# Patient Record
Sex: Female | Born: 1955 | Race: White
Health system: Northeastern US, Academic
[De-identification: ages and names within clinical notes are randomized; demographics above are authoritative.]

## PROBLEM LIST (undated history)

## (undated) DIAGNOSIS — M549 Dorsalgia, unspecified: Secondary | ICD-10-CM

## (undated) DIAGNOSIS — M48061 Spinal stenosis, lumbar region without neurogenic claudication: Secondary | ICD-10-CM

## (undated) DIAGNOSIS — G629 Polyneuropathy, unspecified: Secondary | ICD-10-CM

## (undated) DIAGNOSIS — M109 Gout, unspecified: Secondary | ICD-10-CM

## (undated) DIAGNOSIS — L570 Actinic keratosis: Secondary | ICD-10-CM

## (undated) DIAGNOSIS — K219 Gastro-esophageal reflux disease without esophagitis: Secondary | ICD-10-CM

## (undated) DIAGNOSIS — K5790 Diverticulosis of intestine, part unspecified, without perforation or abscess without bleeding: Secondary | ICD-10-CM

## (undated) DIAGNOSIS — B019 Varicella without complication: Secondary | ICD-10-CM

## (undated) DIAGNOSIS — B009 Herpesviral infection, unspecified: Secondary | ICD-10-CM

## (undated) DIAGNOSIS — G43909 Migraine, unspecified, not intractable, without status migrainosus: Secondary | ICD-10-CM

## (undated) DIAGNOSIS — J8410 Pulmonary fibrosis, unspecified: Secondary | ICD-10-CM

## (undated) DIAGNOSIS — T8859XA Other complications of anesthesia, initial encounter: Secondary | ICD-10-CM

## (undated) DIAGNOSIS — D649 Anemia, unspecified: Secondary | ICD-10-CM

## (undated) DIAGNOSIS — J329 Chronic sinusitis, unspecified: Secondary | ICD-10-CM

## (undated) DIAGNOSIS — L709 Acne, unspecified: Secondary | ICD-10-CM

## (undated) DIAGNOSIS — A64 Unspecified sexually transmitted disease: Secondary | ICD-10-CM

## (undated) DIAGNOSIS — K589 Irritable bowel syndrome without diarrhea: Secondary | ICD-10-CM

## (undated) DIAGNOSIS — L509 Urticaria, unspecified: Secondary | ICD-10-CM

## (undated) DIAGNOSIS — Z789 Other specified health status: Secondary | ICD-10-CM

## (undated) DIAGNOSIS — F32A Depression, unspecified: Secondary | ICD-10-CM

## (undated) HISTORY — DX: Irritable bowel syndrome, unspecified: K58.9

## (undated) HISTORY — DX: Migraine, unspecified, not intractable, without status migrainosus: G43.909

## (undated) HISTORY — DX: Varicella without complication: B01.9

## (undated) HISTORY — DX: Other complications of anesthesia, initial encounter: T88.59XA

## (undated) HISTORY — DX: Diverticulosis of intestine, part unspecified, without perforation or abscess without bleeding: K57.90

## (undated) HISTORY — DX: Depression, unspecified: F32.A

## (undated) HISTORY — DX: Urticaria, unspecified: L50.9

## (undated) HISTORY — DX: Polyneuropathy, unspecified: G62.9

## (undated) HISTORY — DX: Herpesviral infection, unspecified: B00.9

## (undated) HISTORY — DX: Chronic sinusitis, unspecified: J32.9

## (undated) HISTORY — DX: Acne, unspecified: L70.9

## (undated) HISTORY — DX: Pulmonary fibrosis, unspecified: J84.10

## (undated) HISTORY — DX: Dorsalgia, unspecified: M54.9

## (undated) HISTORY — DX: Unspecified sexually transmitted disease: A64

## (undated) HISTORY — DX: Anemia, unspecified: D64.9

## (undated) HISTORY — DX: Gout, unspecified: M10.9

## (undated) HISTORY — DX: Spinal stenosis, lumbar region without neurogenic claudication: M48.061

## (undated) HISTORY — DX: Gastro-esophageal reflux disease without esophagitis: K21.9

## (undated) HISTORY — DX: Actinic keratosis: L57.0

## (undated) HISTORY — DX: Other specified health status: Z78.9

## (undated) NOTE — Progress Notes (Signed)
Formatting of this note is different from the original.  Images from the original note were not included.      PREOPERATIVE H&P    NAME: [name] MRN: [id number]    DOB: 17-May-1956 AGE: 72 y.o. SEX: female   Date of Surgery:  03/11/2021  Facility:   [facility]  Procedure:   Left partial mastectomy, sentinel lymph node biopsy  Diagnosis:   Left breast invasive ductal carcinoma  Staging:   cT1 Nx M0 G1, ER positive, PR positive, HER2 equivocal  ______________________________________________________________________    Patient comes in for scheduled follow-up visit.  She she is living in [place].  In the interim her radiation went well.    Adjuvant treatment: Whole breast radiation for 4 weeks.  Endocrine therapy with tamoxifen    Arm: No swelling, cellulitis, or change in motion  Breast: No new masses, tenderness, skin change, or drainage.    Medications: Tamoxifen for which she reports night sweats  Hospitalizations: None report since her last visit  X-ray and labs: She is due for mammogram 02/12/2022 [facility].  DEXA scan has been done in [place] showing osteopenia  Family history: Unchanged since her last visit  Review of systems: 12 point review of systems updated.    Exam:  Patient is examined sitting and supine  The left breast has some mild edema present.  There is a well-healed curvilinear incision in the upper outer quadrant.  She has no palpable breast masses.  She has a well-healed left axillary incision without any masses or lymphadenopathy.  She has good range of motion to her left arm without any lymphedema.  The right breast has no palpable masses.  The right axilla is without masses or lymphadenopathy.  Neck: No cervical lymphadenopathy or thyroid nodules noted  Abdomen is soft without masses, tenderness, or hepatosplenomegaly.    Assessment:  No evidence of any recurrent left breast carcinoma  No left upper extremity lymphedema.  Normal clinical breast exam  right  Osteopenia by DEXA scan    Plan:  Monthly self examination  Annual mammogram  Lymphedema prevention strategies reviewed.  Osteoporosis prevention techniques were discussed.  We talked about vitamin D supplementation with weightbearing exercise  I have recommended vitamin E 800 units daily to minimize chronic radiation fibrosis  Follow-up in 1 years time.    Lyndle Herrlich, MD    Electronically signed by Lyndle Herrlich, MD at 02/07/2022  6:43 AM EDT

## (undated) NOTE — Progress Notes (Signed)
Formatting of this note might be different from the original.  Review of Systems   Constitutional: Negative.  Negative for chills, fever and weight loss.   HENT: Negative.  Negative for sore throat.    Respiratory: Positive for cough. Negative for shortness of breath.         Had covid recently   Cardiovascular: Negative.  Negative for chest pain.   Gastrointestinal: Positive for nausea. Negative for abdominal pain, blood in stool and vomiting.   Genitourinary: Negative.  Negative for hematuria.   Musculoskeletal: Negative.  Negative for falls.   Neurological: Positive for headaches. Negative for dizziness.   Endo/Heme/Allergies: Bruises/bleeds easily.   Psychiatric/Behavioral: Negative.  Negative for depression.     Last mammogram (if indicated):  Last colonoscopy: 09/2021    Winferd Humphrey, LPN  Electronically signed by Lyndle Herrlich, MD at 02/02/2022 12:29 PM EDT

---

## 2001-05-31 HISTORY — PX: NASAL POLYP SURGERY: SHX186

## 2005-02-16 DIAGNOSIS — K589 Irritable bowel syndrome without diarrhea: Secondary | ICD-10-CM | POA: Insufficient documentation

## 2005-02-16 DIAGNOSIS — L708 Other acne: Secondary | ICD-10-CM | POA: Insufficient documentation

## 2005-02-16 DIAGNOSIS — K219 Gastro-esophageal reflux disease without esophagitis: Secondary | ICD-10-CM | POA: Insufficient documentation

## 2005-02-16 DIAGNOSIS — K648 Other hemorrhoids: Secondary | ICD-10-CM | POA: Insufficient documentation

## 2005-02-16 DIAGNOSIS — J309 Allergic rhinitis, unspecified: Secondary | ICD-10-CM | POA: Insufficient documentation

## 2005-02-16 DIAGNOSIS — F3289 Other specified depressive episodes: Secondary | ICD-10-CM | POA: Insufficient documentation

## 2005-02-16 DIAGNOSIS — F411 Generalized anxiety disorder: Secondary | ICD-10-CM | POA: Insufficient documentation

## 2005-02-16 DIAGNOSIS — M25569 Pain in unspecified knee: Secondary | ICD-10-CM | POA: Insufficient documentation

## 2005-02-16 DIAGNOSIS — M26609 Unspecified temporomandibular joint disorder, unspecified side: Secondary | ICD-10-CM | POA: Insufficient documentation

## 2005-06-20 DIAGNOSIS — K3184 Gastroparesis: Secondary | ICD-10-CM | POA: Insufficient documentation

## 2007-06-01 HISTORY — PX: ENDOMETRIAL ABLATION: SHX621

## 2008-05-31 HISTORY — PX: INCONTINENCE SURGERY: SHX676

## 2008-05-31 HISTORY — PX: OTHER SURGICAL HISTORY: SHX169

## 2008-05-31 HISTORY — PX: RECTOCELE REPAIR: SHX761

## 2008-07-15 LAB — HM COLONOSCOPY

## 2009-03-12 ENCOUNTER — Ambulatory Visit
Admit: 2009-03-12 | Discharge: 2009-03-12 | Disposition: A | Discharge status: Home / Self Care | Payer: Self-pay | Source: Ambulatory Visit | Attending: Primary Care | Admitting: Primary Care

## 2009-03-12 DIAGNOSIS — G43909 Migraine, unspecified, not intractable, without status migrainosus: Secondary | ICD-10-CM | POA: Insufficient documentation

## 2009-05-14 ENCOUNTER — Encounter: Payer: Self-pay | Admitting: Gastroenterology

## 2009-05-15 ENCOUNTER — Ambulatory Visit: Payer: Self-pay | Admitting: Primary Care

## 2009-05-15 NOTE — Progress Notes (Addendum)
Reason For Visit   Sore throat.  HPI   She has had scratchy throat and postnasal drip on and off the past 2 weeks.    Yesterday was a little worse.  Her grandkids have had colds.  She denies   any fever, chills, cough, chest pain, wheezing, shortness of breath,   nausea, vomiting, diarrhea, or increased reflux symptoms.  She is currently   not taking Flovent or Zyrtec.  Allergies   Demerol TABS  Erythromycin Derivatives; Adverse Reaction  Sulfa Drugs.  Current Meds   Flovent HFA 110 MCG/ACT Aerosol;INHALE 2 PUFFS TWICE DAILY; Rx  Multivitamins CAPS;TAKE 1 CAPSULE DAILY.; RPT  Calcium + D 600-200 MG-UNIT Tablet;one tablet twice daily; RPT  Vitamin D 1000 UNIT Tablet;TAKE 1 TABLET DAILY.; RPT  B Complex 1 Tablet;TAKE 1 TABLET DAILY.; RPT  Magnesium 200 MG Tablet;TAKE 1 TABLET DAILY AS DIRECTED.; RPT  Aleve 220 MG Tablet;TAKE 1 TABLET DAILY PRN; RPT  GuaiFENesin 400 MG Tablet;TAKE 1 TABLET EVERY 4 HOURS AS NEEDED.; RPT  Ziana 1.2-0.025 % Gel;; RPT  Valacyclovir HCl 1 GM Tablet;; RPT  SUMAtriptan Succinate 100 MG Tablet;TAKE 1 TABLET FOR MIGRAINE RELIEF. MAY   REPEAT 2 HOURS LATER. MAXIMUM 200MG /DAY.; Rx  Dexilant 60 MG Capsule Delayed Release;TAKE 1 CAPSULE DAILY EVERY MORNING   BEFORE BREAKFAST.; Rx  Butalbital-APAP-Caffeine 50-325-40 MG Tablet;1 OR 2 TABLETS EVERY 4 TO 6   HOURS AS NEEDED FOR HEADACHE.; Rx  CeleBREX 200 MG Capsule;; RPT  Sucralfate 1 GM Tablet;TAKE 1 TABLET 4 TIMES DAILY PRN; Rx  Zolpidem Tartrate 10 MG Tablet;Take 1/2 to 1 tablet at bedtime prn sleep; Rx  Spironolactone 50 MG Tablet;TAKE 1 TABLET 3 TIMES DAILY; Rx.  ** Medication reconciliation completed. ** by Ilene Qua MPAS RPA-C.  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  Vital Signs   Recorded by Fernand Parkins on 15 May 2009 08:59 AM  BP:110/76,  RUE,  Sitting,   HR: 72 b/min,   Temp: 97.9 F,  Oral,   Weight: 129.0 lb.  Physical Exam   General:  Alert, cooperative, nontoxic, and in NAD.  Head:  Atraumatic, normocephelic, symmetrical without deformities. Sinuses   nontender.  Eyes: PERRLA EOMI bilaterally.  No conjunctival injection, ptosis, or   sclerus icterus.   Ears:  External ear exam without abnormalities. External ear canal is   patent. TMs are intact  without injection.  Nose:  Nares patent without discharge  Mouth/Throat: Gums pink without bleeding, MMM, teeth in good condition,   uvula midline, no pharyngeal  hyperemia or exudates.  Neck:   No cervical lymphadenopathy. Supple. Nontender.  Lungs:  CTA without wheezes, rales, or rhonchi.    Heart:  S1 and S2 are noted. RRR. No murmurs, rubs, or gallops.    Ext:  No clubbing, cyanosis, edema   .  POCT   RAPID STREP RESULT:  May 15, 2009   9:23AM        Rapid Antigen:  Negative  .  Assessment   Acute pharyngitis.  Plan   Likely related to allergy.  Will send confirmatory throat culture.  Will   have her start Zyrtec over-the-counter daily.  She will call if her   symptoms worsen  or don't improve.  Signature   Electronically signed by: Berton Lan  R.P.A.C.; 05/15/2009 9:24 AM EST.  Electronically signed by: Arlyss Queen  M.D.; 05/15/2009 1:23 PM EST;   Acknowledgement.

## 2009-05-17 LAB — STREP A CULTURE, THROAT

## 2009-07-02 ENCOUNTER — Ambulatory Visit: Payer: Self-pay | Admitting: Primary Care

## 2009-07-05 NOTE — Progress Notes (Signed)
 Reason For Visit   Anxiety.  HPI   The patient is a generally healthy 54 year old woman who has a history of   irritable bowel syndrome and esophageal reflux disease.  She comes in today   complaining of increasing anxiety and depression.  She is under a great   deal of stress as her daughter is going through a custody battle for her   children.  The patient had been helping to care for her grandchildren but   they are now down in [place] living 2 weeks out of the month with their   father.  The patient is concerned as she feels that their father is not a   suitable caregiver.  Unfortunately, the courts have given him joint   custody.  She is sleeping poorly.  She has tried SSRIs in the past with   variable success.  Allergies   Demerol TABS  Erythromycin Derivatives; Adverse Reaction  Sulfa Drugs.  Current Meds   Flovent HFA 110 MCG/ACT Aerosol;INHALE 2 PUFFS TWICE DAILY; Rx  Multivitamins CAPS;TAKE 1 CAPSULE DAILY.; RPT  Calcium + D 600-200 MG-UNIT Tablet;one tablet twice daily; RPT  Vitamin D 1000 UNIT Tablet;TAKE 1 TABLET DAILY.; RPT  B Complex 1 Tablet;TAKE 1 TABLET DAILY.; RPT  Magnesium 200 MG Tablet;TAKE 1 TABLET DAILY AS DIRECTED.; RPT  Aleve 220 MG Tablet;TAKE 1 TABLET DAILY PRN; RPT  GuaiFENesin 400 MG Tablet;TAKE 1 TABLET EVERY 4 HOURS AS NEEDED.; RPT  Ziana 1.2-0.025 % Gel;; RPT  Valacyclovir HCl 1 GM Tablet;; RPT  SUMAtriptan Succinate 100 MG Tablet;TAKE 1 TABLET FOR MIGRAINE RELIEF. MAY   REPEAT 2 HOURS LATER. MAXIMUM 200MG /DAY.; Rx  Dexilant 60 MG Capsule Delayed Release;TAKE 1 CAPSULE DAILY EVERY MORNING   BEFORE BREAKFAST.; Rx  Butalbital-APAP-Caffeine 50-325-40 MG Tablet;1 OR 2 TABLETS EVERY 4 TO 6   HOURS AS NEEDED FOR HEADACHE.; Rx  CeleBREX 200 MG Capsule;; RPT  Sucralfate 1 GM Tablet;TAKE 1 TABLET 4 TIMES DAILY PRN; Rx  Zolpidem Tartrate 10 MG Tablet;Take 1/2 to 1 tablet at bedtime prn sleep; Rx  Spironolactone 50 MG Tablet;TAKE 1 TABLET 3 TIMES DAILY; Rx  Amoxicillin 500 MG Capsule;TAKE 1  CAPSULE 3 TIMES DAILY.; Rx  ClonazePAM 0.5 MG Tablet;TAKE 1 TABLET TWICE DAILY PRN; Rx.  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  Vital Signs   Recorded by Greig Right on 02 Jul 2009 04:11 PM  BP:106/78,  RUE,  Sitting,   HR: 80 b/min,  R Radial,   Weight: 130.5 lb.  Physical Exam   The patient is a pleasant 54 year old woman who is anxious and tearful.    She was not examined today.  Assessment   The patient presents with situational anxiety and depression regarding the   stress over the custody of her grandchildren.  They are now in [place] and   separated from her which she finds very difficult.  Plan   1.  She was counseled regarding her anxiety and stress.  2.  Clonazepam 0.5 mg twice a day as needed.  3.  Another trial of an SSRI will be considered if she is no better.  4.  She was encouraged to see a therapist.  5.  She will call  in 2 weeks regarding her progress.  Signature   Electronically signed by: Arlyss Queen  M.D.; 07/05/2009 10:12 AM EST.

## 2009-08-13 ENCOUNTER — Ambulatory Visit
Admit: 2009-08-13 | Discharge: 2009-08-13 | Disposition: A | Discharge status: Home / Self Care | Payer: Self-pay | Source: Ambulatory Visit | Attending: Gastroenterology | Admitting: Gastroenterology

## 2009-08-16 LAB — SURGICAL PATHOLOGY

## 2009-09-01 ENCOUNTER — Ambulatory Visit: Payer: Self-pay | Admitting: Primary Care

## 2009-09-05 ENCOUNTER — Ambulatory Visit: Payer: Self-pay | Admitting: Primary Care

## 2009-09-05 DIAGNOSIS — R209 Unspecified disturbances of skin sensation: Secondary | ICD-10-CM | POA: Insufficient documentation

## 2009-09-06 NOTE — Progress Notes (Signed)
Reason For Visit   Paresthesias and follow-up of vertigo and headaches.  HPI   The patient was seen 4 days ago after an episode of vertigo with a migraine   headache.  She has not had any further episodes of vertigo and her headache   is gone.  However, she now complains of numbness and tingling of her arms   and legs.  Her tingling first started in the left arm and then went to her   legs and now is in her right arm.  She has no associated weakness.  She   denies any neck pain.  She does feel more tired.  She has been under   chronic stress over custody issues with her grandchildren.  She is on   Lexapro which is helping.  She has occasional blurred vision and she did   have one brief episode of double vision.  Allergies   Demerol TABS  Erythromycin Derivatives; Adverse Reaction  Sulfa Drugs.  Current Meds   Multivitamins CAPS;TAKE 1 CAPSULE DAILY.; RPT  Calcium + D 600-200 MG-UNIT Tablet;one tablet twice daily; RPT  Vitamin D 1000 UNIT Tablet;TAKE 1 TABLET DAILY.; RPT  B Complex 1 Tablet;TAKE 1 TABLET DAILY.; RPT  Magnesium 200 MG Tablet;TAKE 1 TABLET DAILY AS DIRECTED.; RPT  Aleve 220 MG Tablet;TAKE 1 TABLET DAILY PRN; RPT  GuaiFENesin 400 MG Tablet;TAKE 1 TABLET EVERY 4 HOURS AS NEEDED.; RPT  Ziana 1.2-0.025 % Gel;; RPT  Valacyclovir HCl 1 GM Tablet;; RPT  Spironolactone 50 MG Tablet;TAKE 1 TABLET 3 TIMES DAILY; Rx  Flovent HFA 110 MCG/ACT Aerosol;INHALE 2 PUFFS TWICE DAILY; Rx  Lexapro 10 MG Tablet;TAKE 1 TABLET DAILY.; Rx  ClonazePAM 0.5 MG Tablet;TAKE 1 TABLET TWICE DAILY PRN; Rx  Zolpidem Tartrate 10 MG Tablet;Take 1/2 to 1 tablet at bedtime prn sleep; Rx  Sucralfate 1 GM Tablet;TAKE 1 TABLET 4 TIMES DAILY PRN; Rx  CeleBREX 200 MG Capsule;; RPT  Butalbital-APAP-Caffeine 50-325-40 MG Tablet;1 OR 2 TABLETS EVERY 4 TO 6   HOURS AS NEEDED FOR HEADACHE.; Rx  Dexilant 60 MG Capsule Delayed Release;TAKE 1 CAPSULE DAILY EVERY MORNING   BEFORE BREAKFAST.; Rx  SUMAtriptan Succinate 100 MG Tablet;TAKE 1 TABLET FOR  MIGRAINE RELIEF. MAY   REPEAT 2 HOURS LATER. MAXIMUM 200MG /DAY.; Rx.  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  Vital Signs   Recorded by [facility] on 05 Sep 2009 04:50 PM  BP:120/80,  RUE,  Sitting,   HR: 80 b/min,  R Radial, Normal,   Height: 65.50 in, Weight: 130 lb, BMI: 21.3 kg/m2.  Physical Exam   The patient is a healthy-appearing 54 year old woman.  Her pupils are   equal, round and reactive to light.  Her discs are flat.  Cranial nerves II   through XII are intact.  Finger to nose testing is normal.  Motor strength   is normal.  Gait is normal.  Pinprick sensation over her arms was normal.    Her neck is nontender and supple.  Assessment   The patient presents with numbness and tingling of her extremities.  She is   neurologically intact and she has no associated weakness.  She earlier in   the  week had an episode of vertigo with her migraine headache.  She had one   brief episode of double vision.  An underlying demyelinating disorder is a   possibility.  Her symptoms also may be stress related.  Plan   1.  For now her numbness and tingling will be followed.  If her   paresthesias persist, she will be referred to neurology and an MRI scan of   her head will be considered.  2.  She is to call next week regarding her progress.  Signature   Electronically signed by: Arlyss Queen  M.D.; 09/06/2009 7:02 AM EST.

## 2009-09-16 ENCOUNTER — Ambulatory Visit
Admit: 2009-09-16 | Discharge: 2009-09-16 | Disposition: A | Discharge status: Home / Self Care | Payer: Self-pay | Source: Ambulatory Visit | Attending: Primary Care | Admitting: Primary Care

## 2009-09-16 LAB — COMPREHENSIVE METABOLIC PANEL
ALT: 25 U/L (ref 0–35)
AST: 32 U/L (ref 0–35)
Albumin: 4.5 g/dL (ref 3.5–5.2)
Alk Phos: 43 U/L (ref 35–105)
Anion Gap: 9 (ref 7–16)
Bilirubin,Total: 0.3 mg/dL (ref 0.0–1.2)
CO2: 27 mmol/L (ref 20–28)
Calcium: 9.6 mg/dL (ref 8.6–10.2)
Chloride: 97 mmol/L (ref 96–108)
Creatinine: 0.68 mg/dL (ref 0.51–0.95)
GFR,Black: >59 *
GFR,Caucasian: >59 *
Glucose: 100 mg/dL (ref 74–106)
Lab: 14 mg/dL (ref 6–20)
Potassium: 4.5 mmol/L (ref 3.3–5.1)
Sodium: 133 mmol/L (ref 133–145)
Total Protein: 7 g/dL (ref 6.3–7.7)

## 2009-09-16 LAB — T4, FREE: Free T4: 0.9 ng/dL (ref 0.9–1.7)

## 2009-09-16 LAB — T3: T3: 67 ng/dL — ABNORMAL LOW (ref 80–200)

## 2009-09-16 LAB — CBC
Hematocrit: 40 % (ref 34–45)
Hemoglobin: 13.4 g/dL (ref 11.2–15.7)
MCV: 95 fL (ref 79–95)
Platelets: 257 THOU/uL (ref 160–370)
RBC: 4.2 MIL/uL (ref 3.9–5.2)
RDW: 13.5 % (ref 11.7–14.4)
WBC: 9 THOU/uL (ref 4.0–10.0)

## 2009-09-16 LAB — VITAMIN B12: Vitamin B12: 1399 pg/mL — ABNORMAL HIGH (ref 211–946)

## 2009-09-16 LAB — TSH: TSH: 2.18 u[IU]/mL (ref 0.27–4.20)

## 2009-09-16 LAB — SEDIMENTATION RATE, AUTOMATED: Sedimentation Rate: 7 mm/h (ref 0–30)

## 2009-09-24 ENCOUNTER — Ambulatory Visit: Payer: Self-pay | Admitting: Primary Care

## 2009-11-20 ENCOUNTER — Ambulatory Visit
Admit: 2009-11-20 | Discharge: 2009-11-20 | Disposition: A | Discharge status: Home / Self Care | Payer: Self-pay | Source: Ambulatory Visit | Attending: Primary Care | Admitting: Primary Care

## 2009-11-20 LAB — CBC AND DIFFERENTIAL
Baso # K/uL: 0.1 THOU/uL (ref 0.0–0.1)
Basophil %: 0.4 % (ref 0.1–1.2)
Eos # K/uL: 0.1 THOU/uL (ref 0.0–0.4)
Eosinophil %: 0.7 % (ref 0.7–5.8)
Hematocrit: 37 % (ref 34–45)
Hemoglobin: 12.4 g/dL (ref 11.2–15.7)
Lymph # K/uL: 1.3 THOU/uL (ref 1.2–3.7)
Lymphocyte %: 10 % — ABNORMAL LOW (ref 19.3–51.7)
MCV: 96 fL — ABNORMAL HIGH (ref 79–95)
Mono # K/uL: 0.6 THOU/uL (ref 0.2–0.9)
Monocyte %: 4.6 % — ABNORMAL LOW (ref 4.7–12.5)
Neut # K/uL: 10.7 THOU/uL — ABNORMAL HIGH (ref 1.6–6.1)
Platelets: 233 THOU/uL (ref 160–370)
RBC: 3.8 MIL/uL — ABNORMAL LOW (ref 3.9–5.2)
RDW: 13.5 % (ref 11.7–14.4)
Seg Neut %: 84.3 % — ABNORMAL HIGH (ref 34.0–71.1)
WBC: 12.7 THOU/uL — ABNORMAL HIGH (ref 4.0–10.0)

## 2009-11-20 LAB — COMPREHENSIVE METABOLIC PANEL
ALT: 20 U/L (ref 0–35)
AST: 22 U/L (ref 0–35)
Albumin: 3.9 g/dL (ref 3.5–5.2)
Alk Phos: 35 U/L (ref 35–105)
Anion Gap: 7 (ref 7–16)
Bilirubin,Total: 0.4 mg/dL (ref 0.0–1.2)
CO2: 29 mmol/L — ABNORMAL HIGH (ref 20–28)
Calcium: 8.6 mg/dL (ref 8.6–10.2)
Chloride: 97 mmol/L (ref 96–108)
Creatinine: 0.72 mg/dL (ref 0.51–0.95)
GFR,Black: >59 *
GFR,Caucasian: >59 *
Glucose: 77 mg/dL (ref 74–106)
Lab: 8 mg/dL (ref 6–20)
Potassium: 3.7 mmol/L (ref 3.3–5.1)
Sodium: 133 mmol/L (ref 133–145)
Total Protein: 6.1 g/dL — ABNORMAL LOW (ref 6.3–7.7)

## 2009-11-20 LAB — SEDIMENTATION RATE, AUTOMATED: Sedimentation Rate: 11 mm/h (ref 0–30)

## 2009-11-20 LAB — CK: CK: 246 U/L — ABNORMAL HIGH (ref 34–145)

## 2009-11-20 LAB — CRP: CRP: 85 mg/L — ABNORMAL HIGH (ref 0–10)

## 2009-11-24 LAB — VITAMIN D
25-OH VIT D2: <4 ng/mL
25-OH VIT D3: 53 ng/mL
25-OH Vit Total: 53 ng/mL (ref 30–80)

## 2009-12-12 ENCOUNTER — Ambulatory Visit
Admit: 2009-12-12 | Discharge: 2009-12-12 | Disposition: A | Discharge status: Home / Self Care | Payer: Self-pay | Source: Ambulatory Visit | Attending: Primary Care | Admitting: Primary Care

## 2009-12-12 LAB — CK: CK: 321 U/L — ABNORMAL HIGH (ref 34–145)

## 2009-12-12 LAB — CBC AND DIFFERENTIAL
Baso # K/uL: 0.1 THOU/uL (ref 0.0–0.1)
Basophil %: 1 % (ref 0.1–1.2)
Eos # K/uL: 0.4 THOU/uL (ref 0.0–0.4)
Eosinophil %: 4.3 % (ref 0.7–5.8)
Hematocrit: 42 % (ref 34–45)
Hemoglobin: 13.7 g/dL (ref 11.2–15.7)
Lymph # K/uL: 2.3 THOU/uL (ref 1.2–3.7)
Lymphocyte %: 28.3 % (ref 19.3–51.7)
MCV: 98 fL — ABNORMAL HIGH (ref 79–95)
Mono # K/uL: 0.5 THOU/uL (ref 0.2–0.9)
Monocyte %: 6.6 % (ref 4.7–12.5)
Neut # K/uL: 4.8 THOU/uL (ref 1.6–6.1)
Platelets: 326 THOU/uL (ref 160–370)
RBC: 4.3 MIL/uL (ref 3.9–5.2)
RDW: 13.5 % (ref 11.7–14.4)
Seg Neut %: 59.8 % (ref 34.0–71.1)
WBC: 8.1 THOU/uL (ref 4.0–10.0)

## 2009-12-12 LAB — SEDIMENTATION RATE, AUTOMATED: Sedimentation Rate: 10 mm/h (ref 0–30)

## 2009-12-12 LAB — CRP: CRP: 1 mg/L (ref 0–10)

## 2009-12-15 LAB — ANCA SCREEN: ANCA Screen: NEGATIVE

## 2009-12-25 ENCOUNTER — Ambulatory Visit: Payer: Self-pay | Admitting: Primary Care

## 2010-01-04 NOTE — Progress Notes (Signed)
Reason For Visit   Lateral forearm pain and cramping of her toes and feet.  HPI   The patient has had tingling of her arms and legs over the last 3-4 months.    In April she did have normal nerve conduction studies.  No etiology has   been found for her paresthesias.  Her workup has been negative except for   an elevated CK level.  She does feel that the tingling in her arms is   getting better.     However, she now has pain over her lateral forearms that radiates into her   fingers and she has cramping of her toes, feet and lower legs.  She has   trouble exercising because of pain.  Her energy level is decreased and she   tires easily.  Her muscles feel tight but she does try to do daily   stretching exercises.  She tapered off of gabapentin because of drowsiness.    She is concerned about her symptoms as there is a family history of   fascioscapulohumeral muscular dystrophy.  Allergies   Demerol TABS  Erythromycin Derivatives; Adverse Reaction  Sulfa Drugs.  Current Meds   Multivitamins CAPS;TAKE 1 CAPSULE DAILY.; RPT  Calcium + D 600-200 MG-UNIT Tablet;one tablet twice daily; RPT  Vitamin D 1000 UNIT Tablet;TAKE 1 TABLET DAILY.; RPT  B Complex 1 TABS;TAKE 1 TABLET DAILY.; RPT  Magnesium 200 MG Tablet;TAKE 1 TABLET DAILY AS DIRECTED.; RPT  Aleve 220 MG Tablet;TAKE 1 TABLET DAILY PRN; RPT  GuaiFENesin 400 MG Tablet;TAKE 1 TABLET EVERY 4 HOURS AS NEEDED.; RPT  Ziana 1.2-0.025 % Gel;; RPT  Valacyclovir HCl 1 GM Tablet;; RPT  SUMAtriptan Succinate 100 MG Tablet;TAKE 1 TABLET FOR MIGRAINE RELIEF. MAY   REPEAT 2 HOURS LATER. MAXIMUM 200MG /DAY.; Rx  Dexilant 60 MG Capsule Delayed Release;TAKE 1 CAPSULE DAILY EVERY MORNING   BEFORE BREAKFAST.; Rx  Butalbital-APAP-Caffeine 50-325-40 MG Tablet;1 OR 2 TABLETS EVERY 4 TO 6   HOURS AS NEEDED FOR HEADACHE.; Rx  CeleBREX 200 MG Capsule;; RPT  Sucralfate 1 GM Tablet;TAKE 1 TABLET 4 TIMES DAILY PRN; Rx  Zolpidem Tartrate 10 MG Tablet;Take 1/2 to 1 tablet at bedtime prn sleep;  Rx  Spironolactone 50 MG Tablet;TAKE 1 TABLET 3 TIMES DAILY; Rx  ClonazePAM 0.5 MG Tablet;TAKE 1 TABLET TWICE DAILY PRN; Rx  NexIUM 40 MG Capsule Delayed Release;; RPT  Mometasone Furoate 0.1 % Cream;; RPT  Aczone 5 % Gel;; RPT  Pantoprazole Sodium 40 MG Tablet Delayed Release;TAKE 1 TABLET DAILY.; Rx  Flovent HFA 110 MCG/ACT Aerosol;INHALE 2 PUFFS TWICE DAILY; Rx  Gabapentin 100 MG Capsule;ONE CAPSULE AT BEDTIME AND MAY INCREASE TO TWO AT   BEDTIME IF NECESSARY; Rx  Lexapro 10 MG Tablet;TAKE 1 TABLET DAILY.; Rx.  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  Vital Signs   Recorded by RACE,JENNIFER on 25 Dec 2009 01:10 PM  BP:116/80,  LUE,  Sitting,   HR: 72 b/min,  L Radial,   Height: 65.5 in, Weight: 126.5 lb, BMI: 20.7 kg/m2.  Physical Exam   The patient is a healthy-appearing 54 year old woman.  Motor strength is  normal.  She has no focal tenderness.  Assessment   The patient is a 54 year old woman who initially presented with   paresthesias of her arms and legs.  She now has pain in her forearms with   cramping of her toes, feet and lower legs.  Her workup has been negative   except for an elevated CK level.  She does have a family history of   muscular dystrophy.  Plan   1.  Second neurology opinion.  2.  She will re-start gabapentin at 100 mg one at nighttime.  3.  Her CK level will be followed.  4.  She is to return after she sees the neurologist.  Signature   Electronically signed by: Arlyss Queen  M.D.; 01/04/2010 11:23 AM EST.

## 2010-04-08 ENCOUNTER — Ambulatory Visit
Admit: 2010-04-08 | Discharge: 2010-04-08 | Disposition: A | Discharge status: Home / Self Care | Payer: Self-pay | Source: Ambulatory Visit | Attending: Surgery | Admitting: Surgery

## 2010-04-08 LAB — TIBC
Iron: 109 ug/dL (ref 34–165)
TIBC: 288 ug/dL (ref 250–450)
Transferrin Saturation: 38 % (ref 15–50)

## 2010-04-08 LAB — FERRITIN: Ferritin: 50 ng/mL (ref 10–120)

## 2010-04-08 LAB — T4, FREE: Free T4: 0.9 ng/dL (ref 0.9–1.7)

## 2010-04-08 LAB — TSH: TSH: 1.7 u[IU]/mL (ref 0.27–4.20)

## 2010-04-08 LAB — VITAMIN B12: Vitamin B12: 1056 pg/mL — ABNORMAL HIGH (ref 211–946)

## 2010-04-08 LAB — T3: T3: 76 ng/dL — ABNORMAL LOW (ref 80–200)

## 2010-04-08 LAB — FOLATE: Folate: 10.2 ng/mL (ref >=4.6)

## 2010-04-09 LAB — DHEA-SULFATE: DHEA Sulfate: 96 ug/dL (ref 26–200)

## 2010-04-10 LAB — VITAMIN D
25-OH VIT D2: <4 ng/mL
25-OH VIT D3: 62 ng/mL
25-OH Vit Total: 62 ng/mL (ref 30–80)

## 2010-06-05 ENCOUNTER — Ambulatory Visit: Payer: Self-pay | Admitting: Obstetrics and Gynecology

## 2010-06-08 NOTE — Miscellaneous (Unsigned)
 Continuity of Care Record  Created: todo  From: Arlyss Queen  From:   From: [employer], EHR v10.2.7.53  To: Deeann Cree  Purpose: Patient Use;       Problems  Diagnosis: Acne (706.1)   Diagnosis: Allergic Rhinitis (477.9)   Diagnosis: Anxiety (300.00)   Diagnosis: Arthritis (716.90)   Diagnosis: Asthma (493.90)   Problem: History of Back Pain Resolved  Diagnosis: Chronic Constipation (564.00)   Diagnosis: Depression (311)   Diagnosis: Esophageal Reflux (530.81)   Problem: Family history of Fascioscapulohumeral Progressive Muscular   Dystrophy  Diagnosis: Gastroparesis (536.3)   Diagnosis: Internal Hemorrhoids (455.0)   Diagnosis: Irritable Bowel Syndrome (564.1)   Diagnosis: Joint Pain, Localized In The Knee (719.46)   Diagnosis: Migraine Headache (346.90)   Diagnosis: Temporomandibular Joint-pain Dysfunction Syndrome; Right   (524.60)   Diagnosis: Tingling (Paresthesia) (782.0)   Diagnosis: Urticaria (708.9)     Family History  Family history of Breast Cancer (V16.3)   Family history of Diabetes Mellitus (V18.0)   Family history of Fascioscapulohumeral Progressive Muscular Dystrophy  Family history of Heart Disease (V17.49)   Family history of Hypertension (V17.49)   Family history of Stroke Syndrome (V17.1)   Family history of Venous Thrombosis  Paternal history of Wegener's Granulomatosis    Social History  Marital History - Currently Married  Occupation:  Alcohol Use  Sexually Active  No History of Tobacco Use  No History of Drug Use  No History of Physical Abuse  No History of Sexually Abused    Alerts  Allergy - Demerol TABS   AdverseReaction - Erythromycin Derivatives   Allergy - Sulfa Drugs     Medications  Adrenal Complex CAPS; TAKE AS DIRECTED. ; RPT   B Complex 1 TABS; TAKE 1 TABLET DAILY. ; RPT   DHEA 25 MG Capsule; TAKE AS DIRECTED. ; RPT   Escitalopram Oxalate 20 MG Tablet; TAKE 1 TABLET DAILY. ; Rx   HydrOXYzine HCl 25 MG Tablet ; RPT   Lexapro 10 MG Tablet;  TAKE 1 TABLET DAILY. ; Rx   Lidocaine Viscous 2 % Solution ; RPT   Lyrica 25 MG Capsule ; RPT   Magnesium 200 MG Tablet; TAKE 1 TABLET DAILY AS DIRECTED. ; RPT   Metoclopramide HCl 5 MG Tablet ; RPT   Pantoprazole Sodium 40 MG Tablet Delayed Release; TAKE 1 TABLET DAILY. ; Rx   Progesterone Micronized 200 MG Capsule ; RPT   Prometrium 100 MG Capsule ; RPT   Valtrex 500 MG Tablet; TAKE 1 TABLET DAILY. ; RPT     Immunizations  Td   Influenza   Influenza   Influenza

## 2010-08-14 ENCOUNTER — Ambulatory Visit: Payer: Self-pay | Admitting: Obstetrics and Gynecology

## 2010-08-28 ENCOUNTER — Encounter: Payer: Self-pay | Admitting: Obstetrics and Gynecology

## 2010-08-28 ENCOUNTER — Ambulatory Visit: Payer: Self-pay | Admitting: Obstetrics and Gynecology

## 2010-08-28 NOTE — Progress Notes (Addendum)
 Reason For Visit   Evaluation of a rectocele and stress urinary incontinence.  HPI   Dr. Freddi Che, NP     [name] is a 55 year old G2 P2 Caucasian woman presenting for   an initial office evaluation of dyspareunia, stress urinary incontinence   and fecal incontinence.  She has been having these symptoms for several   years.  The most bothersome problem is fecal incontinence.  She reports   having increased fecal incontinence since reconstructive pelvic surgery in   09-14-08.  She has passed formed stool without sensation.  She reports having   regular bowel movements on a daily basis.  Later in the day, she has gone   to the restroom and noticed the fecal incontinence.  This occurs almost on   a daily basis.  Regarding the dyspareunia, she has pain at the vaginal introitus with   insertion and intercourse.  She is currently going to physical therapy with   improvement.  The most tender region is the posterior vaginal wall.  In   addition, she complains of a superficial vaginal irritation.  She has had   this problem since Sep 14, 2008.  Leakage of urine with activity occurs once or twice a week.  She has   associated the incontinence episodes with walking.  She feels as if she can   hold her urine if needed.  She has the sensation of incomplete bladder   emptying secondary to the urge to void shortly after a primary void.  She   does not have frequent urinary tract infections and considers urinary   incontinence the least bothersome of all her complaints.  Of note, in May of 2010 she had a TOT, posterior colporrhaphy, external   sphincteroplasty and a cystoscopy.  Voiding Diary   Voiding Diary: Completed and represented a normal day   Number of Day Voids: 18  Number of Night Voids: 1  Voided volumes:3 ounces to 21 ounces Typical voided volumes:8 ounces  Void Intervals: one hour  Total Intake: 127 ounces  Total Void: 150 ounces  Urge at void: yes  Leakage: not documented  24 hr Pad weights: not  done.  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  PMH   Back Pain Resolved.  PSH   Anal Sphincteroplasty 24 Oct 2008  Rectocele Repair 24 Oct 2008  Skin Debridement Genitalia 07 Nov 2008; vulvar at site of previous   posterior repair/perineoplasty  Vaginal Sling Operation For Stress Incontinence 24 Oct 2008; transobturator.  Allergies   Demerol TABS  Erythromycin Derivatives; Adverse Reaction  Sulfa Drugs.  Current Meds   Multivitamins CAPS;TAKE 1 CAPSULE DAILY.; RPT  Calcium + D 600-200 MG-UNIT Tablet;one tablet twice daily; RPT  Vitamin D 1000 UNIT Tablet;TAKE 1 TABLET DAILY.; RPT  B Complex 1 TABS;TAKE 1 TABLET DAILY.; RPT  Magnesium 200 MG Tablet;TAKE 1 TABLET DAILY AS DIRECTED.; RPT  Aleve 220 MG Tablet;TAKE 1 TABLET DAILY PRN; RPT  GuaiFENesin 400 MG Tablet;TAKE 1 TABLET EVERY 4 HOURS AS NEEDED.; RPT  Ziana 1.2-0.025 % Gel;; RPT  SUMAtriptan Succinate 100 MG Tablet;TAKE 1 TABLET FOR  MIGRAINE RELIEF. MAY   REPEAT 2 HOURS LATER. MAXIMUM 200MG /DAY.; Rx  Dexilant 60 MG Capsule Delayed Release;TAKE 1 CAPSULE DAILY EVERY MORNING   BEFORE BREAKFAST.; Rx  Butalbital-APAP-Caffeine 50-325-40 MG Tablet;1 OR 2 TABLETS EVERY 4 TO 6   HOURS AS NEEDED FOR HEADACHE.; Rx  CeleBREX 200 MG Capsule;; RPT  Sucralfate 1 GM Tablet;TAKE 1 TABLET 4 TIMES DAILY PRN; Rx  Zolpidem Tartrate 10 MG Tablet;Take 1/2 to 1 tablet at bedtime prn sleep; Rx  Spironolactone 50 MG Tablet;TAKE 1 TABLET 3 TIMES DAILY; Rx  ClonazePAM 0.5 MG Tablet;TAKE 1 TABLET TWICE DAILY PRN; Rx  NexIUM 40 MG  Capsule Delayed Release;; RPT  Mometasone Furoate 0.1 % Cream;; RPT  Aczone 5 % Gel;; RPT  Pantoprazole Sodium 40 MG Tablet Delayed Release;TAKE 1 TABLET DAILY.; Rx  Gabapentin 100 MG Capsule;TAKE 2 CAPSULES BY MOUTH AT BEDTIME; Rx  Gabapentin 300 MG Capsule;; RPT  Prometrium 100 MG Capsule;; RPT  Gabapentin 300 MG Capsule;TAKE 3 CAPSULE BEDTIME; Rx  Valacyclovir HCl 1 GM Tablet;TAKE 1 TABLET DAILY; Rx  Lexapro 10 MG Tablet;TAKE 1 TABLET DAILY.; Rx  Flovent HFA 110 MCG/ACT Aerosol;INHALE 2 PUFFS TWICE DAILY; Rx  Azithromycin 250 MG Tablet;TAKE 2 TABLETS ON DAY 1 THEN TAKE 1 TABLET A DAY   FOR 4 DAYS.; Rx  Cyclobenzaprine HCl 5 MG Tablet;TAKE 1 TABLET 3 TIMES DAILY AS NEEDED.; Rx  Hydrocodone-Acetaminophen 5-500 MG Tablet;TAKE 1 TABLET EVERY 4-6 HOURS   PRN; Rx.  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  She is married and is   sexually active.  She does not smoke tobacco or use recreational drugs.    She drinks 3-5 glasses of wine per week.  She considers herself physically   inactive and walks only on occasion.  She is able to ambulate without the   assistance of a walker or a cane.  Family Hx   Family history of Fascioscapulohumeral Progressive Muscular Dystrophy  Paternal history of Wegener's Granulomatosis.  ROS   General: complains of fatigue.  complains of weight gain.  No weight loss.    No night sweats.  No fevers.  HEENT:  complains of vision change. No hearing loss. No problems with sense   of smell. complains of difficulty swallowing.    Cardiovascular her: complains of occasional chest pain.complains of   occasional palpitations.  No  syncope.    Respiratory: No shortness of breath. No chronic cough.  No wheezing.  Gastriointestinal: No nausea. No vomiting. No chronic diarrhea.  complains   of occasional constipation. complains of bloating. complains of abdominal   pain. complains of indigestion. No blood in stool color or change in stool.   Genitourinary: As per  HPI.  Musculoskeletal  system: complains of muscle pain. experiences joint pain.   complains of muscle weakness. No leg swelling.  Dermatology: No skin rashes. No skin lesions.  No easy bruising. No   bleeding.  Neurological system: No seizures. No numbness of extremities. No weakness   of extremities. No headaches.    Psychiatry: No depression.  No anxiety.    Endocrine: No excessive thirst. No feeling too hot. complains of feeling   too cold.   Hematologic:  has seasonal allergies. No lymphadenopathy.  No immune   diseases.  Vital Signs   Recorded by Pfluke,Kay on 28 Aug 2010 09:16 AM  BP:110/70,  RUE,  Sitting,   Height: 65 in, Weight: 134 lb, BMI: 22.3 kg/m2,   Pain Scale: 5.  Physical Exam   General: No apparent distress. Alert and oriented x 3.   Heart: Regular rate and rhythm.  Lungs: Bilaterally clear.  Abdomen: Soft.  Nontender.  Nondistended.   Sacral promontory palpated on   physical exam.  Vulva: Normal external genitalia.  Symmetric labia.  No skin lesions or   pigmentation changes.   Vagina: No epithelial lesions or pigmentation changes.  Able to insert one   finger in the introitus with no pain.  With the insertion of 2 fingers in   the introitus she reports pain deeper in the vagina.  Her pelvic floor   muscles were relaxed and nontender.  She experienced pain on the posterior   vaginal wall proximal to the introitus.  She had general vaginal tenderness   with no localized pain.  On speculum exam the vaginal mucosa appeared   healthy and without erythema or excoriations.  no pelvic organ prolapse  Adenexa: No masses.  Nontender.  Rectal exam: No external hemorrhoidal tissue, good resting tone,  sphincter   contractility was absent.  Sphincter felt intact circumferentially.  A good   perineal body suspension.  Neurological:   Motor and sensory exams grossly normal.    Cough Stress Test:  negative with cough in supine position.  Urethral Mobility: present by visual assessment  Void: 400 cc  Postvoid  Residual Volume:  147 cc by bladder scanner, 40 cc by catheterized   specimen     LABORATORY: Because of the elevated postvoid residual by bladder scanner we   assessed her for urinary retention.  She voided 400 cc.  Using sterile   tec

## 2010-09-04 ENCOUNTER — Encounter: Payer: Self-pay | Admitting: Primary Care

## 2010-09-04 ENCOUNTER — Ambulatory Visit: Payer: Self-pay | Admitting: Primary Care

## 2010-09-04 LAB — HM HIV SCREENING OFFERED

## 2010-09-05 NOTE — Progress Notes (Signed)
 Reason For Visit   Rash, chest pain and follow-up of neuropathy.  HPI   The patient complains of persistent paresthesias.  She has been followed by   neurology and may have a small fiber neuropathy.  She was started on Lyrica   but she was unable to tolerate it.  She also tried gabapentin in the past   without help.  She has an appointment scheduled with neuromuscular   neurology.     She has had intermittent scattered red patches with mild itching over the   last 2 months.  At times, these patches look like hives.  She is on Zyrtec   with some help.  She has been on a number of different herbal medications.     She complains of nonexertional chest pain that radiates to her right   scapula and jaw.  She has mild nausea but no heartburn.  Her pain is worse   after eating certain foods.  She started back on pantoprazole but this has   not helped.  She has intermittent episodes during the day.  She was   constipated but she is now having diarrhea.  She has a history of   gastroparesis and she was given metoclopramide by her gastroenterologist.    She complains of increased gas.  Imodium has been helping her diarrhea.     She has been more depressed and she wonders about increasing Lexapro.  Allergies   Demerol TABS  Erythromycin Derivatives; Adverse Reaction  Sulfa Drugs.  Current Meds   B Complex 1 TABS;TAKE 1 TABLET DAILY.; RPT  Magnesium 200 MG Tablet;TAKE 1 TABLET DAILY AS DIRECTED.; RPT  Pantoprazole Sodium 40 MG Tablet Delayed Release;TAKE 1 TABLET DAILY.; Rx  Prometrium 100 MG Capsule;; RPT  Lexapro 10 MG Tablet;TAKE 1 TABLET DAILY.; Rx  Valtrex 500 MG Tablet;TAKE 1 TABLET DAILY.; RPT  Adrenal Complex CAPS;TAKE AS DIRECTED.; RPT  DHEA 25 MG Capsule;TAKE AS DIRECTED.; RPT  Escitalopram Oxalate 20 MG Tablet;TAKE 1 TABLET DAILY.; Rx.  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  Vital Signs   Recorded by Roberts,Cornelia on 04 Sep 2010 11:41 AM  BP:116/74,  LUE,  Sitting,   Weight: 135 lb.  Physical Exam   The patient is a pleasant 55 year old woman who appears well.  Her lungs   are clear.  Cardiac exam shows a regular rhythm.  Her abdomen is soft with   good bowel sounds and there is mild epigastric tenderness without mass or   hepatosplenomegaly.  Her skin is clear.  Assessment   ASSESSMENT/Plan:     1.  Intermittent urticaria.  Etiology is unclear but she recently has been   on a number of different herbal medications.  --She will continue Zyrtec as needed.  --She was encouraged to decrease her herbal medications to see if her hives   resolve.     2.  Chest pain is likely secondary to GERD.  However, esophageal spasm and   gallbladder disease are other possibilities.  --Abdominal ultrasound will be scheduled.  --GI follow-up.  3.  Mild worsening of chronic depression.  --She will increase Lexapro to 20 mg a day.     4.  Paresthesias questionably secondary to a small fiber neuropathy.  --Neurology follow-up.  Signature   Electronically signed by: Arlyss Queen  M.D.; 09/05/2010 3:45 PM EST.

## 2010-10-08 ENCOUNTER — Encounter: Payer: Self-pay | Admitting: Neurology

## 2010-10-08 ENCOUNTER — Ambulatory Visit: Payer: Self-pay | Admitting: Neurology

## 2010-10-08 ENCOUNTER — Other Ambulatory Visit: Payer: Self-pay | Admitting: Primary Care

## 2010-10-08 ENCOUNTER — Telehealth: Payer: Self-pay

## 2010-10-08 VITALS — BP 106/62 | HR 72 | Wt 139.1 lb

## 2010-10-08 DIAGNOSIS — G629 Polyneuropathy, unspecified: Secondary | ICD-10-CM

## 2010-10-08 DIAGNOSIS — M79609 Pain in unspecified limb: Secondary | ICD-10-CM

## 2010-10-08 MED ORDER — DULOXETINE HCL 20 MG PO CPEP *I*
20.0000 mg | DELAYED_RELEASE_CAPSULE | Freq: Two times a day (BID) | ORAL | Status: DC
Start: 2010-10-08 — End: 2011-03-02

## 2010-10-08 MED ORDER — DULOXETINE HCL 20 MG PO CPEP *I*
20.0000 mg | DELAYED_RELEASE_CAPSULE | Freq: Two times a day (BID) | ORAL | Status: DC
Start: 2010-10-08 — End: 2010-10-08

## 2010-10-08 NOTE — Progress Notes (Addendum)
 History of Present Illness:   [name] is a 55 year old woman referred by Dr. Logan Bores for consultation regarding neuropathy.   He questions the possibility of small fiber sensory neuropathy with autonomic neuropathy.    In the context of underlying depression, anxiety, and past history of abdominal pain, she began to develop flares of paresthesias and pain.  In December 2010 she has a very stressful life event where her grandchildren were moved from her custody to [place]; this precipitated a depression for which she started Lexapro in February 2011.  In April 2011, she had her first episode of tingling following an episode of vertigo while driving. This is described as tingling beginning in her left upper extremity which then progressed to her distal legs and bilateral hands.  Since that time she has had constant distal leg and foot pain and tingling, with intermittent flares of worsening tingling followed by days of drowsiness. Her last flare 2 weeks ago and is described as feeling like her lower legs were on fire, then within an hour had tingling in all 10 fingers with painful fingertips and feet. Her flares can last days at times.  She believes that her flares are often triggered by illness, such as a URI, or by exercise.   She does not note pain during the exercise but for days afterward.  She also gets patches of tingling and burning on her torso, thighs, arms, or back that last for minutes at a time.     She reports episodic "gastroparesis" 3-4 times per year for 4-5 years. She states that she has had a positive gastric emptying study.   She has a full feeling while trying to swallow. She has difficulty pushing stool out following an anoplasty.  She feels generally fatigued.  Denies shortness of breath or slurred speech. She had a recent bout of hives.  She has dry eyes and dry mouth.     She is not currently on a neuropathic pain medication. Gabapentin 600 mg at bedtime helped her night time pain but  causes her to have fluid retention, and was associated with a 16 pound weight gain.   She tried Lyrica 25mg  once daily, and experienced difficulty sleeping, fluid retention and "feeling sluggish". She tried a topical oil which was somewhat helpful. She cannot tolerate NSAIDs. She tried Vicodin which was somewhat helpful, but which she rarely uses.    Testing thus far as revealed a normal serum protein electrophoresis, negative ANA, normal anti-SSA and anti-SSB antibodies, normal B12, and normal glucose testing.    In August 2011, she had non-contrasted brain MRI at [facility]  & [facility] reported to show single punctate T2 focus in centrum semi-ovale.    In August 2011, she had cervical MRI at [facility]  & [facility] reported to show left sided paracentral disk/osteophye complex at C5/6 causing moderate to severe central stenosis and severe left foramina stenosis, left paracentral disk at C6/7 causing moderate central stenosis and moderate left foramina stenosis.    In March 2012, she had lumbar spine MRI at [facility]  & [facility] reported to show moderate spondylosis without significant stenosis.      EMG/NCS on the outside reported to be normal.    Medications:     Current Outpatient Prescriptions   Medication   . B Complex Vitamins (VITAMIN B COMPLEX) capsule   . progesterone (PROMETRIUM) 100 MG capsule   . cyclobenzaprine (FLEXERIL) 5 MG tablet   . mometasone (ELOCON) 0.1 % cream   .  escitalopram (LEXAPRO) 10 MG tablet   . butalbital-acetaminophen-caffeine (FIORICET, ESGIC) 50-325-40 MG per tablet   . Dexlansoprazole (DEXILANT) 60 MG capsule   . ZIANA gel   . valacyclovir (VALTREX) 1 GM tablet   . B Complex Vitamins (B COMPLEX 1) tablet   . Magnesium 200 MG TABS   . guaifenesin (HUMIBID E) 400 MG TABS   . SUMAtriptan (IMITREX) 100 MG tablet   . fluticasone (FLOVENT HFA) 110 MCG/ACT inhaler   . DULoxetine (CYMBALTA) 20 MG DR capsule   . HYDROcodone-acetaminophen (VICODIN) 5-500 MG per tablet   . azithromycin (ZITHROMAX) 250 MG tablet   .  gabapentin (NEURONTIN) 300 MG capsule   . gabapentin (NEURONTIN) 300 MG capsule   . gabapentin (NEURONTIN) 100 MG PO   . pantoprazole (PROTONIX) 40 MG EC tablet   . NEXIUM 40 MG capsule   . ACZONE 5 % topical gel   . clonazePAM (KLONOPIN) 0.5 MG tablet   . spironolactone (ALDACTONE) 50 MG tablet   . CELEBREX 200 MG capsule   . zolpidem (AMBIEN) 10 MG tablet   . Multiple Vitamin (MULTIVITAMIN) capsule   . Calcium Carbonate-Vitamin D (CALCIUM + D) 600-200 MG-UNIT TABS   . Cholecalciferol (VITAMIN D) 1000 UNIT tablet   . naproxen sodium (ALEVE) 220 MG tablet   . sucralfate (CARAFATE) 1 GM tablet       Past Medical History:     Past Medical History   Diagnosis Date   . Conversion for Medical History      Conversion Data From [id number] - ^History of Back Pain;Resolved   . Migraine    . GERD (gastroesophageal reflux disease)    . Depression        Family History:     Family History   Problem Relation Age of Onset   . Conversion Other      [id number] Progressive Muscular Dystrophy^359.1^Active^   . Conversion Other      [id number]^LKGMWNU'U Granulomatosis^446.4^Active^   . Other Father      numbness and pain in limbs   . Other Sister      numbness and pain in limbs       Social History:     History     Social History   . Marital Status: Married     Spouse Name: N/A     Number of Children: N/A   . Years of Education: N/A     Social History Main Topics   . Smoking status: Former Smoker     Types: Cigarettes     Quit date: 10/07/1980   . Smokeless tobacco: Never Used    Comment: rare cigarettes   . Alcohol Use: 3.5 oz/week     7 drink(s) per week   . Drug Use: No   . Sexually Active: Yes -- Female partner(s)     Other Topics Concern   . None     Social History Narrative   . None       Review of systems:   A comprehensive review of systems was negative.     Physical Examination:   Blood pressure 106/62, pulse 72, weight 63.095 kg (139 lb 1.6 oz).    Anitria Andon  appears well nourished at this visit and is  in no apparent distress.  She is alert and fully oriented and has an excellent fund of knowledge.  She participates appropriately in all aspects of clinical history and neurological examination.    HEENT: supple neck, no lymphadenopathy.  Thyroid  normal.  CV: RRR, no murmur, no orthostatic changes from supine to standing, HR increases only by 6 bpm and BP unchanged.   CH: clear to auscultation bilaterally  Extremities:  No edema.  No dystrophic skin change.  No foot deformity.  Slight distal, blanching erythema in feet.      CN: Pupils are equal, round, and reactive to light; they constrict 7 to 3 mm in dark and 3 to 2.5 mm in light.  Optic disk margins are clear, spontaneous venous pulsations are present.  Visual fields are full.  Extra ocular movements are intact without nystagmus.  Fully buries lashes on eye closure.  Able to blow out cheeks and pucker.  No fasciculations or atrophy of the tongue.  Facial sensation is normal and corneal reflexes intact.  Palate elevation is full and symmetrical.  Strength is full on head turn and neck flexion.  Hearing is intact.    Motor (R/L): Muscle bulk appears normal including foot intrinsics.  No fasciculations.  Muscle tone is normal.  Neck extension 5.  Shoulder ROM is full.  Shoulder abduction 5/5, forward flexion 5/5, external rotation 5/5.  Elbow flexion 5/5, extension 5/5.  Wrist flexion 5/5, extension 5/5.  Finger flexion 5/5, extension 5/5, abduction 5/5.  Thumb abduction 5/5.  Hip flexion 5/5, abduction 5/5.  Knee flexion 5/5, extension 5/5.  Ankle dorsiflexion 5/5, plantarflexion 5/5, inversion 5/5, eversion 5/5.  Great toe extension 5/5.    She can walk on toes and heels.  Can rise from squatted position without using arms.  Can rise from seated position without using arms.    Sensation: There is a stocking gradient of intensity of sharp and cold perception to the mid-shins bilaterally. Romberg is negative.  Light touch mildly decreased over the distal toe.  Vibration with a Rydel-Seiffer tuning fork is 8/8 at bilateral great toes and position sense is intact.  Timed vibration with 128 hz is 15 seconds to extinction at the great toe.    Reflexes (R/L): Biceps 2+/2+, tr

## 2010-10-08 NOTE — Progress Notes (Deleted)
 Subjective:     Patient ID: [name] is a 55 y.o. female.    HPI [name] is a 55 year old woman referred by Dr. Logan Bores for consultation regarding neuropathy.   In December 2010 she has a very stressful life event where her grandchildren were moved from her custody to [place]; this precipitated a depression for which she started Lexapro in February 2011. In April 2011, she had her first episode of tingling following an episode of vertigo while driving. This is described as tingling beginning in her left upper extremity which then progressed to her distal legs and bilateral hands. Since that time she has had constant distal leg and foot pain and tingling, with intermittent flares of worsening tingling followed by days of drowsiness. Her last flare 2 weeks ago and is described as feeling like her lower legs were on fire, then within an hour had tingling in all 10 fingers with painful fingertips and feet. Her flares can last days at times. Her flares are often triggered by illness, such as a URI, or by exercise. She also gets patches of tingling and burning on her torso, thighs, arms, or back that last for minutes at a time.     Gastroparesis 3-4 times per year for 4-5 years. She states that she has had a positive gastric emptying study. She has a full feeling while trying to swallow. She has difficulty pushing stool out following an anoplasty. She feels generally fatigued. Denies shortness of breath or slurred speech. She had a recent bout of hives. She has dry eyes and dry mouth.   She is not currently on a neuropathic pain medication. Gabapentin causes her to have intolerable fluid retention, with a 16 pound weight gain after being on it. She tried Lyrica 25mg  once daily, and experienced difficulty sleeping, fluid retention and "feeling sluggish". She tried a topical oil which was somewhat helpful. She cannot tolerate NSAIDs. She tried Vicodin which was somewhat helpful, but which she rarely  uses.    Testing thus far as revealed a normal serum protein electrophoresis, negative ANA, normal anti-SSA and anti-SSB antibodies, normal B12, and normal glucose testing.    {Common ambulatory SmartLinks:19316::"Patient's medications, allergies, past medical, surgical, social and family histories were reviewed and updated as appropriate."}    Review of Systems      16 point ROS and was negative except as mentioned in the HPI    Objective:   Physical Exam        Assessment:      [name] is a 55 year old woman with one year of painful distal paresthesias. 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 Her work-up for autoimmune causes of neuropathy has been sufficiently extensive.        Plan:      1) We will perform a skin biopsy today to evaluate for small fiber neuropathy.  2) We recommend that she switch from Lexapro to an antidepressant with a pain modulating effect, such as Effexor or Cymbalta. We recommend her primary physician do a trial of titrating Lexapro from 10mg  down to 5mg , and then initiating Effexor at 37.5mg  and titrating up to 75mg .  3) We will see her in follow-up in 2 months.    Please contact us with any questions or concerns.

## 2010-10-08 NOTE — Patient Instructions (Signed)
 Please discuss with your primary physician the plan to wean her Lexapro and start Effexor for pain control.   Follow up in 2 months.    Please contact [phone] if you have questions or concerns.

## 2010-10-08 NOTE — Telephone Encounter (Signed)
 Neurologist suggested that she start Cymbalta and this was sent into her pharmacy.

## 2010-10-08 NOTE — Telephone Encounter (Signed)
 Called to [facility]

## 2010-10-08 NOTE — Telephone Encounter (Signed)
 [name] would like to discuss starting medication for Neuroposy. Best #: [id number].

## 2010-10-08 NOTE — Patient Instructions (Signed)
 NO FOLLOW UP APPOINTMENT NEEDED.

## 2010-10-08 NOTE — Telephone Encounter (Signed)
 Patient saw neurology and they suggested that patient switch from lexapro to cymbalta or venlafaxine.  She will taper slowly off lexapro and then start cymbalta.

## 2010-10-09 ENCOUNTER — Telehealth: Payer: Self-pay | Admitting: Primary Care

## 2010-10-09 ENCOUNTER — Encounter: Payer: Self-pay | Admitting: Gastroenterology

## 2010-10-09 NOTE — Ambulatory Procedure (Signed)
 Skin biopsy at 2 sites performed.    See scanned procedure note, consent, follow up instructions under media.

## 2010-10-09 NOTE — Procedures (Signed)
 [facility]  [facility]  Exam Date: 10/08/2010    Final Diagnoses:  Pain, limb  Numbness    Referring Physician:  Dr. Derrek Gu    Report Sent To:  Dr. Payton Mccallum  Dr. Derrek Gu      PATIENT HISTORY:  Ms. Carmack is a 55 year old woman seen in neuropathy clinic today by Dr.  Kavin Leech and Dr. Luisa Hart and referred for electrodiagnostic evaluation for  possible neuropathy.    Verbal consent was obtained prior to testing.  During performance of nerve  conduction studies, distal limb temperature was maintained between 32 to 36  degrees Centigrade.      CLINICAL EXAMINATION:  Please refer to Dr. Linward Headland and Dr. Rosezena Sensor clinic note for detailed  clinical examination.      INTERPRETATION   CONCLUSIONS:  This is a normal study.    There is no electrodiagnostic evidence of a large fiber sensorimotor  polyneuropathy to explain the patient's symptoms.      SIGNATURE:  This report signed electronically by  Anne Fu, M.D. on 10/08/2010 11:36:27  --------------------  M. Jeral Pinch, R. NCS T., R. EEG T., CNCT.; Anne Fu, M.D.    This is an abbreviated report summarizing the results and conclusions from  the EMG and Nerve Conduction Study on this patient.  For full  electrophysiologic details, including quantitative data, please see the  final EMG report, or call the [facility] office at [phone].              DD:   10/08/2010  DT:   10/09/2010  7:21 A  AV/[id number]    cc:

## 2010-10-09 NOTE — Progress Notes (Signed)
 Erroneous encounter

## 2010-10-09 NOTE — Telephone Encounter (Signed)
 [name] from [employer] is calling requesting to have a prior authorization for Cymbalta:     Cymbalta 20mg  #60  Prior Auth: [id number]  Reasoning  - product service not covered

## 2010-10-09 NOTE — Telephone Encounter (Signed)
 Can you start prior auth form.

## 2010-10-11 NOTE — Telephone Encounter (Signed)
Form is ready to fax back.

## 2010-10-12 NOTE — Telephone Encounter (Signed)
Form is ready to fax back.

## 2010-10-13 ENCOUNTER — Other Ambulatory Visit: Payer: Self-pay

## 2010-10-27 ENCOUNTER — Encounter: Payer: Self-pay | Admitting: Gastroenterology

## 2010-10-28 LAB — SURGICAL PATHOLOGY

## 2010-10-29 ENCOUNTER — Telehealth: Payer: Self-pay

## 2010-10-29 NOTE — Telephone Encounter (Signed)
 Patient called wanting to know her bx results.  She can be reached at [phone].

## 2010-11-02 ENCOUNTER — Telehealth: Payer: Self-pay

## 2010-11-02 ENCOUNTER — Telehealth: Payer: Self-pay | Admitting: Neurology

## 2010-11-02 NOTE — Telephone Encounter (Signed)
 Pt. Would like you to speak you about the results of her skin bx.  She can be reached at [phone].

## 2010-11-02 NOTE — Telephone Encounter (Signed)
 I called [name] to let her know the results of her skin biopsy. I informed her that the biopsy showed a borderline low small fiber nerve density, which in the right clinical context is consistent with a small fiber neuropathy. She will continue to work with her PCP on symptomatic treatment and return in follow-up.

## 2010-11-20 ENCOUNTER — Telehealth: Payer: Self-pay | Admitting: Primary Care

## 2010-11-20 ENCOUNTER — Other Ambulatory Visit: Payer: Self-pay | Admitting: Primary Care

## 2010-11-20 DIAGNOSIS — Z79899 Other long term (current) drug therapy: Secondary | ICD-10-CM

## 2010-11-20 MED ORDER — AZITHROMYCIN 250 MG PO TABS *I*
ORAL_TABLET | ORAL | Status: DC
Start: 2010-11-20 — End: 2011-01-14

## 2010-11-20 NOTE — Telephone Encounter (Signed)
Pt.notified

## 2010-11-20 NOTE — Telephone Encounter (Signed)
[name] is calling stating she sent you an email and was inquiring if you have gotten a chance to respond and call in a prescription.  Please advise

## 2010-11-20 NOTE — Telephone Encounter (Signed)
Call [place] and let her know that I sent in script for azithromycin to her pharmacy.

## 2010-11-30 ENCOUNTER — Other Ambulatory Visit: Payer: Self-pay | Admitting: Primary Care

## 2010-11-30 DIAGNOSIS — Z760 Encounter for issue of repeat prescription: Secondary | ICD-10-CM

## 2010-11-30 MED ORDER — DULOXETINE HCL 30 MG PO CPEP *I*
30.0000 mg | DELAYED_RELEASE_CAPSULE | Freq: Two times a day (BID) | ORAL | Status: DC
Start: 2010-11-30 — End: 2011-06-18

## 2010-12-29 ENCOUNTER — Encounter: Payer: Self-pay | Admitting: Gastroenterology

## 2011-01-14 ENCOUNTER — Other Ambulatory Visit: Payer: Self-pay | Admitting: Primary Care

## 2011-01-14 MED ORDER — ZOLPIDEM TARTRATE 10 MG PO TABS *I*
ORAL_TABLET | ORAL | Status: DC
Start: 2011-01-14 — End: 2011-06-18

## 2011-01-14 MED ORDER — SUMATRIPTAN SUCCINATE 100 MG PO TABS *I*
ORAL_TABLET | ORAL | Status: DC
Start: 2011-01-14 — End: 2013-02-02

## 2011-01-14 MED ORDER — BUTALBITAL-APAP-CAFFEINE 50-325-40 MG PO TABS *I*
ORAL_TABLET | ORAL | Status: DC
Start: 2011-01-14 — End: 2015-11-13

## 2011-01-14 NOTE — Progress Notes (Signed)
Mailed zolpidem to pharm

## 2011-01-21 ENCOUNTER — Encounter: Payer: Self-pay | Admitting: Primary Care

## 2011-01-21 ENCOUNTER — Telehealth: Payer: Self-pay | Admitting: Primary Care

## 2011-01-21 ENCOUNTER — Encounter: Payer: Self-pay | Admitting: Neurology

## 2011-01-21 ENCOUNTER — Ambulatory Visit: Payer: Self-pay | Admitting: Neurology

## 2011-01-21 VITALS — BP 119/64 | HR 77 | Ht 65.0 in | Wt 142.4 lb

## 2011-01-21 DIAGNOSIS — G629 Polyneuropathy, unspecified: Secondary | ICD-10-CM

## 2011-01-21 DIAGNOSIS — R202 Paresthesia of skin: Secondary | ICD-10-CM

## 2011-01-21 MED ORDER — LAMOTRIGINE 25 MG PO TABS *I*
25.0000 mg | ORAL_TABLET | Freq: Every day | ORAL | Status: DC
Start: 2011-01-21 — End: 2011-06-18

## 2011-01-21 NOTE — Telephone Encounter (Signed)
 Error

## 2011-01-21 NOTE — Progress Notes (Addendum)
Neuromuscular clinic follow up note:     Dear Dr. Oletta Cohn,   We had the pleasure of seeing [name] in the neuromuscular clinic at the [facility] in follow up for her limb pain. Since her last visit Oct 08, 2010 she had an initial improvement of her symptoms about 1-2 months ago and then had worsening of her symptoms. She states that her muscles have been more sore, and are improved with a hot shower and massage. She denies any change in color of her urine. She still continues to have pain in her distal limbs, from above her wrists down to her finger tips, and from her mid shins down to her toes. She describes it as a sharp "cut" like pain. At worst her pain is a 9/10, and on average it is a 6/10 and at best a 2/10. She dose have a family history of maternal family members with FSH dystrophy. She continues to have episodic migraines and does not associate her symptoms to her headaches.     Since her last visit, her skin biopsy showed evidence for a mild, length dependent small fiber sensory neuropathy.  It was noted at the epidermal nerve fiber density was just below normal in the thigh, in the distal leg it was judged to be patchy in distribution, overall mildly reduced, with axonal swellings.  No inflammatory cells and no amyloid.  Her SSA and SSB were negative.  Her ANA was negative. SPEP was normal.  Nerve conduction study was normal.    Last year, she had mildly to minimally elevated CKs at 246 and 321.  This July a CK was reported to be over 700.    Medications:      Current Outpatient Prescriptions    Medication    .  cyclobenzaprine (FLEXERIL) 5 MG tablet    .  mometasone (ELOCON) 0.1 % cream    .  butalbital-acetaminophen-caffeine (FIORICET, ESGIC) 50-325-40 MG per tablet    .  Dexlansoprazole (DEXILANT) 60 MG capsule    .  ZIANA gel    .  valacyclovir (VALTREX) 1 GM tablet    .  guaifenesin (HUMIBID E) 400 MG TABS    .  SUMAtriptan (IMITREX) 100 MG tablet    .  fluticasone  (FLOVENT HFA) 110 MCG/ACT inhaler    .  DULoxetine (CYMBALTA) 20 MG DR capsule    .  HYDROcodone-acetaminophen (VICODIN) 5-500 MG per tablet    .  zolpidem (AMBIEN) 10 MG tablet    .  Cholecalciferol (VITAMIN D) 1000 UNIT tablet    .  sucralfate (CARAFATE) 1 GM tablet      Past Medical History:      Past Medical History    Diagnosis  Date    .  Conversion for Medical History       Conversion Data From [id number] - ^History of Back Pain;Resolved    .  Migraine     .  GERD (gastroesophageal reflux disease)     .  Depression       Family History:      Family History    Problem  Relation  Age of Onset    .  Conversion  Other         [id number]^JYNWGNFAOZHYQMVHQION Progressive Muscular Dystrophy^359.1^Active^      .  Conversion  Other         [id number]^LKGMWNU'U Granulomatosis^446.4^Active^      .  Other  Father  numbness and pain in limbs      .  Other  Sister         numbness and pain in limbs      Social History:      History      Social History    .  Marital Status:  Married      Spouse Name:  N/A      Number of Children:  N/A    .  Years of Education:  N/A      Social History Main Topics    .  Smoking status:  Former Smoker      Types:  Cigarettes      Quit date:  10/07/1980    .  Smokeless tobacco:  Never Used       Comment: rare cigarettes      .  Alcohol Use:  3.5 oz/week      7 drink(s) per week    .  Drug Use:  No    .  Sexually Active:  Yes -- Female partner(s)      Other Topics  Concern    .  None      Social History Narrative    .  None      Review of systems:    A comprehensive review of systems was negative.   Physical Examination:    Blood pressure 119/64, pulse 77, weight 64.5 kg  [name] appears well nourished at this visit and is in no apparent distress. She is alert and fully oriented and has an excellent fund of knowledge. She participates appropriately in all aspects of clinical history and neurological examination.       Extremities: No edema. No dystrophic skin change. No foot deformity.  Slight distal, blanching erythema in feet.   CN: Pupils are equal, round, and reactive to light Extra ocular movements are intact without nystagmus. Fully buries lashes on eye closure. Able to blow out cheeks and pucker. No fasciculations or atrophy of the tongue. Facial sensation is normal and corneal reflexes intact. Palate elevation is full and symmetrical. Strength is full on head turn and neck flexion. Hearing is intact.     Motor (R/L): Muscle bulk appears normal including foot intrinsics. No fasciculations. Muscle tone is normal. Neck extension 5. Shoulder ROM is full. Shoulder abduction 5/5, forward flexion 5/5, external rotation 5/5. Elbow flexion 5/5, extension 5/5. Wrist flexion 5/5, extension 5/5. Finger flexion 5/5, extension 5/5, abduction 5/5. Thumb abduction 5/5. Hip flexion 5/5, abduction 5/5. Knee flexion 5/5, extension 5/5. Ankle dorsiflexion 5/5, plantarflexion 5/5, inversion 5/5, eversion 5/5. Great toe extension 5/5.     She can walk on toes and heels. Can rise from seated position without using arms.     Sensation: There is a stocking gradient of intensity of sharp and cold perception to the mid-shins bilaterally. Romberg is negative. Light touch mildly decreased over the distal toe. Timed vibration with 128 hz is 12  seconds to extinction at the great toe, and 25 seconds at thumbs.     Reflexes (R/L): Biceps 2+/2+, triceps 2+/2+, brachioradialis 2+/2+, knee jerk 3/3, ankle jerk 2+/2+. Mikey Bussing is absent. Jaw jerk is normal. Plantar response is flexion. No clonus.     Coordination: Finger nose to finger is intact. Finger tapping is intact. No ataxia.     Gait and Station: is normal; she can heel walk, toe walk, and perform tandem gait. Romberg is negative.  Assessment and Plan:  Pain in Limbs:   [name] is a 55 year old woman with one year of painful distal paresthesias. 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  Her work-up for autoimmune causes of neuropathy has been sufficiently extensive. She has had recent blood work at the end of July 2012 with an elevated CK level of 632 (was 321 last year), and b12 of >2000, and ALT, AST of 36 each. At this time her pain is still present and has not improved with use of gabapentin despite the patient trying a lower dose. She also switched to cymbalta and is taking 60mg  daily for the past 2 weeks. At this time, since she is tolerating Cymbalta well we would recommend her to have her PCP attempt a trial adjustment of her Cymbalta to a max dose of 90mg  daily.  We will also start the patient on lamotrigine at 25mg  daily with titration instructions to increase by 25mg  each week to a goal of 50mg  BID, then increase by 50mg  every other week to a max dose of 100mg  BID by week 8. We will see her back in about 6 months time. We will recheck her CK level today. If it is high we will then reschedule her for an EMG and then if that is abnormal a muscle biopsy.  We will schedule earlier followup once we've completed further testing, if the results are abnormal    I saw and evaluated the patient. I have reviewed and edited the resident's/fellow's note and confirm the findings and plan of care as documented above.    Cammie Sickle, MD  .

## 2011-01-21 NOTE — Patient Instructions (Signed)
You have been today in the neuromuscular clinic.   Please have blood drawn today before leaving the hospital.   We will start you on lamotrigine with a titration schedule provided to you.   We will see you back in about 3 months time.

## 2011-01-21 NOTE — Telephone Encounter (Signed)
Patient is requesting dr.andolina give her a call back. She states she needs to follow up with dr.andolina about her neurology appointment, the neuro doctor dr.stanton is her neuro doctor. ordered blood work, and she wanted to talk to dr.andolina about adding another test to that. She is requesting he call her this afternoon. She would like to speak with dr.andolina before she gets the blood work done this afternoon. She wanted to ask dr.A about a test for lyme diease.

## 2011-01-29 ENCOUNTER — Telehealth: Payer: Self-pay | Admitting: Primary Care

## 2011-01-29 NOTE — Telephone Encounter (Signed)
Lyme disease test negative.   Is she concerned that she had a recent exposure to lyme disease.  If she is she should have repeat blood work in 1 month.    Ck 669  Ck-MB 18.6    These are slightly increased since her blood work done in July (CK was 632, CK-MB was 18.6).  This increase is not that clinically significant.

## 2011-01-29 NOTE — Telephone Encounter (Signed)
Gave patient results, she is not concerned about exposure to lyme disease.

## 2011-01-29 NOTE — Telephone Encounter (Signed)
[name] is calling requesting the results of her CK and Lyme lab work - i advised her that Fawn Kirk was out of the office til next week she then asked for another provider to give her the lab results. She stated that she had them done at [facility].   Please advise

## 2011-02-03 ENCOUNTER — Other Ambulatory Visit: Payer: Self-pay

## 2011-02-03 DIAGNOSIS — R748 Abnormal levels of other serum enzymes: Secondary | ICD-10-CM

## 2011-02-03 NOTE — Progress Notes (Signed)
Her recent outside lab draw showed CK was still elevated at 663 which is greater than last years baseline.    An EMG is ordered,  Muscle biopsy will be ordered afterward but using EMG to try to target a muscle, otherwise random quadriceps biopsy.

## 2011-02-11 ENCOUNTER — Encounter: Payer: Self-pay | Admitting: Neurology

## 2011-02-12 ENCOUNTER — Ambulatory Visit: Payer: Self-pay | Admitting: Neurology

## 2011-02-15 NOTE — Procedures (Signed)
[facility]  [facility]  Exam Date: 02/12/2011    Final Diagnoses:  Myalgia  Weakness    Referring Physician:  Dr. Derrek Gu    Report Sent To:  Dr. Derrek Gu  Dr. Payton Mccallum      PATIENT HISTORY:  [name] is a 55 year old woman previously studied here in May 2012.  Recently she was seen by Dr. Kavin Leech and referred for electrodiagnostic  studies to evaluation for possible myopathy and possible neuropathy.  Pt.  reportedly still has elevated CK.    Verbal consent was obtained prior to testing.  During performance of nerve  conduction studies, distal limb temperature was maintained between 32 to 36  degrees Centigrade.      CLINICAL EXAMINATION:  Please refer to Dr. Iran Planas neuromuscular clinic note from  01/21/11 for a detailed neurologic examination.      INTERPRETATION   CONCLUSIONS:  This is a slightly abnormal study.    Tibial, peroneal, and ulnar motor nerve conduction studies are normal.  Radial, sural, and medial plantar sensory nerve conduction studies are  normal.  Needle EMG examination of proximal and distal limb muscles is  normal.  However, there is increased insertional activity and sustained  fibrillation potentials and positive waves in the mid thoracic paraspinal  muscles with normal motor unit morphology.    Overall, there is no clear electrodiagnostic evidence of a generalized  myopathy.  The isolated thoracic paraspinal EMG abnormalities are  nonspecific and of uncertain clinical significance, but in this clinical  setting, are consistent with a very mild underlying myopathy.    There is no evidence of a peripheral neuropathy, and there is no interval  change in the nerve conduction studies compared to the prior examination 4  months ago.      SIGNATURE:  This report signed electronically by  Doylene Canard, M.D. on 02/12/2011 17:35:55  --------------------  M. Jeral Pinch, R. NCS T., R. EEG T., CNCT.; Doylene Canard, M.D.    This is an abbreviated  report summarizing the results and conclusions from  the EMG and Nerve Conduction Study on this patient.  For full  electrophysiologic details, including quantitative data, please see the  final EMG report, or call the [facility] office at [phone].              DD:   02/12/2011  DT:   02/15/2011  9:09 A  [id number]    cc:

## 2011-03-02 ENCOUNTER — Telehealth: Payer: Self-pay | Admitting: Primary Care

## 2011-03-02 DIAGNOSIS — G629 Polyneuropathy, unspecified: Secondary | ICD-10-CM

## 2011-03-02 MED ORDER — DULOXETINE HCL 20 MG PO CPEP *I*
DELAYED_RELEASE_CAPSULE | ORAL | Status: DC
Start: 2011-03-02 — End: 2011-06-18

## 2011-03-02 NOTE — Telephone Encounter (Signed)
[name] would like to discuss possibly changing her cymbalta. Please call [name] prior to 2 PM today, if possible.     Thank you

## 2011-03-02 NOTE — Telephone Encounter (Signed)
Patient is having increased headaches with lamotrigine 50 mg twice a day.  She decreased to 25 mg twice a day and if her headaches continue, she will taper and stop.  Neurology recommended that she increase Cymbalta to 80 mg a day.  She will call in 2 weeks for an update.

## 2011-03-03 ENCOUNTER — Telehealth: Payer: Self-pay | Admitting: Primary Care

## 2011-03-03 NOTE — Telephone Encounter (Signed)
Please try to find prior auth form.

## 2011-03-03 NOTE — Telephone Encounter (Signed)
IN YOUR BIN

## 2011-03-03 NOTE — Telephone Encounter (Signed)
[employer] called regarding cymbalta script take 4x daily-  requires a prior auth-   Or you can write 2 scripts - 20mg  and a 60mg  - but there will be 2 co-pay  please advise  [facility] [phone]

## 2011-03-05 ENCOUNTER — Encounter: Payer: Self-pay | Admitting: Primary Care

## 2011-03-05 ENCOUNTER — Telehealth: Payer: Self-pay | Admitting: Primary Care

## 2011-03-05 MED ORDER — HYDROCODONE-ACETAMINOPHEN 5-500 MG PO TABS
1.0000 | ORAL_TABLET | Freq: Four times a day (QID) | ORAL | Status: DC | PRN
Start: 2011-03-05 — End: 2011-06-18

## 2011-03-05 NOTE — Telephone Encounter (Signed)
Please fax form and mark urgent.

## 2011-03-05 NOTE — Telephone Encounter (Signed)
Form faxed

## 2011-03-05 NOTE — Telephone Encounter (Signed)
Patient sent you a private e-mail this morning regarding Vicodin picking up a prescription today she is having pain that is worsening at night. Since this was not sent through E-Record I was unabel to help her follow up on this. I advised her that when sending personal e-mail we are unable to see it to assist her but if it is sent thorough MYCHART it is easier for [place] to help her. With her OK I'm sending her out a new MYCHART activation letter

## 2011-04-02 ENCOUNTER — Encounter: Payer: Self-pay | Admitting: Gastroenterology

## 2011-04-09 ENCOUNTER — Encounter: Payer: Self-pay | Admitting: Primary Care

## 2011-04-09 ENCOUNTER — Telehealth: Payer: Self-pay | Admitting: Primary Care

## 2011-04-09 ENCOUNTER — Ambulatory Visit
Admit: 2011-04-09 | Discharge: 2011-04-09 | Disposition: A | Discharge status: Home / Self Care | Payer: Self-pay | Source: Ambulatory Visit | Attending: Primary Care | Admitting: Primary Care

## 2011-04-09 DIAGNOSIS — N390 Urinary tract infection, site not specified: Secondary | ICD-10-CM

## 2011-04-09 LAB — URINALYSIS WITH MICROSCOPIC
Blood,UA: NEGATIVE
Ketones, UA: NEGATIVE
Leuk Esterase,UA: NEGATIVE
Nitrite,UA: NEGATIVE
Protein,UA: NEGATIVE mg/dL
RBC,UA: <1 /hpf (ref 0–2)
Specific Gravity,UA: 1.003 (ref 1.002–1.030)
WBC,UA: NONE SEEN /hpf (ref 0–5)
pH,UA: 7 (ref 5.0–8.0)

## 2011-04-09 MED ORDER — CIPROFLOXACIN HCL 500 MG PO TABS *I*
500.0000 mg | ORAL_TABLET | Freq: Two times a day (BID) | ORAL | Status: DC
Start: 2011-04-09 — End: 2011-06-18

## 2011-04-09 NOTE — Telephone Encounter (Signed)
 error

## 2011-04-09 NOTE — Telephone Encounter (Signed)
Call [place].  I sent in order through [facility] lab.  See if she wants an antibiotic before we get the culture results back.

## 2011-04-09 NOTE — Telephone Encounter (Signed)
Spoke to [place], gave her the information per Dr.Andolina.

## 2011-04-09 NOTE — Telephone Encounter (Signed)
I sent in script for cipro to her pharmacy.

## 2011-04-09 NOTE — Telephone Encounter (Signed)
Patient is having symptoms of a UTI and she would like to know if you can enter orders for her to have a UA done at the lab.

## 2011-04-09 NOTE — Telephone Encounter (Signed)
Error

## 2011-04-09 NOTE — Telephone Encounter (Signed)
Pt called, advised of the message below, and pt would like an antibiotic before we get the culture results back, would like it to be sent today.

## 2011-04-09 NOTE — Telephone Encounter (Signed)
Addended by: Robbi Garter on: 04/09/2011 01:33 PM     Modules accepted: Orders

## 2011-04-10 LAB — AEROBIC CULTURE: Aerobic Culture: 0

## 2011-04-12 ENCOUNTER — Telehealth: Payer: Self-pay | Admitting: Primary Care

## 2011-04-12 NOTE — Telephone Encounter (Signed)
Message copied by Hali Marry on Mon Apr 12, 2011  9:16 AM  ------       Message from: Robbi Garter       Created: Sun Apr 11, 2011  4:20 PM         Call patient.  She does not have a UTI.  She can stop the antibiotic.

## 2011-04-12 NOTE — Telephone Encounter (Signed)
Spoke to [name], gave her the information per Dr.Andolina. She stated that she is still having the pressure so she is going to contact her GYN to make an appointment.

## 2011-04-14 ENCOUNTER — Encounter: Payer: Self-pay | Admitting: Gastroenterology

## 2011-04-15 ENCOUNTER — Ambulatory Visit: Payer: Self-pay | Admitting: Neurology

## 2011-04-20 ENCOUNTER — Other Ambulatory Visit: Payer: Self-pay | Admitting: Primary Care

## 2011-04-20 MED ORDER — VALACYCLOVIR HCL 1000 MG PO TABS *I*
ORAL_TABLET | ORAL | Status: DC
Start: 2011-04-20 — End: 2011-08-23

## 2011-04-29 ENCOUNTER — Encounter: Payer: Self-pay | Admitting: Primary Care

## 2011-04-30 ENCOUNTER — Ambulatory Visit: Payer: Self-pay | Admitting: Primary Care

## 2011-06-04 ENCOUNTER — Telehealth: Payer: Self-pay | Admitting: Primary Care

## 2011-06-18 ENCOUNTER — Encounter: Payer: Self-pay | Admitting: Primary Care

## 2011-06-18 ENCOUNTER — Encounter: Payer: Self-pay | Admitting: Gastroenterology

## 2011-06-18 ENCOUNTER — Ambulatory Visit: Payer: Self-pay | Admitting: Primary Care

## 2011-06-18 VITALS — BP 124/72 | HR 76 | Ht 65.0 in | Wt 142.0 lb

## 2011-06-18 DIAGNOSIS — R1013 Epigastric pain: Secondary | ICD-10-CM

## 2011-06-18 DIAGNOSIS — R079 Chest pain, unspecified: Secondary | ICD-10-CM

## 2011-06-18 MED ORDER — HYDROCODONE-ACETAMINOPHEN 5-500 MG PO TABS
ORAL_TABLET | ORAL | Status: DC
Start: 2011-06-18 — End: 2012-03-10

## 2011-06-18 MED ORDER — ZOLPIDEM TARTRATE 10 MG PO TABS *I*
ORAL_TABLET | ORAL | Status: DC
Start: 2011-06-18 — End: 2013-03-20

## 2011-06-19 ENCOUNTER — Telehealth: Payer: Self-pay | Admitting: Primary Care

## 2011-06-19 DIAGNOSIS — R748 Abnormal levels of other serum enzymes: Secondary | ICD-10-CM | POA: Insufficient documentation

## 2011-06-19 DIAGNOSIS — G608 Other hereditary and idiopathic neuropathies: Secondary | ICD-10-CM | POA: Insufficient documentation

## 2011-06-19 DIAGNOSIS — Z820 Family history of epilepsy and other diseases of the nervous system: Secondary | ICD-10-CM | POA: Insufficient documentation

## 2011-07-30 ENCOUNTER — Encounter: Payer: Self-pay | Admitting: Primary Care

## 2011-07-30 ENCOUNTER — Ambulatory Visit: Payer: Self-pay | Admitting: Primary Care

## 2011-07-30 VITALS — BP 102/80 | HR 72 | Temp 98.1°F | Ht 65.0 in | Wt 139.0 lb

## 2011-07-30 DIAGNOSIS — J019 Acute sinusitis, unspecified: Secondary | ICD-10-CM

## 2011-07-30 MED ORDER — AZITHROMYCIN 250 MG PO TABS *I*
ORAL_TABLET | ORAL | Status: AC
Start: 2011-07-30 — End: 2011-08-09

## 2011-07-30 NOTE — Patient Instructions (Signed)
Sinusitis Plan  Home and rest.  Push fluids (will help to moisten and loosen secretions).  Zithromax as prescribed.  Guaifenesin (Mucinex) OTC can be used to loosen plegm and secretions.  Could try sinus irrigation with OTC kit.  Saline nasal spray.  Detailed patient handout provided summarizing  OTC medications for cold/flu/sinus symptoms.  Would expect improvement to resolution over 5-10 days.  Call if worsening cough, fever, chills, shortness of breath, or purulent secretions.

## 2011-08-01 NOTE — Progress Notes (Signed)
Subjective:      Patient ID: [name] is a 56 y.o. year old female.    Chief Complaint   Patient presents with    Cough    Facial Pain    Otalgia     HPI:    Progressive sinus pressure and congestion especially at the right maxillary sinuses since this past Monday.  In addition, there's been a progressive ache at the right ear.  There's been clicking and clogging.  She's been using Mucinex and saline irrigation which helps in the short-term.  Some the mucus has been yellow tinged.  There is a minimal cough.  She does not smoke cigarettes.    Allergy / Social History / Medications:  Allergies   Allergen Reactions    Demerol     Erythromycin     Sulfa Drugs     No Known Latex Allergy      History   Substance Use Topics    Smoking status: Former Smoker     Types: Cigarettes     Quit date: 10/07/1980    Smokeless tobacco: Never Used    Comment: rare cigarettes    Alcohol Use: 3.5 oz/week     7 drink(s) per week     Current Outpatient Prescriptions   Medication Sig    Estriol Micronized POWD     azithromycin (ZITHROMAX) 250 MG tablet Take 2 tablets (500 mg) on day 1, followed by 1 tablet (250 mg) on days 2 through 5.    DULoxetine (CYMBALTA) 20 MG DR capsule Take 20 mg by mouth daily   2 capsules twice a day     HYDROcodone-acetaminophen (VICODIN) 5-500 MG per tablet TAKE 1 TABLET EVERY 4-6 HOURS PRN    zolpidem (AMBIEN) 10 MG tablet Take 1/2 to 1 tablet at bedtime prn sleep    valACYclovir (VALTREX) 1 GM tablet TAKE 1 TABLET DAILY    SUMAtriptan (IMITREX) 100 MG tablet TAKE 1 TABLET FOR MIGRAINE RELIEF. MAY REPEAT 2 HOURS LATER. MAXIMUM 200MG /DAY.    butalbital-acetaminophen-caffeine (FIORICET, ESGIC) 50-325-40 MG per tablet 1 OR 2 TABLETS EVERY 4 TO 6 HOURS AS NEEDED FOR HEADACHE.    B Complex Vitamins (VITAMIN B COMPLEX) capsule Take 2 capsules by mouth every 12 hours   Neuropathy multivitamin     cyclobenzaprine (FLEXERIL) 5 MG tablet TAKE 1 TABLET 3 TIMES DAILY AS NEEDED.    ACZONE 5 %  topical gel Apply  topically.    Dexlansoprazole (DEXILANT) 60 MG capsule TAKE 1 CAPSULE DAILY EVERY MORNING BEFORE BREAKFAST.    ZIANA gel Apply  topically.    Multiple Vitamin (MULTIVITAMIN) capsule TAKE 1 CAPSULE DAILY.    Cholecalciferol (VITAMIN D) 1000 UNIT tablet TAKE 1 TABLET DAILY.    Magnesium 200 MG TABS TAKE 1 TABLET DAILY AS DIRECTED.    sucralfate (CARAFATE) 1 GM tablet TAKE 1 TABLET 4 TIMES DAILY PRN    fluticasone (FLOVENT HFA) 110 MCG/ACT inhaler INHALE 2 PUFFS TWICE DAILY     Objective:     Recent Lab Results:  Lab Results   Component Value Date    NA 133 11/20/2009    K 3.7 11/20/2009    CL 97 11/20/2009    CO2 29* 11/20/2009    UN 8 11/20/2009    CREAT 0.72 11/20/2009    VID25 62 04/08/2010    WBC 8.1 12/12/2009    HGB 13.7 12/12/2009    HCT 42 12/12/2009    PLT 326 12/12/2009    TSH 1.70  04/08/2010     Physical Exam:  Filed Vitals:    07/30/11 1150   BP: 102/80   Pulse: 72   Temp: 36.7 C (98.1 F)   Height: 1.651 m (5\' 5" )   Weight: 63.05 kg (139 lb)     SpO2 Readings from Last 3 Encounters:   07/30/11 97%   03/12/09 99%   08/19/08 97%     Estimated Body mass index is 23.13 kg/(m^2) as calculated from the following:    Height as of this encounter: 5\' 5" (1.651 m).    Weight as of this encounter: 139 lb(63.05 kg).    GENERAL: Oriented x3. NAD. Conversant. Pleasant.    HEENT: PERRLADC. EOMI. Ear canals clear. TM's without erythema or fluid levels. The right TM does appear mildly pink. Nares patent. Oropharynx clear without swelling/erythema/exudate. No cervical adenopathy.         R.  Frontal Sinus: NonTender / Transillumination intact.       L.  Frontal Sinus: NonTender / Transillumination intact.       R.  Maxillary Sinus: Tender / Transillumination intact.       L.   Maxillary Sinus: Tender / Transillumination intact.    CHEST: Good aeration. CTA. No dullness to percussion.    CARDIAC: RRR.    Assessment:     1. Acute sinus infection      Plan:     Orders Placed This Encounter    azithromycin  (ZITHROMAX) 250 MG tablet     Patient Instructions   Sinusitis Plan  Home and rest.  Push fluids (will help to moisten and loosen secretions).  Zithromax as prescribed.  Guaifenesin (Mucinex) OTC can be used to loosen plegm and secretions.  Could try sinus irrigation with OTC kit.  Saline nasal spray.  Detailed patient handout provided summarizing  OTC medications for cold/flu/sinus symptoms.  Would expect improvement to resolution over 5-10 days.  Call if worsening cough, fever, chills, shortness of breath, or purulent secretions.        Medications were reviewed and changes were made as detailed below:  Patient's Medications   New Prescriptions    AZITHROMYCIN (ZITHROMAX) 250 MG TABLET    Take 2 tablets (500 mg) on day 1, followed by 1 tablet (250 mg) on days 2 through 5.   Previous Medications    ACZONE 5 % TOPICAL GEL    Apply  topically.    B COMPLEX VITAMINS (VITAMIN B COMPLEX) CAPSULE    Take 2 capsules by mouth every 12 hours   Neuropathy multivitamin     BUTALBITAL-ACETAMINOPHEN-CAFFEINE (FIORICET, ESGIC) 50-325-40 MG PER TABLET    1 OR 2 TABLETS EVERY 4 TO 6 HOURS AS NEEDED FOR HEADACHE.    CHOLECALCIFEROL (VITAMIN D) 1000 UNIT TABLET    TAKE 1 TABLET DAILY.    CYCLOBENZAPRINE (FLEXERIL) 5 MG TABLET    TAKE 1 TABLET 3 TIMES DAILY AS NEEDED.    DEXLANSOPRAZOLE (DEXILANT) 60 MG CAPSULE    TAKE 1 CAPSULE DAILY EVERY MORNING BEFORE BREAKFAST.    DULOXETINE (CYMBALTA) 20 MG DR CAPSULE    Take 20 mg by mouth daily   2 capsules twice a day     ESTRIOL MICRONIZED POWD        FLUTICASONE (FLOVENT HFA) 110 MCG/ACT INHALER    INHALE 2 PUFFS TWICE DAILY    HYDROCODONE-ACETAMINOPHEN (VICODIN) 5-500 MG PER TABLET    TAKE 1 TABLET EVERY 4-6 HOURS PRN    MAGNESIUM 200 MG TABS  TAKE 1 TABLET DAILY AS DIRECTED.    MULTIPLE VITAMIN (MULTIVITAMIN) CAPSULE    TAKE 1 CAPSULE DAILY.    SUCRALFATE (CARAFATE) 1 GM TABLET    TAKE 1 TABLET 4 TIMES DAILY PRN    SUMATRIPTAN (IMITREX) 100 MG TABLET    TAKE 1 TABLET FOR MIGRAINE  RELIEF. MAY REPEAT 2 HOURS LATER. MAXIMUM 200MG /DAY.    VALACYCLOVIR (VALTREX) 1 GM TABLET    TAKE 1 TABLET DAILY    ZIANA GEL    Apply  topically.    ZOLPIDEM (AMBIEN) 10 MG TABLET    Take 1/2 to 1 tablet at bedtime prn sleep   Modified Medications    No medications on file   Discontinued Medications    No medications on file

## 2011-08-20 ENCOUNTER — Encounter: Payer: Self-pay | Admitting: Developmental and Behavioral Pediatrics

## 2011-08-23 ENCOUNTER — Ambulatory Visit: Payer: Self-pay | Admitting: Colorectal Surgery

## 2011-08-23 ENCOUNTER — Ambulatory Visit
Admit: 2011-08-23 | Discharge: 2011-08-23 | Disposition: A | Discharge status: Home / Self Care | Payer: Self-pay | Source: Ambulatory Visit | Attending: Colorectal Surgery | Admitting: Colorectal Surgery

## 2011-08-23 ENCOUNTER — Encounter: Payer: Self-pay | Admitting: Colorectal Surgery

## 2011-08-23 VITALS — BP 120/74 | HR 63 | Temp 98.8°F | Resp 16 | Ht 65.0 in | Wt 131.0 lb

## 2011-08-23 DIAGNOSIS — K5900 Constipation, unspecified: Secondary | ICD-10-CM

## 2011-08-23 NOTE — Patient Instructions (Addendum)
Please drink 48 to 64 ounces of water daily.   Konsyl 1 tsp oral daily.  Gas-X (Simethicone) 2 tabs with meals and bedtime as needed for gas, cramping, bloating.  Monitor your progress with the Bowel Journal.   In two weeks, please call Morrie Sheldon [phone]) to report your progress.    Perianal care:  You can use warm water and cotton balls to clean the area. Thoroughly dry the area using a blow dryer. Place cotton ball against anal opening. You may apply Calmoseptine (Zinc Oxide) as needed to perianal area.     Defecography-We will set up.   Follow up 8 weeks.

## 2011-08-25 NOTE — Procedures (Signed)
[name], [name]  MR #:  [id number]   ACCOUNT #:  [id number] DOB:  [birth year]   DICTATED BY:  Eugenie Norrie, MD PROCEDURE DATE:  08/23/2011     PREPROCEDURE DIAGNOSIS:  Fecal incontinence.    POSTPROCEDURE DIAGNOSIS:  Fecal incontinence.    PROCEDURE:  Endoanal ultrasound with 3D interpretation without post processing.    COMPLICATIONS:  None.    DISPOSITION:  Stable to home.    FINDINGS:  Multiple images of the upper, mid and distal anal canal were obtained as well as 3D scanning and results are as follows.  Upper anal canal shows a normal appearing puborectalis muscle. Mid anal canal shows an internal sphincter muscle that is intact although significantly thinned in the anterior quadrant.  Distally the external anal sphincter is visible.  There is a sphincter defect measuring approximately 80.5 degrees,in the anterior quadrant.  The perineal body distance measures 4.5 mm.  The patient tolerated the procedure well.  This is an abnormal endoanal ultrasound with the sphincter damage from birthing trauma.             ______________________________  Eugenie Norrie, MD    JS/MODL  DD:  08/24/2011 18:10:25  DT:  08/25/2011 08:09:05  Job #:  [id number]

## 2011-09-07 NOTE — H&P (Signed)
This office note has been dictated.

## 2011-09-08 ENCOUNTER — Encounter: Payer: Self-pay | Admitting: Colorectal Surgery

## 2011-09-08 ENCOUNTER — Ambulatory Visit: Admit: 2011-09-08 | Payer: Self-pay | Source: Ambulatory Visit | Admitting: Colorectal Surgery

## 2011-09-08 NOTE — H&P (Signed)
PATIENTLASHONA, [name]  MR #:  [id number]   ACCOUNT #:   DOB:  March 04, 1956   ATTENDING:  Eugenie Norrie, MD AGE:  56   DATE OF VISIT:       ADDRESS:  Eulis Foster, MD   [address]   [address]   [place], [place] [address]    BODY:  Dear Dr. Pershing Proud,     Thank you for this request for consultation on [name].  As you know, she is a pleasant 56 year old woman who has been having problems with bowel control issues and motility.  She states that she has urgency and fecal incontinence.  She states it has worsened since her rectocele sphincter surgery back in 2010.  She states that she has difficulty evacuating.  She has fecal incontinence.  She has gas incontinence.  She has bowel movements usually once a day.  Sometimes she can have multiple bowel movements a day.  She never feels as though she is completely empty.  She has a significant amount of urgency, sometimes a small amount of stool will leak out and sometimes a larger amount.  She states she has soft formed stools 1-2 times per week.  She has smearing 1 time per day at least or more.  She has frequent gas incontinence.  She does not have any blood with defecation.  She does strain significantly to go.  She has she states that she has to push on the perineum to evacuate.  She has had 1 vaginal delivery with a tear and episiotomy.  She has a very low-fiber diet.  She does drink a significant amount of water.    ALLERGIES:  Sulfa, erythromycin and Demerol.    MEDICATIONS ARE:    1. Cymbalta.  2. Valtrex.   3. DHEA.  4. Magnesium tablets.  5. Probiotics.  6. Digestive enzymes.  7. Carafate.  8. Ambien.  9. Mytrex.   10. Vicodin.  11. Dexilant.  12. Cymbalta.    PAST MEDICAL HISTORY:  Significant for musculoskeletal pain, neuropathy, leaky gut syndrome, GERD, migraines, depression, gastroparesis, IBS,  gout.    SURGICAL HISTORY:  Significant for rectocele repair in 2010 with a sling anoplasty.  In 2009 she had endometrial ablation, 2003 she had a removal of nasal  polyps, 1988 she had a vaginal delivery, in 1985 she had a C-section.    FAMILY HISTORY:  Significant for coronary artery disease, hypertension.    SOCIAL HISTORY:  Significant for being a Designer, jewellery at [employer].  She is married.  She has 2 children.  She lives with her husband.  She has 1 glass of wine 5 days per week.  She does not use tobacco or recreational drugs.  She had a colonoscopy back in 2010, which was normal.  She states she is over 50 incontinent episodes of gas and per week, as 3 fecal incontinent episodes per week.  She does strain with defecation.  She has had 1 vaginal delivery with episiotomy and tear.    REVIEW OF SYSTEMS:  Significant for palpitations, heartburn, bowel incontinence, gastroparesis, IBS, leaky gut syndrome, GERD, generalized  muscle pain, muscle cramps and lower back pain, as well as small fiber neuropathy.  Rest of her 10-system review of systems is negative.    PHYSICAL EXAM:  She is generally well in no apparent distress.  EYES: Pupils are equal.  Sclerae nonicteric.  ENT: Mucosal membranes moist.  NECK: No cervical or supraclavicular lymphadenopathy.  CHEST:  Clear to auscultation bilaterally.  HEART: Regular rate and rhythm.  ABDOMEN:  Soft, nontender, nondistended.  She has a Pfannenstiel incision that is healed. EXTREMITIES:  Warm bilaterally with no clubbing, cyanosis, or edema.  INTEGUMENT: No visible lesions.  NEURO: Speech clear.  Gait steady.  Moves all extremities.  No focal deficits.  PSYCH: Appropriate affect and mood.       Upon inspection of perianal area, she has a significant scar along the perineal body to the anal verge.  She has a thin perineal body.  Digital rectal exam was performed, which showed decreased rest pressures, a small anterior rectocele with absent squeeze pressures and significant perineal descent.  Anoscopy was performed.  The anoscope was inserted the anal canal.  The anal mucosa was pink and moist.  Upon withdrawal of the  scope, she has a right anterior grade 2 internal hemorrhoid.  She tolerated the procedure well.  Her Wechsler fecal incontinence score is 16.    ASSESSMENT:  [name] is a pleasant woman who has significant GI dysfunction.  She has difficulty with evacuation as well as fecal incontinence.  At this point, we will perform an endo anal ultrasound and anorectal manometry to evaluate her anatomy as well as her sphincter function.  The patient most likely has some significant pelvic floor dysfunction and may benefit from pelvic floor muscle rehabilitation/biofeedback.  At the current time, we will start the patient on a bulking insoluble fiber diet, Konsyl 1 teaspoons daily, Gas-X 2 tablets with meals as needed for gas.  She will start a bowel journal to keep track of her results.  She will undergo a defecography to evaluate her pelvic floor as well.  She will contact us within 2 weeks to let us know how she is progressing and will follow up with [place] in our office in 8 weeks.  Thank you, Dr. Pershing Proud for this request of consultation on [name].  If you have any questions, please feel free to contact my office.  With best personal regards.       Sincerely,             ______________________________  Eugenie Norrie, MD    JS/MODL  DD:  09/07/2011 15:35:29  DT:  09/07/2011 16:58:48  Job #:  [id number]    cc: Robbi Garter, MD   [address] [place], [place] [address]     Eulis Foster, MD   [address]   [address]   [place], [place] [address]     Susa Loffler, [place]   [address]   [place], [place] [address]

## 2011-09-10 ENCOUNTER — Telehealth: Payer: Self-pay | Admitting: Primary Care

## 2011-09-10 NOTE — Telephone Encounter (Signed)
Patient complains of worsening of her chronic burning pain.  She has a sensory neuropathy and is on Cymbalta.  She may also have fibromyalgia.  She has a persistently high CK.  Her labs will be repeated.  She will re-start gabapentin 300 mg at bedtime.

## 2011-09-11 ENCOUNTER — Encounter: Payer: Self-pay | Admitting: Gastroenterology

## 2011-09-27 ENCOUNTER — Other Ambulatory Visit: Payer: Self-pay | Admitting: Developmental and Behavioral Pediatrics

## 2011-10-18 ENCOUNTER — Ambulatory Visit
Admit: 2011-10-18 | Disposition: A | Discharge status: Home / Self Care | Payer: Self-pay | Source: Ambulatory Visit | Attending: Colorectal Surgery | Admitting: Colorectal Surgery

## 2011-10-18 ENCOUNTER — Ambulatory Visit: Payer: Self-pay | Admitting: Colorectal Surgery

## 2011-10-21 ENCOUNTER — Other Ambulatory Visit: Payer: Self-pay | Admitting: Primary Care

## 2011-10-21 DIAGNOSIS — M2550 Pain in unspecified joint: Secondary | ICD-10-CM

## 2011-11-15 ENCOUNTER — Ambulatory Visit
Admit: 2011-11-15 | Discharge: 2011-11-15 | Disposition: A | Discharge status: Home / Self Care | Payer: Self-pay | Source: Ambulatory Visit | Attending: Developmental and Behavioral Pediatrics | Admitting: Developmental and Behavioral Pediatrics

## 2011-11-17 ENCOUNTER — Telehealth: Payer: Self-pay | Admitting: Colorectal Surgery

## 2011-11-17 NOTE — Telephone Encounter (Signed)
Patient is returning Jen's call to rsc nos appt. Asking to be called back @ number provided.

## 2011-11-29 ENCOUNTER — Encounter: Payer: Self-pay | Admitting: Primary Care

## 2011-11-30 ENCOUNTER — Ambulatory Visit: Payer: Self-pay | Admitting: Primary Care

## 2011-11-30 ENCOUNTER — Encounter: Payer: Self-pay | Admitting: Primary Care

## 2011-11-30 ENCOUNTER — Other Ambulatory Visit: Payer: Self-pay | Admitting: Primary Care

## 2011-11-30 VITALS — BP 100/70 | HR 72 | Temp 97.4°F | Ht 65.0 in | Wt 136.0 lb

## 2011-11-30 DIAGNOSIS — J019 Acute sinusitis, unspecified: Secondary | ICD-10-CM

## 2011-11-30 MED ORDER — MOMETASONE FUROATE 50 MCG/ACT NA SUSP *I*
2.0000 | Freq: Every day | NASAL | Status: DC
Start: 2011-11-30 — End: 2019-03-21

## 2011-11-30 MED ORDER — FLUTICASONE PROPIONATE HFA 110 MCG/ACT IN AERO *I*
INHALATION_SPRAY | RESPIRATORY_TRACT | Status: DC
Start: 2011-11-30 — End: 2012-12-19

## 2011-11-30 MED ORDER — AZITHROMYCIN 250 MG PO TABS *I*
ORAL_TABLET | ORAL | Status: AC
Start: 2011-11-30 — End: 2011-12-10

## 2011-11-30 MED ORDER — GABAPENTIN 100 MG PO CAPS
ORAL_CAPSULE | ORAL | Status: DC
Start: 2011-11-30 — End: 2012-03-11

## 2011-11-30 NOTE — Progress Notes (Signed)
Subjective:      Patient ID: [name] is a 56 y.o. year old female.    Chief Complaint   Patient presents with   . Headache     nasal congestion,ears feel like under water     HPI:    Started about  5 days ago c/o sinus congestion.  Has been using saline irrigation, mucinex, decongestant.  Having worsening sinus congestion with sinus pressure and facial pain.  No purulent mucous.  Ears c/o pressure with muffled hearing.  There has been a prior history of sinus issues.  Last November fell onto face.  Thinks a bit more prone to sinus congestion ever since.  Prior history of deviated septal repain and polyp removal.  Does have seasonal allergies and uses zyrtec - has been using Astepro.  Has been on Flonase in past; did not like the lilac smell.    Allergy / Social History / Medications:  Allergies   Allergen Reactions   . Demerol Nausea And Vomiting   . Environmental Allergies    . Erythromycin      ABDOMINAL PAIN   . Gluten Meal      BLOATING, SNEEZING, WATERY EYES, NEUROPATHY FLARE   . Sulfa Drugs Hives     History   Substance Use Topics   . Smoking status: Former Smoker     Types: Cigarettes     Quit date: 10/07/1980   . Smokeless tobacco: Never Used    Comment: rare cigarettes   . Alcohol Use: 3.5 oz/week     7 drink(s) per week     Current Outpatient Prescriptions   Medication Sig   . fluticasone (FLOVENT HFA) 110 MCG/ACT inhaler INHALE 2 PUFFS TWICE DAILY   . gabapentin (NEURONTIN) 100 MG capsule Take 2 capsules at bedtime.   Marland Kitchen 7-KETO DHEA Take 100 mg by mouth daily   . Probiotic Product (PROBIOTIC FORMULA PO) Take 1 capsule by mouth daily   . DIGESTIVE ENZYMES PO Take 1 capsule by mouth daily   . valACYclovir (VALTREX) 1 GM tablet Take 500 mg by mouth daily TAKE 1 TABLET DAILY   . DULoxetine (CYMBALTA) 20 MG DR capsule 20 MG ORAL QAM, 60 MG ORAL QPM   . HYDROcodone-acetaminophen (VICODIN) 5-500 MG per tablet TAKE 1 TABLET EVERY 4-6 HOURS PRN   . zolpidem (AMBIEN) 10 MG tablet Take 1/2 to 1 tablet at  bedtime prn sleep   . SUMAtriptan (IMITREX) 100 MG tablet TAKE 1 TABLET FOR MIGRAINE RELIEF. MAY REPEAT 2 HOURS LATER. MAXIMUM 200MG /DAY.   . butalbital-acetaminophen-caffeine (FIORICET, ESGIC) 50-325-40 MG per tablet 1 OR 2 TABLETS EVERY 4 TO 6 HOURS AS NEEDED FOR HEADACHE.   . B Complex Vitamins (VITAMIN B COMPLEX) capsule Take 2 capsules by mouth every 12 hours   Neuropathy multivitamin    . cyclobenzaprine (FLEXERIL) 5 MG tablet TAKE 1 TABLET 3 TIMES DAILY AS NEEDED.   Marland Kitchen ACZONE 5 % topical gel Apply  topically.   Marland Kitchen Dexlansoprazole (DEXILANT) 60 MG capsule TAKE 1 CAPSULE DAILY EVERY MORNING BEFORE BREAKFAST.   Marland Kitchen ZIANA gel Apply  topically.   . Cholecalciferol (VITAMIN D) 1000 UNIT tablet TAKE 1 TABLET DAILY.   . Magnesium 200 MG TABS TAKE 1 TABLET DAILY AS DIRECTED.   Marland Kitchen sucralfate (CARAFATE) 1 GM tablet TAKE 1 TABLET 4 TIMES DAILY PRN     No current facility-administered medications for this visit.     Objective:     Recent Lab Results:  Lab Results  Component Value Date    NA 133 11/20/2009    K 3.7 11/20/2009    CL 97 11/20/2009    CO2 29* 11/20/2009    UN 8 11/20/2009    CREAT 0.72 11/20/2009    VID25 62 04/08/2010    WBC 8.1 12/12/2009    HGB 13.7 12/12/2009    HCT 42 12/12/2009    PLT 326 12/12/2009    TSH 1.70 04/08/2010     Physical Exam:  Filed Vitals:    11/30/11 1612   BP: 100/70   Pulse: 72   Temp: 36.3 C (97.4 F)   Height: 1.651 m (5\' 5" )   Weight: 61.689 kg (136 lb)     SpO2 Readings from Last 3 Encounters:   07/30/11 97%   03/12/09 99%   08/19/08 97%     Estimated body mass index is 22.63 kg/(m^2) as calculated from the following:    Height as of this encounter: 1.651 m (5\' 5" ).    Weight as of this encounter: 61.689 kg (136 lb).    GENERAL: Oriented x3. NAD. Conversant. Pleasant.    HEENT: No JVD. No cervical adenopathy. Canals clear.  TM's without erythema.    CHEST: Good aeration. CTA. No Wheezes. No Rhonchi. No Crackles. No dullness or hyperresonance to percussion. Chest wall nontender.    CARDIAC: RRR.  Normal S1 and S2. No murmur, rub, or gallops.    ABDOMEN: Flat, BS present. No bruits. Soft. Nontender to palpation.    Assessment:     1. Acute sinusitis      Plan:     Orders Placed This Encounter   . azithromycin (ZITHROMAX) 250 MG tablet   . mometasone (NASONEX) 50 MCG/ACT nasal spray     Sinusitis Plan  Home and rest.  Push fluids (will help to moisten and loosen secretions).  Decongestant OTC for sinus pressure/ headache and stuffy nose (caution if hypertension).  Guaifenesin (Mucinex) OTC can be used to loosen plegm and secretions.  Complete antibiotic.  Could try sinus irrigation with OTC kit.  Try nasonex.  Saline nasal spray.  Detailed patient handout provided summarizing  OTC medications for cold/flu/sinus symptoms.  Would expect improvement to resolution over 5-10 days.  Call if worsening cough, fever, chills, shortness of breath, or purulent secretions.    Medications were reviewed and changes were made as detailed below:  Patient's Medications   New Prescriptions    AZITHROMYCIN (ZITHROMAX) 250 MG TABLET    Take 2 tablets (500 mg) on day 1, followed by 1 tablet (250 mg) on days 2 through 5.    MOMETASONE (NASONEX) 50 MCG/ACT NASAL SPRAY    2 sprays by Each Nare route daily   Previous Medications    7-KETO DHEA    Take 100 mg by mouth daily    ACZONE 5 % TOPICAL GEL    Apply  topically.    B COMPLEX VITAMINS (VITAMIN B COMPLEX) CAPSULE    Take 2 capsules by mouth every 12 hours   Neuropathy multivitamin     BUTALBITAL-ACETAMINOPHEN-CAFFEINE (FIORICET, ESGIC) 50-325-40 MG PER TABLET    1 OR 2 TABLETS EVERY 4 TO 6 HOURS AS NEEDED FOR HEADACHE.    CHOLECALCIFEROL (VITAMIN D) 1000 UNIT TABLET    TAKE 1 TABLET DAILY.    CYCLOBENZAPRINE (FLEXERIL) 5 MG TABLET    TAKE 1 TABLET 3 TIMES DAILY AS NEEDED.    DEXLANSOPRAZOLE (DEXILANT) 60 MG CAPSULE    TAKE 1 CAPSULE DAILY EVERY MORNING BEFORE BREAKFAST.  DIGESTIVE ENZYMES PO    Take 1 capsule by mouth daily    DULOXETINE (CYMBALTA) 20 MG DR CAPSULE    20 MG ORAL  QAM, 60 MG ORAL QPM    FLUTICASONE (FLOVENT HFA) 110 MCG/ACT INHALER    INHALE 2 PUFFS TWICE DAILY    GABAPENTIN (NEURONTIN) 100 MG CAPSULE    Take 2 capsules at bedtime.    HYDROCODONE-ACETAMINOPHEN (VICODIN) 5-500 MG PER TABLET    TAKE 1 TABLET EVERY 4-6 HOURS PRN    MAGNESIUM 200 MG TABS    TAKE 1 TABLET DAILY AS DIRECTED.    PROBIOTIC PRODUCT (PROBIOTIC FORMULA PO)    Take 1 capsule by mouth daily    SUCRALFATE (CARAFATE) 1 GM TABLET    TAKE 1 TABLET 4 TIMES DAILY PRN    SUMATRIPTAN (IMITREX) 100 MG TABLET    TAKE 1 TABLET FOR MIGRAINE RELIEF. MAY REPEAT 2 HOURS LATER. MAXIMUM 200MG /DAY.    VALACYCLOVIR (VALTREX) 1 GM TABLET    Take 500 mg by mouth daily TAKE 1 TABLET DAILY    ZIANA GEL    Apply  topically.    ZOLPIDEM (AMBIEN) 10 MG TABLET    Take 1/2 to 1 tablet at bedtime prn sleep   Modified Medications    No medications on file   Discontinued Medications    No medications on file

## 2011-11-30 NOTE — Patient Instructions (Signed)
Sinusitis Plan  Home and rest.  Push fluids (will help to moisten and loosen secretions).  Decongestant OTC for sinus pressure/ headache and stuffy nose (caution if hypertension).  Guaifenesin (Mucinex) OTC can be used to loosen plegm and secretions.  Complete antibiotic.  Could try sinus irrigation with OTC kit.  Try nasonex.  Saline nasal spray.  Detailed patient handout provided summarizing  OTC medications for cold/flu/sinus symptoms.  Would expect improvement to resolution over 5-10 days.  Call if worsening cough, fever, chills, shortness of breath, or purulent secretions.

## 2011-12-10 ENCOUNTER — Encounter: Payer: Self-pay | Admitting: Gastroenterology

## 2011-12-11 LAB — HM MAMMOGRAPHY

## 2011-12-20 ENCOUNTER — Encounter: Payer: Self-pay | Admitting: Primary Care

## 2012-01-04 ENCOUNTER — Encounter: Payer: Self-pay | Admitting: Gastroenterology

## 2012-01-10 ENCOUNTER — Ambulatory Visit: Payer: Self-pay | Admitting: Colorectal Surgery

## 2012-01-10 ENCOUNTER — Encounter: Payer: Self-pay | Admitting: Colorectal Surgery

## 2012-01-10 VITALS — BP 110/71 | HR 71 | Temp 98.7°F | Ht 65.0 in | Wt 137.0 lb

## 2012-01-10 DIAGNOSIS — R159 Full incontinence of feces: Secondary | ICD-10-CM | POA: Insufficient documentation

## 2012-01-10 NOTE — Progress Notes (Signed)
PATIENTSOPHY, [name]  MR #:  [id number]   ACCOUNT #:  [id number] DOB:  May 28, 1956   DICTATED BY:  Eugenie Norrie, MD DATE OF VISIT:  01/10/2012     HISTORY:  [name] comes into the office.  She states that not much is different.  She states she has she tried [place] initially and seemed to work initially and then started having more gassy episodes.  She states that she has 6-7 time bowel movements per day.  She states she goes a little bit and has some gas and then we will not and does not improve.  She states that she is still having gas incontinence daily.  She is having stool incontinence at least 3 times per week.  We reviewed her defecography with her and basically showed a residual stool and an anterior rectocele, although she evacuated over 95% of her contrast.  She states that at the current time she is very unhappy with her incontinence so therefore I have given her more information about sacral nerve stimulation.  In addition, I think she should increase her Gas-X consumption.  She can take 2 with meals and 2 at bedtime.  In addition, if possibly she tries a suppository or an enema, this may helpful her fully evacuate her rectocele as well as she may try splinting and she does not want to undergo any further procedures for rectocele. I informed her that at this current time she can try different conservative measures for her defecatory dysfunction and if this improves her symptoms, that will be very good.  In addition, she can also try biofeedback if she needs to as well as if she decides to undergo sacral nerve stimulation, she should contact our office.  She will follow up with [place] on an as-needed basis.             ______________________________  Eugenie Norrie, MD    JS/MODL  DD:  01/10/2012 16:35:21  DT:  01/10/2012 20:53:02  Job #:  [id number]    cc: Robbi Garter, MD   [address] [place], [place] [address]

## 2012-01-10 NOTE — Progress Notes (Signed)
This office note has been dictated.

## 2012-01-11 ENCOUNTER — Telehealth: Payer: Self-pay | Admitting: Primary Care

## 2012-01-11 NOTE — Telephone Encounter (Signed)
Patient is not sleeping well.  She was seen by [facility] who recommended tapering off of Cymbalta as it does not appear to be helping her depression and switching to mirtazapine.  She will call once she is down lower on Cymbalta and then will discuss trial of mirtazapine.

## 2012-01-11 NOTE — Telephone Encounter (Signed)
Patient called and states the JA must have received a consult from Dr. Heide Guile, and is requesting a call back to speak with [facility],    Patient [phone]

## 2012-01-18 ENCOUNTER — Ambulatory Visit: Payer: Self-pay | Admitting: Allergy/Immunology/Rheumatology

## 2012-01-19 DIAGNOSIS — G629 Polyneuropathy, unspecified: Secondary | ICD-10-CM | POA: Insufficient documentation

## 2012-01-20 ENCOUNTER — Other Ambulatory Visit: Payer: Self-pay | Admitting: Primary Care

## 2012-01-20 ENCOUNTER — Encounter: Payer: Self-pay | Admitting: Gastroenterology

## 2012-01-20 MED ORDER — MIRTAZAPINE 15 MG PO TABS *I*
15.0000 mg | ORAL_TABLET | Freq: Every evening | ORAL | Status: DC
Start: 2012-01-20 — End: 2012-03-10

## 2012-01-20 MED ORDER — HYDROCODONE-ACETAMINOPHEN 5-325 MG PO TABS *I*
1.0000 | ORAL_TABLET | Freq: Four times a day (QID) | ORAL | Status: DC | PRN
Start: 2012-01-20 — End: 2012-06-09

## 2012-01-20 NOTE — Telephone Encounter (Signed)
Put up front 

## 2012-01-21 ENCOUNTER — Ambulatory Visit
Admit: 2012-01-21 | Discharge: 2012-01-23 | Disposition: A | Discharge status: Home / Self Care | Payer: Self-pay | Source: Ambulatory Visit | Attending: Pathology | Admitting: Pathology

## 2012-01-21 NOTE — Telephone Encounter (Signed)
Pt picked up script today, id presented.

## 2012-02-09 ENCOUNTER — Ambulatory Visit: Payer: Self-pay | Admitting: Surgery

## 2012-02-09 ENCOUNTER — Encounter: Payer: Self-pay | Admitting: Surgery

## 2012-02-09 VITALS — BP 100/72 | Ht 65.0 in | Wt 135.0 lb

## 2012-02-09 DIAGNOSIS — L719 Rosacea, unspecified: Secondary | ICD-10-CM

## 2012-02-09 DIAGNOSIS — Z808 Family history of malignant neoplasm of other organs or systems: Secondary | ICD-10-CM

## 2012-02-09 DIAGNOSIS — L309 Dermatitis, unspecified: Secondary | ICD-10-CM

## 2012-02-09 DIAGNOSIS — L709 Acne, unspecified: Secondary | ICD-10-CM

## 2012-02-09 HISTORY — DX: Dermatitis, unspecified: L30.9

## 2012-02-09 MED ORDER — METRONIDAZOLE 1 % EX GEL *I*
Freq: Two times a day (BID) | CUTANEOUS | Status: DC
Start: 2012-02-09 — End: 2012-12-26

## 2012-02-09 NOTE — Progress Notes (Signed)
Consulting Physician:   Arlyss Queen, MD    Chief Complaint   Patient presents with   . Acne         Identifiers:  2 Patient identification verification occurred.  Barriers to learning:  None    HPI:  [name] is a 56 y.o. year old female  referred by Arlyss Queen, MD  for evaluation of acne that has been present for most of her life.  She has taken accutane twice, but had to d/c d/t elevated transaminases, spirinolactone which worked the best most recently but was discontinued d/t neuropathy, tetracyclines, bactrim (now allergic).  The patient complains of flaring and has tried topical zianna, aczone to alleviate symptoms without relief.  She is perimenapausal, there routine to the flare ups.  She does not want to be on oral agents if possible.    She also c/o periodic isolated red lesions of the right hip and legs that are sometimes pruritic.  No treatment.  They disappear on her own.    She has a history of AK's, but had her FSE earlier this year.    ROS:   Otherwise well, other than the above stated in the HPI.      Past Medical/Surgical History: The patient's electronic medical record was reviewed.  Past Medical History   Diagnosis Date   . Back pain      Resolved   . Migraine    . GERD (gastroesophageal reflux disease)    . Depression    . Neuropathy    . Gout    . Acne    . Complication of anesthesia      Novacaine Dizzy   . Herpes      Genital   . Urticaria    . Actinic keratosis    . Varicella    . Allergy history unknown    . STD (sexually transmitted disease)      Gential Herpes   . Dermatitis 02/09/2012     Past Surgical History   Procedure Laterality Date   . Rectocele repair  2010   . Incontinence surgery  2010   . Sphincteroplasty  2010     ABSCESS AFTER SURGERY   . Endometrial ablation  2009   . Nasal polyp surgery  2003   . Cesarean section, classic  58     Family History   Problem Relation Age of Onset   . Other Other      Fascioscapulohumeral Progressive Muscular Dystrophy   . Other  Other      Wegener's Granulomatosis   . Other Father      numbness and pain in limbs   . Heart disease Father    . Cancer Father      Basal cell   . Other Sister      numbness and pain in limbs   . High blood pressure Sister    . Cancer Mother      Melanoma   . Lupus Other    . Eczema Other          Family History:    The electronic medical record was reviewed as above    Social History:    The electronic medical record from today was reviewed    Labs/Imaging:       Physical Exam:    The patient is alert, well-nourished, well-developed, no acute distress   All of the following were examined and were within normal limits, except as noted:  Face, Ears, Neck including grossly normal thyroid, Scalp:  Cystic papules of the bilateral cheeks and chin region.  Some hyperpigmented macules noted.  No erythema of the malar region.  Hair, Eyebrows:  Eyes/ Lids:     Lips:  Chest:       Right/ Left Arms/ Hands/ Nails x all/  Digits x all:     Right/ Left Legs/ Feet/ Toenails x all/ Toe digits x all:  One erythematous papule of the RLE      Assessment/Plan:  Encounter Diagnoses   Name Primary?   . Acne Yes   . Rosacea    . Dermatitis    . Family history of melanoma      For the Acne/Rosacea we will trial metrogel.  F/u in six weeks, if not improved we may trial elidel, or sulfacetamide.  Dermatitis, will monitor, there is just one lesion today.  She will have a FSE on an annual basis, deferred a complete exam today.  She has no personal history of skin cancer so this is fine.

## 2012-02-21 ENCOUNTER — Encounter: Payer: Self-pay | Admitting: Gastroenterology

## 2012-03-02 ENCOUNTER — Encounter: Payer: Self-pay | Admitting: Gastroenterology

## 2012-03-06 ENCOUNTER — Telehealth: Payer: Self-pay | Admitting: Primary Care

## 2012-03-06 NOTE — Telephone Encounter (Signed)
Patient called and scheduled an appt on 11/22 at 4:00PM in regards to her Fibromyalgia. Patient is requesting to be seen sooner if at all possible. She would prefer to be seen on a Friday. Otherwise, she will come to the scheduled appt. Please advise. Patient [phone]

## 2012-03-06 NOTE — Telephone Encounter (Signed)
lmor for pt to call back

## 2012-03-06 NOTE — Telephone Encounter (Signed)
Patient scheduled on 10/11 at 1:00PM.

## 2012-03-06 NOTE — Telephone Encounter (Signed)
I can see her this Friday if she wants at 1 o'clock (EOV).

## 2012-03-10 ENCOUNTER — Encounter: Payer: Self-pay | Admitting: Primary Care

## 2012-03-10 ENCOUNTER — Ambulatory Visit: Payer: Self-pay | Admitting: Primary Care

## 2012-03-10 VITALS — BP 124/80 | HR 72 | Ht 64.96 in | Wt 137.0 lb

## 2012-03-10 DIAGNOSIS — M797 Fibromyalgia: Secondary | ICD-10-CM | POA: Insufficient documentation

## 2012-03-10 DIAGNOSIS — F3289 Other specified depressive episodes: Secondary | ICD-10-CM

## 2012-03-10 DIAGNOSIS — Z0000 Encounter for general adult medical examination without abnormal findings: Secondary | ICD-10-CM

## 2012-03-10 MED ORDER — HYOSCYAMINE SULFATE 0.125 MG PO TBDP *A*
1.0000 | ORAL_TABLET | ORAL | Status: DC | PRN
Start: 2012-03-10 — End: 2013-10-19

## 2012-03-10 MED ORDER — VENLAFAXINE HCL 37.5 MG PO CP24 *I*
ORAL_CAPSULE | ORAL | Status: DC
Start: 2012-03-10 — End: 2012-03-24

## 2012-03-11 NOTE — Progress Notes (Signed)
Subjective:      Patient ID: [name] is a 56 y.o. year old female.    Chief Complaint   Patient presents with   . Follow-up     fibromyalgia and depression       HPI:    The patient is a 56 year old woman who comes in today for follow-up of fibromyalgia and chronic depression.    She is under work related stress and is more depressed and anxious.  She has tried a number of medications in the past without any consistent help.  She most recently was on Cymbalta but that did not help and she did have difficulty coming off of it.     She has multiple aches and pains from her fibromyalgia.  She does see a chiropractor 1 or 2 times a week for left sacroiliac pain and back pain.    She has a small fiber sensory neuropathy.  She complains of burning of her toes and fingers.  She is on 400 mg of gabapentin at bedtime which helps.  She has difficulty tolerating higher doses.  She is sleeping better on gabapentin.    Her irritable bowel syndrome is worse.    She has a chronically elevated CK level without any evidence of an underlying myopathy.  She has been evaluated by rheumatology.    Allergy /Medications/ Problem List/ Social History / :  Allergies   Allergen Reactions   . Demerol Nausea And Vomiting   . Environmental Allergies    . Erythromycin      ABDOMINAL PAIN   . Gluten Meal      BLOATING, SNEEZING, WATERY EYES, NEUROPATHY FLARE   . Sulfa Drugs Hives     Current Outpatient Prescriptions on File Prior to Visit   Medication Sig Dispense Refill   . progesterone (PROMETRIUM) 200 MG capsule Take 200 mg by mouth at bedtime       . cetirizine (ZYRTEC) 10 MG tablet Take 10 mg by mouth daily       . metroNIDAZOLE (METROGEL) 1 % gel Apply topically 2 times daily  55 g  5   . HYDROcodone-acetaminophen (NORCO) 5-325 MG per tablet Take 1 tablet by mouth every 6 hours as needed for Pain   MDD 4 tablets  30 tablet  0   . fluticasone (FLOVENT HFA) 110 MCG/ACT inhaler INHALE 2 PUFFS TWICE DAILY  1 Inhaler  5   . mometasone  (NASONEX) 50 MCG/ACT nasal spray 2 sprays by Each Nare route daily  17 g  1   . 7-KETO DHEA Take 100 mg by mouth daily       . Probiotic Product (PROBIOTIC FORMULA PO) Take 1 capsule by mouth daily       . DIGESTIVE ENZYMES PO Take 1 capsule by mouth daily       . valACYclovir (VALTREX) 1 GM tablet Take 500 mg by mouth daily TAKE 1 TABLET DAILY       . zolpidem (AMBIEN) 10 MG tablet Take 1/2 to 1 tablet at bedtime prn sleep  30 tablet  2   . SUMAtriptan (IMITREX) 100 MG tablet TAKE 1 TABLET FOR MIGRAINE RELIEF. MAY REPEAT 2 HOURS LATER. MAXIMUM 200MG /DAY.  9 tablet  5   . butalbital-acetaminophen-caffeine (FIORICET, ESGIC) 50-325-40 MG per tablet 1 OR 2 TABLETS EVERY 4 TO 6 HOURS AS NEEDED FOR HEADACHE.  30 tablet  1   . B Complex Vitamins (VITAMIN B COMPLEX) capsule Take 2 capsules by mouth every 12 hours  Neuropathy multivitamin        . Dexlansoprazole (DEXILANT) 60 MG capsule TAKE 1 CAPSULE DAILY EVERY MORNING BEFORE BREAKFAST.  90  3   . Cholecalciferol (VITAMIN D) 1000 UNIT tablet TAKE 1 TABLET DAILY.    0   . Magnesium 200 MG TABS TAKE 1 TABLET DAILY AS DIRECTED.    0     No current facility-administered medications on file prior to visit.     Patient Active Problem List   Diagnosis Code   . Depression 311   . Internal Hemorrhoids 455.0   . Esophageal reflux 530.81   . Acne 706.1   . Anxiety 300.00   . 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 Joint Pain, Localized In The Knee 719.46   . Allergic Rhinitis 477.9   . Irritable bowel syndrome 564.1   . Gastroparesis 536.3   . Migraine Headache 346.90   . Tingling (Paresthesia) 782.0   . Epigastric pain 789.06   . 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 Family history of muscular dystrophy V17.2   . Elevated CK 790.5   . Fecal incontinence 787.60   . Rosacea 695.3   . Family history of melanoma V16.8   . Dermatitis 692.9   . Fibromyalgia 729.1     History   Substance Use Topics   . Smoking status: Former Smoker     Types:  Cigarettes     Quit date: 10/07/1980   . Smokeless tobacco: Never Used    Comment: rare cigarettes   . Alcohol Use: 3.5 oz/week     7 drink(s) per week         Objective:     Recent Lab Results:  Lab Results   Component Value Date    NA 133 11/20/2009    K 3.7 11/20/2009    CL 97 11/20/2009    CO2 29* 11/20/2009    UN 8 11/20/2009    CREAT 0.72 11/20/2009    VID25 62 04/08/2010    WBC 8.1 12/12/2009    HGB 13.7 12/12/2009    HCT 42 12/12/2009    PLT 326 12/12/2009    TSH 1.70 04/08/2010     Physical Exam:  Filed Vitals:    03/10/12 1258   BP: 124/80   Pulse: 72   Height: 1.65 m (5' 4.96")   Weight: 62.143 kg (137 lb)      Estimated body mass index is 22.83 kg/(m^2) as calculated from the following:    Height as of this encounter: 1.65 m (5' 4.96").    Weight as of this encounter: 62.143 kg (137 lb).     The patient is a pleasant 56 year old woman who appears well.  Her lungs are clear.  Cardiac exam shows a regular rhythm.  There are multiple tender trigger points.    Assessment / Plan:     1.  Fibromyalgia is moderately symptomatic.  She will continue symptomatic management.    2.  Chronic stress and depression.  --She was counseled regarding her stress.  --Trial of venlafaxine extended release 37.5 mg starting at one tablet a day and then increasing to 2 tablets if tolerated.  --She will call in 3 weeks regarding her progress.    3.  Elevated CK level and a small fiber sensory neuropathy.  There is no evidence for an underlying myopathy.  There is a family history of facioscapulohumeral muscular dystrophy.  The patient has had no evidence of this.  --She will  try to increase gabapentin to 600 mg at bedtime for her neuropathy.  --Neurology re-evaluation will be considered.    Flu vaccine was given today.         Orders Placed This Encounter   Procedures   . Flu vaccine Quadrivalent greater than or equal to 3yo preservative free IM (AMBULATORY USE ONLY)

## 2012-03-16 ENCOUNTER — Encounter: Payer: Self-pay | Admitting: Gastroenterology

## 2012-03-17 ENCOUNTER — Encounter: Payer: Self-pay | Admitting: Gastroenterology

## 2012-03-17 ENCOUNTER — Other Ambulatory Visit: Payer: Self-pay | Admitting: Primary Care

## 2012-03-24 ENCOUNTER — Ambulatory Visit: Payer: Self-pay | Admitting: Surgery

## 2012-03-24 ENCOUNTER — Other Ambulatory Visit: Payer: Self-pay | Admitting: Primary Care

## 2012-03-24 MED ORDER — VENLAFAXINE HCL 37.5 MG PO CP24 *I*
ORAL_CAPSULE | ORAL | Status: DC
Start: 2012-03-24 — End: 2012-10-30

## 2012-03-28 ENCOUNTER — Telehealth: Payer: Self-pay | Admitting: Primary Care

## 2012-03-28 NOTE — Telephone Encounter (Signed)
Patient called requesting a letter for her employer stating when she had a flu shot administered (10/11). Patient needs that signed by PCP. She would like that mailed to her home address. Please advise. Patient [phone]

## 2012-03-28 NOTE — Telephone Encounter (Signed)
Letter mailed

## 2012-03-28 NOTE — Telephone Encounter (Signed)
Can you please mail her a letter stating that she had her flu shot here.  I can sign if needed.

## 2012-04-14 ENCOUNTER — Encounter: Payer: Self-pay | Admitting: Gastroenterology

## 2012-04-21 ENCOUNTER — Ambulatory Visit: Payer: Self-pay | Admitting: Primary Care

## 2012-05-10 ENCOUNTER — Other Ambulatory Visit: Payer: Self-pay | Admitting: Primary Care

## 2012-05-10 MED ORDER — AMOXICILLIN 500 MG PO CAPS *I*
500.0000 mg | ORAL_CAPSULE | Freq: Three times a day (TID) | ORAL | Status: DC
Start: 2012-05-10 — End: 2012-05-17

## 2012-05-17 ENCOUNTER — Other Ambulatory Visit: Payer: Self-pay | Admitting: Primary Care

## 2012-05-17 MED ORDER — AZITHROMYCIN 250 MG PO TABS *I*
ORAL_TABLET | ORAL | Status: DC
Start: 2012-05-17 — End: 2012-06-09

## 2012-06-09 ENCOUNTER — Encounter: Payer: Self-pay | Admitting: Primary Care

## 2012-06-09 ENCOUNTER — Ambulatory Visit: Payer: Self-pay | Admitting: Primary Care

## 2012-06-09 VITALS — BP 120/70 | HR 65 | Temp 96.8°F | Ht 64.96 in | Wt 140.0 lb

## 2012-06-09 DIAGNOSIS — J9801 Acute bronchospasm: Secondary | ICD-10-CM

## 2012-06-09 DIAGNOSIS — R0989 Other specified symptoms and signs involving the circulatory and respiratory systems: Secondary | ICD-10-CM

## 2012-06-09 MED ORDER — GABAPENTIN 300 MG PO CAPS
300.0000 mg | ORAL_CAPSULE | Freq: Two times a day (BID) | ORAL | Status: AC
Start: 2012-06-09 — End: 2012-09-07

## 2012-06-09 NOTE — Progress Notes (Signed)
Subjective:      Patient ID: [name] is a 57 y.o. year old female.    Chief Complaint   Patient presents with   . Other     congestion     HPI:    Ongoing congestion since December 5th.  Was started on Amoxicillin by PMD, but stopped after developing Hives.  She felt a bit better, but then after getting home there was recurrent congestion so called PMD and was placed on Z-pack - took for 3 days and developed hives again, so she stopped the Z-pack.  She has a long history of recurrent hives - so, she is not convinced that she has an allergy to Zithromax.  She tells me that it's not unusual for her to get hives so it may not have been related to the antibiotic.    After the Z-pack there was residual congestion in the head and chest.  Also, there has been some mild nausea.  Started feeling worse last week so she started homeopathic meds, "detox."  She was starting to feel better Wednesday and Thursday.  This morning there was more congestion in the chest and the head.  There has been a worsening sense of nausea - not like she would throw up.  No abdominal pain.  She is coughing a lot, but not bringing anything up.  There has not been any colored mucous at any point.  Perhaps, when she was in [place] just before the Amoxicillin there was 1-2 days of darker mucous.    She does have history of allergic rhinitis, but not in the winter.    Allergy / Social History / Medications:  Allergies   Allergen Reactions   . Amoxicillin Hives   . Demerol Nausea And Vomiting   . Environmental Allergies    . Erythromycin      ABDOMINAL PAIN   . Gluten Meal      BLOATING, SNEEZING, WATERY EYES, NEUROPATHY FLARE   . Sulfa Drugs Hives     History   Substance Use Topics   . Smoking status: Former Smoker     Types: Cigarettes     Quit date: 10/07/1980   . Smokeless tobacco: Never Used      Comment: rare cigarettes   . Alcohol Use: 3.5 oz/week     7 drink(s) per week     Current Outpatient Prescriptions   Medication Sig   .  venlafaxine (EFFEXOR-XR) 37.5 MG 24 hr capsule 3 capsules a day as directed.  Swallow whole. Do not crush or chew.   . gabapentin (NEURONTIN) 100 MG capsule 4 capsules at bedtime   . gabapentin (NEURONTIN) 100 MG capsule Take 4 capsules at bedtime.   Marland Kitchen Hyoscyamine Sulfate 0.125 MG TBDP Take 1 tablet by mouth Q4-6H PRN   . progesterone (PROMETRIUM) 200 MG capsule Take 200 mg by mouth at bedtime   . cetirizine (ZYRTEC) 10 MG tablet Take 10 mg by mouth daily   . metroNIDAZOLE (METROGEL) 1 % gel Apply topically 2 times daily   . fluticasone (FLOVENT HFA) 110 MCG/ACT inhaler INHALE 2 PUFFS TWICE DAILY   . mometasone (NASONEX) 50 MCG/ACT nasal spray 2 sprays by Each Nare route daily   . 7-KETO DHEA Take 100 mg by mouth daily   . Probiotic Product (PROBIOTIC FORMULA PO) Take 1 capsule by mouth daily   . DIGESTIVE ENZYMES PO Take 1 capsule by mouth daily   . valACYclovir (VALTREX) 1 GM tablet Take 500 mg by mouth  daily TAKE 1 TABLET DAILY   . zolpidem (AMBIEN) 10 MG tablet Take 1/2 to 1 tablet at bedtime prn sleep   . SUMAtriptan (IMITREX) 100 MG tablet TAKE 1 TABLET FOR MIGRAINE RELIEF. MAY REPEAT 2 HOURS LATER. MAXIMUM 200MG /DAY.   . butalbital-acetaminophen-caffeine (FIORICET, ESGIC) 50-325-40 MG per tablet 1 OR 2 TABLETS EVERY 4 TO 6 HOURS AS NEEDED FOR HEADACHE.   . B Complex Vitamins (VITAMIN B COMPLEX) capsule Take 2 capsules by mouth every 12 hours   Neuropathy multivitamin    . Dexlansoprazole (DEXILANT) 60 MG capsule TAKE 1 CAPSULE DAILY EVERY MORNING BEFORE BREAKFAST.   Marland Kitchen Cholecalciferol (VITAMIN D) 1000 UNIT tablet TAKE 1 TABLET DAILY.   . Magnesium 200 MG TABS TAKE 1 TABLET DAILY AS DIRECTED.     No current facility-administered medications for this visit.     Objective:     Recent Lab Results:  Lab Results   Component Value Date    NA 133 11/20/2009    K 3.7 11/20/2009    CL 97 11/20/2009    CO2 29* 11/20/2009    UN 8 11/20/2009    CREAT 0.72 11/20/2009    VID25 62 04/08/2010    WBC 8.1 12/12/2009    HGB 13.7  12/12/2009    HCT 42 12/12/2009    PLT 326 12/12/2009    TSH 1.70 04/08/2010     Physical Exam:  Filed Vitals:    06/09/12 1629   BP: 120/70   Pulse: 65   Temp: 36 C (96.8 F)   Height: 1.65 m (5' 4.96")   Weight: 63.504 kg (140 lb)     SpO2 Readings from Last 3 Encounters:   06/09/12 98%   01/10/12 100%   07/30/11 97%     Estimated body mass index is 23.33 kg/(m^2) as calculated from the following:    Height as of this encounter: 1.65 m (5' 4.96").    Weight as of this encounter: 63.504 kg (140 lb).    GENERAL: Oriented x3. NAD. Conversant. Pleasant.  She does sound congested.    HEENT: PERRLADC. EOMI. Ear canals clear. TM's without erythema or fluid levels. Nares patent. Oropharynx clear without swelling/erythema/exudate. No cervical adenopathy.         R.  Frontal Sinus: NonTender / Transillumination intact.       L.  Frontal Sinus: NonTender / Transillumination intact.       R.  Maxillary Sinus: NonTender / Transillumination intact.       L.   Maxillary Sinus: NonTender / Transillumination intact.    CHEST: Good aeration. CTA. No dullness to percussion.    CARDIAC: RRR.    Assessment / Plan:     1. Sinus congestion    2. Chest congestion    3. Bronchospasm        Well, I don't think there is any bacterial component at this juncture with regard to her sinus and chest congestion.  Rather, I think were simply dealing with residual inflammation of the sinus passages and upper bronchials with postnasal drip and a component of reactive airway.  Sounds as though her cough and congestion have been improving which was on the Flovent, but then she discontinued it.  So, asked her to restart the Flovent.  Sometimes she feels that her Zyrtec that she uses for allergies is too potent so she's getting try Allegra 60 mg one tablet once or twice a day in its place to see if this works better.  In addition  to this she can restart her Nasonex nasal spray.  She will let me know she is not making sufficient progress.    At the end of  her car she tells me that she was directed to increase her gabapentin to 300 mg twice a day by her pmd.  She's been using the 100 mg tablets and is due for refill.  She would like to change the 300 mg tablet to make dosing much easier.      Medications were reviewed and changes were made as detailed below:  Patient's Medications   New Prescriptions    GABAPENTIN (NEURONTIN) 300 MG CAPSULE    Take 1 capsule (300 mg total) by mouth 2 times daily   Previous Medications    7-KETO DHEA    Take 100 mg by mouth daily    B COMPLEX VITAMINS (VITAMIN B COMPLEX) CAPSULE    Take 2 capsules by mouth every 12 hours   Neuropathy multivitamin     BUTALBITAL-ACETAMINOPHEN-CAFFEINE (FIORICET, ESGIC) 50-325-40 MG PER TABLET    1 OR 2 TABLETS EVERY 4 TO 6 HOURS AS NEEDED FOR HEADACHE.    CETIRIZINE (ZYRTEC) 10 MG TABLET    Take 10 mg by mouth daily    CHOLECALCIFEROL (VITAMIN D) 1000 UNIT TABLET    TAKE 1 TABLET DAILY.    DEXLANSOPRAZOLE (DEXILANT) 60 MG CAPSULE    TAKE 1 CAPSULE DAILY EVERY MORNING BEFORE BREAKFAST.    DIGESTIVE ENZYMES PO    Take 1 capsule by mouth daily    FLUTICASONE (FLOVENT HFA) 110 MCG/ACT INHALER    INHALE 2 PUFFS TWICE DAILY    GABAPENTIN (NEURONTIN) 100 MG CAPSULE    Take 4 capsules at bedtime.    HYOSCYAMINE SULFATE 0.125 MG TBDP    Take 1 tablet by mouth Q4-6H PRN    MAGNESIUM 200 MG TABS    TAKE 1 TABLET DAILY AS DIRECTED.    METRONIDAZOLE (METROGEL) 1 % GEL    Apply topically 2 times daily    MOMETASONE (NASONEX) 50 MCG/ACT NASAL SPRAY    2 sprays by Each Nare route daily    PROBIOTIC PRODUCT (PROBIOTIC FORMULA PO)    Take 1 capsule by mouth daily    PROGESTERONE (PROMETRIUM) 200 MG CAPSULE    Take 200 mg by mouth at bedtime    SUMATRIPTAN (IMITREX) 100 MG TABLET    TAKE 1 TABLET FOR MIGRAINE RELIEF. MAY REPEAT 2 HOURS LATER. MAXIMUM 200MG /DAY.    VALACYCLOVIR (VALTREX) 1 GM TABLET    Take 500 mg by mouth daily TAKE 1 TABLET DAILY    VENLAFAXINE (EFFEXOR-XR) 37.5 MG 24 HR CAPSULE    3 capsules a day as  directed.  Swallow whole. Do not crush or chew.    ZOLPIDEM (AMBIEN) 10 MG TABLET    Take 1/2 to 1 tablet at bedtime prn sleep   Modified Medications    No medications on file   Discontinued Medications    AZITHROMYCIN (ZITHROMAX) 250 MG TABLET    Take 2 tablets (500 mg) by mouth on Day 1, then take 1 tablet (250 mg) daily until all are taken.    GABAPENTIN (NEURONTIN) 100 MG CAPSULE    4 capsules at bedtime    HYDROCODONE-ACETAMINOPHEN (NORCO) 5-325 MG PER TABLET    Take 1 tablet by mouth every 6 hours as needed for Pain   MDD 4 tablets

## 2012-07-03 ENCOUNTER — Other Ambulatory Visit: Payer: Self-pay | Admitting: Primary Care

## 2012-07-03 MED ORDER — VALACYCLOVIR HCL 1000 MG PO TABS *I*
ORAL_TABLET | ORAL | Status: DC
Start: 2012-07-03 — End: 2013-02-27

## 2012-07-03 NOTE — Telephone Encounter (Signed)
POF REQUESTS 90 DAY SUPPLY WITH REFILLS

## 2012-07-11 ENCOUNTER — Other Ambulatory Visit: Payer: Self-pay | Admitting: Primary Care

## 2012-07-11 MED ORDER — DEXLANSOPRAZOLE 60 MG PO CPDR *I*
DELAYED_RELEASE_CAPSULE | ORAL | Status: DC
Start: 2012-07-11 — End: 2013-10-19

## 2012-07-21 ENCOUNTER — Encounter: Payer: Self-pay | Admitting: Gastroenterology

## 2012-07-27 ENCOUNTER — Encounter: Payer: Self-pay | Admitting: Gastroenterology

## 2012-08-01 ENCOUNTER — Encounter: Payer: Self-pay | Admitting: Gastroenterology

## 2012-09-05 ENCOUNTER — Other Ambulatory Visit: Payer: Self-pay | Admitting: Primary Care

## 2012-09-05 MED ORDER — GUAIFENESIN-CODEINE 100-10 MG/5ML PO SYRP *I*
10.0000 mL | ORAL_SOLUTION | ORAL | Status: DC | PRN
Start: 2012-09-05 — End: 2012-10-30

## 2012-09-25 ENCOUNTER — Encounter: Payer: Self-pay | Admitting: Gastroenterology

## 2012-09-25 ENCOUNTER — Ambulatory Visit
Admit: 2012-09-25 | Discharge: 2012-09-25 | Disposition: A | Discharge status: Home / Self Care | Payer: Self-pay | Source: Ambulatory Visit | Attending: Gastroenterology | Admitting: Gastroenterology

## 2012-09-27 LAB — SURGICAL PATHOLOGY

## 2012-10-20 ENCOUNTER — Other Ambulatory Visit: Payer: Self-pay | Admitting: Primary Care

## 2012-10-20 MED ORDER — CYCLOBENZAPRINE HCL 5 MG PO TABS *I*
5.0000 mg | ORAL_TABLET | Freq: Three times a day (TID) | ORAL | Status: DC | PRN
Start: 2012-10-20 — End: 2015-11-13

## 2012-10-20 MED ORDER — HYDROCODONE-ACETAMINOPHEN 5-325 MG PO TABS *I*
1.0000 | ORAL_TABLET | Freq: Four times a day (QID) | ORAL | Status: DC | PRN
Start: 2012-10-20 — End: 2012-12-13

## 2012-10-20 NOTE — Telephone Encounter (Signed)
Patient called to request refills for vicodin 5-325 and cyclobenzaprine 5mg   Both medications are discontinued on meds and orders list  Patient would like to pick up script today if possible   She will be in the area from 3-4 today  Please advise  [phone]

## 2012-10-20 NOTE — Telephone Encounter (Signed)
Please review, I did pend the two medications.

## 2012-10-24 ENCOUNTER — Telehealth: Payer: Self-pay | Admitting: Primary Care

## 2012-10-24 NOTE — Telephone Encounter (Signed)
Patient called requesting an appt in regards to increased fatigue. She declined appt with either PA, and is requesting to be seen by Dr. Oletta Cohn directly. Patient is available this coming Friday afternoon, any morning next week, or anytime next Friday. Please advise regarding scheduling. [phone]

## 2012-10-24 NOTE — Telephone Encounter (Signed)
See if she can come in 7:30 Mon, Tues or Wed next week.  If not, O.K 45 minute of morning CPX next week.

## 2012-10-25 NOTE — Telephone Encounter (Signed)
Left detailed message on patients answering machine regarding message below.

## 2012-10-25 NOTE — Telephone Encounter (Signed)
Call patient.  She had labs done 3 months ago so I would like to hold off until I see her so I know what more to order.

## 2012-10-25 NOTE — Telephone Encounter (Signed)
Patient was scheduled at 7:30AM on Monday, 6/2.     She is inquiring if Dr. Oletta Cohn would like an labs ordered prior to her appt. [phone]

## 2012-10-30 ENCOUNTER — Encounter: Payer: Self-pay | Admitting: Gastroenterology

## 2012-10-30 ENCOUNTER — Ambulatory Visit: Payer: Self-pay | Admitting: Primary Care

## 2012-10-30 ENCOUNTER — Telehealth: Payer: Self-pay | Admitting: Internal Medicine

## 2012-10-30 ENCOUNTER — Encounter: Payer: Self-pay | Admitting: Primary Care

## 2012-10-30 VITALS — BP 112/80 | HR 64 | Ht 64.96 in | Wt 138.0 lb

## 2012-10-30 DIAGNOSIS — R5383 Other fatigue: Secondary | ICD-10-CM

## 2012-10-30 DIAGNOSIS — M2550 Pain in unspecified joint: Secondary | ICD-10-CM

## 2012-10-30 DIAGNOSIS — G629 Polyneuropathy, unspecified: Secondary | ICD-10-CM

## 2012-10-30 DIAGNOSIS — R748 Abnormal levels of other serum enzymes: Secondary | ICD-10-CM

## 2012-10-30 MED ORDER — GABAPENTIN 300 MG PO CAPS
ORAL_CAPSULE | ORAL | Status: DC
Start: 2012-10-30 — End: 2013-08-10

## 2012-10-30 NOTE — Telephone Encounter (Signed)
Notified by ACM of panic high CK-MB of 16.2; this is actually the lowest it has been for her in "years" per the [facility] tech I spoke with.  Total CK 682.    Did not notify patient as this seems to be a chronic issue for her.

## 2012-11-12 ENCOUNTER — Encounter: Payer: Self-pay | Admitting: Primary Care

## 2012-11-12 NOTE — Progress Notes (Signed)
Subjective:      Patient ID: [name] is a 57 y.o. year old female.    Chief Complaint   Patient presents with   . Follow-up     Joint pain and neuropathy   . Fatigue       HPI:    The patient is a 57 year old woman who comes in today for follow-up of generalized pain and chronic neuropathy.    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    She complains of a flare-up of her pain over the last 3 weeks.  She feels as if she has a "hot iron" on her skin.  She has shooting pains in her arms and legs.  She has multiple joint pains especially in her hips and knees.  Her wrists and fingers are now becoming painful.  She has no associated swelling of her joints.    She feels exhausted and tired.    She occasionally has trouble sleeping.  Her short-term memory is decreased and she is not thinking as clearly.  She is having a harder time at work and she is interested in going out on short-term disability.    She is going for acupuncture, seeing a chiropractor and doing yoga without any help.    She wonders if she has Lyme disease as she has many of the symptoms.    She is off of venlafaxine.    The exacerbation of her symptoms started after her office was renovated and she wonders if there is a connection.    Allergy /Medications/ Problem List/ Social History / :  Allergies   Allergen Reactions   . Amoxicillin Hives   . Demerol Nausea And Vomiting   . Environmental Allergies    . Erythromycin      ABDOMINAL PAIN   . Gluten Meal      BLOATING, SNEEZING, WATERY EYES, NEUROPATHY FLARE   . Sulfa Drugs Hives     Current Outpatient Prescriptions on File Prior to Visit   Medication Sig Dispense Refill   . cyclobenzaprine (FLEXERIL) 5 MG tablet Take 1 tablet (5 mg total) by mouth 3 times daily as needed for Muscle spasms  30 tablet  2   . HYDROcodone-acetaminophen (NORCO) 5-325 MG per tablet Take 1 tablet by mouth every 6 hours as needed for Pain    MDD 4 tablets  30 tablet  0   . dexlansoprazole (DEXILANT) 60 MG capsule TAKE 1 CAPSULE DAILY EVERY MORNING BEFORE BREAKFAST.  90 capsule  3   . valACYclovir (VALTREX) 1 GM tablet TAKE 1 TABLET DAILY  90 tablet  1   . Hyoscyamine Sulfate 0.125 MG TBDP Take 1 tablet by mouth Q4-6H PRN  30 each  1   . progesterone (PROMETRIUM) 200 MG capsule Take 200 mg by mouth at bedtime       . cetirizine (ZYRTEC) 10 MG tablet Take 10 mg by mouth daily       . metroNIDAZOLE (METROGEL) 1 % gel Apply topically 2 times daily  55 g  5   . fluticasone (FLOVENT HFA) 110 MCG/ACT inhaler INHALE 2 PUFFS TWICE DAILY  1 Inhaler  5   . mometasone (NASONEX) 50 MCG/ACT nasal spray 2 sprays by Each Nare route daily  17 g  1   . 7-KETO DHEA Take 100 mg by mouth daily       . Probiotic Product (PROBIOTIC FORMULA PO) Take 1  capsule by mouth daily       . DIGESTIVE ENZYMES PO Take 1 capsule by mouth daily       . zolpidem (AMBIEN) 10 MG tablet Take 1/2 to 1 tablet at bedtime prn sleep  30 tablet  2   . SUMAtriptan (IMITREX) 100 MG tablet TAKE 1 TABLET FOR MIGRAINE RELIEF. MAY REPEAT 2 HOURS LATER. MAXIMUM 200MG /DAY.  9 tablet  5   . butalbital-acetaminophen-caffeine (FIORICET, ESGIC) 50-325-40 MG per tablet 1 OR 2 TABLETS EVERY 4 TO 6 HOURS AS NEEDED FOR HEADACHE.  30 tablet  1   . B Complex Vitamins (VITAMIN B COMPLEX) capsule Take 2 capsules by mouth every 12 hours   Neuropathy multivitamin        . Cholecalciferol (VITAMIN D) 1000 UNIT tablet TAKE 1 TABLET DAILY.    0   . Magnesium 200 MG TABS TAKE 1 TABLET DAILY AS DIRECTED.    0     No current facility-administered medications on file prior to visit.     Patient Active Problem List   Diagnosis Code   . Depression 311   . Internal Hemorrhoids 455.0   . Esophageal reflux 530.81   . Acne 706.1   . Anxiety 300.00   . 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 Joint Pain, Localized In The Knee 719.46   . Allergic Rhinitis 477.9   . Irritable bowel syndrome 564.1   . Gastroparesis  536.3   . Migraine Headache 346.90   . Tingling (Paresthesia) 782.0   . Epigastric pain 789.06   . 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 Family history of muscular dystrophy V17.2   . Elevated CK 790.5   . Fecal incontinence 787.60   . Rosacea 695.3   . Family history of melanoma V16.8   . Dermatitis 692.9   . Fibromyalgia 729.1     History   Substance Use Topics   . Smoking status: Former Smoker     Types: Cigarettes     Quit date: 10/07/1980   . Smokeless tobacco: Never Used      Comment: rare cigarettes   . Alcohol Use: 3.5 oz/week     7 drink(s) per week         Objective:     Recent Lab Results:  Lab Results   Component Value Date    NA 133 11/20/2009    K 3.7 11/20/2009    CL 97 11/20/2009    CO2 29* 11/20/2009    UN 8 11/20/2009    CREAT 0.72 11/20/2009    VID25 62 04/08/2010    WBC 8.1 12/12/2009    HGB 13.7 12/12/2009    HCT 42 12/12/2009    PLT 326 12/12/2009    TSH 1.70 04/08/2010     Physical Exam:  Filed Vitals:    10/30/12 0742   BP: 112/80   Pulse: 64   Height: 1.65 m (5' 4.96")   Weight: 62.596 kg (138 lb)      Estimated body mass index is 22.99 kg/(m^2) as calculated from the following:    Height as of this encounter: 1.65 m (5' 4.96").    Weight as of this encounter: 62.596 kg (138 lb).     The patient is a tired-appearing 57 year old woman who is in no acute distress.  Her lungs are clear.  Cardiac exam shows a regular rhythm.  There are multiple tender trigger points.  There is no swelling or deformity of her joints.  Motor strength is normal.    Assessment / Plan:     The patient is a 57 year old woman who has a history of chronic paresthesias secondary to a small fiber sensory neuropathy and a chronically elevated CK level.  She now presents with a three-week history of joint pain, fatigue, burning sensation of her skin and mild cognitive symptoms.  There is no obvious unifying diagnosis.  She has not had any evidence of systemic inflammation or of an autoimmune disorder in the past but that  needs to be considered.  She has an elevated CK level of unknown etiology.  She does have a family history of facioscapulohumeral muscular dystrophy but she has seen neurology in the past who felt that she did not have this disorder.  Fibromyalgia may explain some of her symptoms.  --Laboratory studies will be done including a CBC with differential, sedimentation rate, CRP, rheumatoid factor, ANA, Epstein-Barr viral titers and Lyme titer.  --Rheumatology referral.  --She will go out on short-term disability for the next 6-8 weeks.  --She will call after her blood studies are back.  Otherwise, she is to return in 6 weeks.           Orders Placed This Encounter   Procedures   . AMB REFERRAL TO RHEUMATOLOGY

## 2012-11-16 ENCOUNTER — Telehealth: Payer: Self-pay | Admitting: Primary Care

## 2012-11-16 NOTE — Telephone Encounter (Signed)
No insurance referral needed.

## 2012-11-16 NOTE — Telephone Encounter (Signed)
Patient would like a referral generated to [facility].  Harriett Rush, MD  Crista Luria  [phone]  No scheduled appointment  Possible lyme disease  [facility]    Please advise [phone]

## 2012-11-20 NOTE — Telephone Encounter (Signed)
Patient was told she does need a referral before she can schedule an appointment  Patient would also like to know the status of short term disability ppwk  Please advise  [phone]

## 2012-11-21 NOTE — Telephone Encounter (Signed)
No insurance referral is needed, [employer] just sent paperwork

## 2012-11-22 NOTE — Telephone Encounter (Signed)
Please call [name] and let her know that we just got the form yesterday and it is ready to send out and that her office note was faxed to them last week.

## 2012-11-22 NOTE — Telephone Encounter (Addendum)
Pt notified, form faxed

## 2012-11-22 NOTE — Telephone Encounter (Signed)
Patient is upset that no one has gotten back to her regarding the completion of short term disability paperwork.  Patient states [employer] sent in requested information two weeks ago and [employer] has not heard back from [employer].  Also, patient states she is without income and she is becoming concerned that without completed paperwork income will stop.    Please call patient [phone]

## 2012-12-12 ENCOUNTER — Telehealth: Payer: Self-pay | Admitting: Primary Care

## 2012-12-12 MED ORDER — AZITHROMYCIN 250 MG PO TABS *I*
ORAL_TABLET | ORAL | Status: DC
Start: 2012-12-12 — End: 2012-12-26

## 2012-12-12 NOTE — Telephone Encounter (Signed)
Patient complains of sinus infection for about week- severE sinus pain, brown drainage with an odor.  Patient requesting a script for antibiotic  Please send script to [facility]   She is aware JA is not in the office this afternoon  [phone]

## 2012-12-12 NOTE — Telephone Encounter (Signed)
Let patient know that I sent in a script for azithromycin to her pharmacy.  She should let me know if no better

## 2012-12-13 ENCOUNTER — Other Ambulatory Visit: Payer: Self-pay | Admitting: Primary Care

## 2012-12-13 MED ORDER — HYDROCODONE-ACETAMINOPHEN 5-325 MG PO TABS *I*
1.0000 | ORAL_TABLET | Freq: Four times a day (QID) | ORAL | Status: DC | PRN
Start: 2012-12-13 — End: 2013-01-24

## 2012-12-13 NOTE — Telephone Encounter (Signed)
Pt. Made aware and to call if sx worsen or persist after Abx therapy.

## 2012-12-14 NOTE — Telephone Encounter (Signed)
Pt picked up script and showed ID

## 2012-12-14 NOTE — Telephone Encounter (Signed)
Put up front 

## 2012-12-14 NOTE — Telephone Encounter (Signed)
Patient state JA called her last night.  Per JA will call patient back in 10-15 minutes.    [phone]

## 2012-12-18 ENCOUNTER — Ambulatory Visit: Payer: Self-pay | Admitting: Primary Care

## 2012-12-19 ENCOUNTER — Encounter: Payer: Self-pay | Admitting: Primary Care

## 2012-12-19 ENCOUNTER — Encounter: Payer: Self-pay | Admitting: Gastroenterology

## 2012-12-19 ENCOUNTER — Other Ambulatory Visit: Payer: Self-pay | Admitting: Primary Care

## 2012-12-19 ENCOUNTER — Telehealth: Payer: Self-pay | Admitting: Primary Care

## 2012-12-19 DIAGNOSIS — Z832 Family history of diseases of the blood and blood-forming organs and certain disorders involving the immune mechanism: Secondary | ICD-10-CM | POA: Insufficient documentation

## 2012-12-19 DIAGNOSIS — M2550 Pain in unspecified joint: Secondary | ICD-10-CM | POA: Insufficient documentation

## 2012-12-19 MED ORDER — FLUTICASONE PROPIONATE HFA 110 MCG/ACT IN AERO *I*
INHALATION_SPRAY | RESPIRATORY_TRACT | Status: DC
Start: 2012-12-19 — End: 2013-08-10

## 2012-12-19 NOTE — Telephone Encounter (Signed)
Patient wants to update you about her infectious disease appt she had today  Her ph [phone]

## 2012-12-19 NOTE — Telephone Encounter (Signed)
INFO FAXED

## 2012-12-19 NOTE — Telephone Encounter (Signed)
Fax med list, problem list, last office note and recent labs to Dr. Aggie Moats office with note on cover letter asking that he consider seeing this patient in rheumatology consult.

## 2012-12-21 ENCOUNTER — Telehealth: Payer: Self-pay | Admitting: Primary Care

## 2012-12-21 NOTE — Telephone Encounter (Signed)
Patient called to inform JA that she's faxing the denial letter from [employer] today and to please be on the look out   She is aware JA is not in the office today  FYI

## 2012-12-22 ENCOUNTER — Encounter: Payer: Self-pay | Admitting: Gastroenterology

## 2012-12-22 LAB — HM MAMMOGRAPHY

## 2012-12-26 ENCOUNTER — Ambulatory Visit: Payer: Self-pay | Admitting: Primary Care

## 2012-12-26 ENCOUNTER — Encounter: Payer: Self-pay | Admitting: Primary Care

## 2012-12-26 VITALS — BP 114/78 | HR 64 | Resp 16 | Ht 64.96 in | Wt 137.4 lb

## 2012-12-26 DIAGNOSIS — R0602 Shortness of breath: Secondary | ICD-10-CM

## 2012-12-26 DIAGNOSIS — R5383 Other fatigue: Secondary | ICD-10-CM

## 2012-12-26 DIAGNOSIS — M545 Low back pain, unspecified: Secondary | ICD-10-CM

## 2012-12-26 DIAGNOSIS — M2550 Pain in unspecified joint: Secondary | ICD-10-CM

## 2012-12-26 NOTE — Progress Notes (Signed)
Subjective:      Patient ID: [name] is a 57 y.o. year old female.    Chief Complaint   Patient presents with   . Follow-up     Fatigue and joint pain   . Shortness of Breath   . Numbness       HPI:    The patient is a 57 year old woman who comes in today for follow-up of fatigue and multiple joint pain.  She continues to feel poorly and is out on disability from work.  She complains of extreme fatigue requiring frequent periods of rest throughout the day.  She does not snore and feels that she sleeps fairly well.  However, she does not feel rested when she gets up in the morning.  She occasionally wakes herself up when she falls asleep in a recliner but she has not been told that she has any apnea.  He    She has generalized stiffness in the morning that lasts for 1-1/2 hours.  She stretches every morning for 45 minutes before she gets comfortable.  She has multiple pains in her back, hips, knees and ankles.  She has pain in her wrists and hands, left more than right, without any swelling.    She has chronic tingling of her toes and fingers with occasional shooting pains down her arms and legs that is worse in the right lower leg.  She has an underlying small fiber neuropathy.    She complains of shortness of breath with exertion.  She is unable to walk up more than one flight of stairs.  Although she does not have any wheezing, her breathing is better with a Flovent inhaler.  She has a mild cough.  She denies any associated chest pain.  Her breathing is better when she is at rest.    She recently had an episode of flashing bright lights in her left eye.  She did see an ophthalmologist.  She now has floaters in her left eye.    She did see infectious disease regarding the possibility of chronic Lyme disease.  Western blot Lyme antibody tests were negative and nonreactive.  Babsiella serology is not back at.    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  The nursing  component of her job is more physically taxing which she has difficulty doing because of shortness of breath and fatigue.    Allergy /Medications/ Problem List/ Social History / :  Allergies   Allergen Reactions   . Amoxicillin Hives   . Demerol Nausea And Vomiting   . Environmental Allergies    . Erythromycin      ABDOMINAL PAIN   . Gluten Meal      BLOATING, SNEEZING, WATERY EYES, NEUROPATHY FLARE   . Sulfa Drugs Hives     Current Outpatient Prescriptions on File Prior to Visit   Medication Sig Dispense Refill   . fluticasone (FLOVENT HFA) 110 MCG/ACT inhaler INHALE 2 PUFFS TWICE DAILY  1 Inhaler  5   . HYDROcodone-acetaminophen (NORCO) 5-325 MG per tablet Take 1 tablet by mouth every 6 hours as needed for Pain   MDD 4 tablets  30 tablet  0   . estradiol (CLIMARA) 0.0375 MG/24HR patch Once a week       . gabapentin (NEURONTIN) 300 MG capsule 3 capsules at bedtime  270 capsule  3   . cyclobenzaprine (FLEXERIL) 5 MG tablet Take 1 tablet (5 mg total) by mouth 3  times daily as needed for Muscle spasms  30 tablet  2   . dexlansoprazole (DEXILANT) 60 MG capsule TAKE 1 CAPSULE DAILY EVERY MORNING BEFORE BREAKFAST.  90 capsule  3   . valACYclovir (VALTREX) 1 GM tablet TAKE 1 TABLET DAILY  90 tablet  1   . Hyoscyamine Sulfate 0.125 MG TBDP Take 1 tablet by mouth Q4-6H PRN  30 each  1   . progesterone (PROMETRIUM) 200 MG capsule Take 200 mg by mouth at bedtime       . cetirizine (ZYRTEC) 10 MG tablet Take 10 mg by mouth daily       . mometasone (NASONEX) 50 MCG/ACT nasal spray 2 sprays by Each Nare route daily  17 g  1   . 7-KETO DHEA Take 100 mg by mouth daily       . Probiotic Product (PROBIOTIC FORMULA PO) Take 1 capsule by mouth daily       . DIGESTIVE ENZYMES PO Take 1 capsule by mouth daily       . zolpidem (AMBIEN) 10 MG tablet Take 1/2 to 1 tablet at bedtime prn sleep  30 tablet  2   . SUMAtriptan (IMITREX) 100 MG tablet TAKE 1 TABLET FOR MIGRAINE RELIEF. MAY REPEAT 2 HOURS LATER. MAXIMUM 200MG /DAY.  9 tablet  5   .  butalbital-acetaminophen-caffeine (FIORICET, ESGIC) 50-325-40 MG per tablet 1 OR 2 TABLETS EVERY 4 TO 6 HOURS AS NEEDED FOR HEADACHE.  30 tablet  1   . B Complex Vitamins (VITAMIN B COMPLEX) capsule Take 2 capsules by mouth every 12 hours   Neuropathy multivitamin        . Cholecalciferol (VITAMIN D) 1000 UNIT tablet TAKE 1 TABLET DAILY.    0   . Magnesium 200 MG TABS TAKE 1 TABLET DAILY AS DIRECTED.    0     No current facility-administered medications on file prior to visit.     Patient Active Problem List   Diagnosis Code   . Depression 311   . Internal Hemorrhoids 455.0   . Esophageal reflux 530.81   . Acne 706.1   . 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 Joint Pain, Localized In The Knee 719.46   . Allergic Rhinitis 477.9   . Irritable bowel syndrome 564.1   . Gastroparesis 536.3   . Migraine Headache 346.90   . Tingling (Paresthesia) 782.0   . Epigastric pain 789.06   . 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 Family history of muscular dystrophy V17.2   . Elevated CK 790.5   . Fecal incontinence 787.60   . Rosacea 695.3   . Family history of melanoma V16.8   . Dermatitis 692.9   . Fibromyalgia 729.1   . Multiple joint pain 719.49   . Family history of Wegener's granulomatosis, father V18.3     History   Substance Use Topics   . Smoking status: Former Smoker     Types: Cigarettes     Quit date: 10/07/1980   . Smokeless tobacco: Never Used      Comment: rare cigarettes   . Alcohol Use: 3.5 oz/week     7 drink(s) per week         Objective:       Physical Exam:  Filed Vitals:    12/26/12 0846   BP: 114/78   Pulse: 64   Resp: 16   Height: 1.65 m (5' 4.96")   Weight: 62.324 kg (137 lb 6.4 oz)  Estimated body mass index is 22.89 kg/(m^2) as calculated from the following:    Height as of this encounter: 1.65 m (5' 4.96").    Weight as of this encounter: 62.324 kg (137 lb 6.4 oz).     The patient is a tired-appearing 57 year old woman.  Her neck is supple without thyromegaly or  adenopathy.  Breath sounds are slightly diminished throughout but are clear.  Cardiac exam shows a regular rhythm.  There is mild lumbar tenderness.  Her joints are slightly tender but without an effusion.    Assessment / Plan:     1.  The patient is a 57 year old woman who presents with multiple symptoms including extreme fatigue, multiple joint pains, numbness and tingling of her fingers and toes and exertional shortness of breath.  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  She is unable to keep up the pace at work because of her fatigue and shortness of breath.  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  Return to work date will be delayed until 01/15/13.    2.  Chronic paresthesias secondary to a small fiber sensory neuropathy.  Gabapentin has been helping but may be contributing to her fatigue.    3.  Chronically elevated CK level, low grade myopathy.    4.  Multiple joint pains without any evidence for an inflammatory arthritis.  Work-up for chronic Lyme disease is negative.  Babsiella serology is pending.  She is scheduled to see rheumatology.    5.  Chronic fatigue.  There is no evidence for sleep apnea.  Her fatigue is likely multifactorial and may be in part related to fibromyalgia.  She will decrease gabapentin to 600 mg at bedtime.    6.  Exertional shortness of breath, question secondary to underlying reactive airways.  Pulmonary function tests will be scheduled.    There has been no improvement in her symptoms and her disability will be extended to 01/15/13.    She is to return in 3 weeks.         Orders Placed This Encounter   Procedures   . HM MAMMOGRAPHY   . PULMONARY FUNCTION TEST Complete PFT's (Includes spirometry, SVC, MVV, DLCO, FRC)

## 2012-12-27 ENCOUNTER — Ambulatory Visit: Payer: Self-pay

## 2012-12-27 ENCOUNTER — Telehealth: Payer: Self-pay | Admitting: Primary Care

## 2012-12-27 NOTE — Telephone Encounter (Signed)
Fax back information to [employer] and let patient know that it was done.  Also, let patient know that Dr. Aaron Mose office cannot see her.

## 2012-12-27 NOTE — Telephone Encounter (Signed)
At this time that will not be able to accept her as a new patient

## 2012-12-28 NOTE — Telephone Encounter (Signed)
Info faxed confirmation received

## 2012-12-31 ENCOUNTER — Encounter: Payer: Self-pay | Admitting: Primary Care

## 2012-12-31 DIAGNOSIS — R0602 Shortness of breath: Secondary | ICD-10-CM | POA: Insufficient documentation

## 2012-12-31 DIAGNOSIS — R5383 Other fatigue: Secondary | ICD-10-CM | POA: Insufficient documentation

## 2013-01-01 ENCOUNTER — Encounter: Payer: Self-pay | Admitting: Primary Care

## 2013-01-05 ENCOUNTER — Encounter: Payer: Self-pay | Admitting: Surgery

## 2013-01-05 ENCOUNTER — Telehealth: Payer: Self-pay

## 2013-01-05 NOTE — Telephone Encounter (Signed)
Patient called in, she scheduled an appointment with Chauncey Fischer for 01/12/13. Patient states "[name] sometimes wants me to go do bloodwork before my appointments, can you ask her if she wants me to do that again."    Routed to [employer] [facility] for order/answer

## 2013-01-09 NOTE — Telephone Encounter (Signed)
PC to patient-LM for patient to call back with what labs she wants me to order.

## 2013-01-10 ENCOUNTER — Telehealth: Payer: Self-pay | Admitting: Surgery

## 2013-01-10 NOTE — Telephone Encounter (Signed)
PC to patient.  I have reviewed her labs done over the last 3 months by myself and others.  I would rather wait to see what she needs ordered before I order anything.  We will discuss this at her next visit.    She is convinced that she has Lyme's disease despite the tests all being negative.  I recommend that she see the Lyme's specialist in [place] that another one of my patients sees. Patient is OK with this plan.

## 2013-01-12 ENCOUNTER — Ambulatory Visit: Payer: Self-pay | Admitting: Surgery

## 2013-01-12 ENCOUNTER — Telehealth: Payer: Self-pay | Admitting: Surgery

## 2013-01-12 ENCOUNTER — Encounter: Payer: Self-pay | Admitting: Surgery

## 2013-01-12 VITALS — BP 112/78 | HR 55 | Ht 65.0 in | Wt 136.8 lb

## 2013-01-12 DIAGNOSIS — R5383 Other fatigue: Secondary | ICD-10-CM

## 2013-01-12 DIAGNOSIS — N959 Unspecified menopausal and perimenopausal disorder: Secondary | ICD-10-CM

## 2013-01-12 DIAGNOSIS — M791 Myalgia, unspecified site: Secondary | ICD-10-CM

## 2013-01-12 DIAGNOSIS — R899 Unspecified abnormal finding in specimens from other organs, systems and tissues: Secondary | ICD-10-CM

## 2013-01-12 NOTE — Telephone Encounter (Signed)
PC to patient.  LM that I spoke with [facility] and they will be sending directly to her one of their I-spot kits.  I will then call her once the results are back.

## 2013-01-12 NOTE — Progress Notes (Signed)
Subjective:     Patient ID: [name] is a 57 y.o. female.    HPI LMP 05/2008,  who presents to discuss hormones and fatigue.  Patient is transfer from the [employer] office.  She relates that she had been OOW for 8 weeks due to extreme fatigue, weakness, myalgias and pain.  She has generalized weakness.  She has seen not only her PCP, Dr. Arlyss Queen for this but has also been seen by GI as well as [facility].   She is convinced that she has Lyme's disease.  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  Her Western Blot Lyme's tests were negative.  She would like to have the functional medicine test, I-spot, done as this can pick up Lyme's that the Western blot misses.  She has had an elevated CK for awhile.   She saw a rheumatologist (Dr. Carole Civil) 1 year ago.  If she has Fibromyalgia, the rheumatologist was unwilling to deal with [facility].  She also asks about mesh from pelvic surgery as being a contributor.  I sent this patient to Dr. Garlan Fillers (Urogyn) a few years ago for a consult, and nothing was found.  [name] is unable to exercise except for gentle yoga.    She has been trying different supplements from the [employer] [facility], Penni Bombard.  She is now currently on a thyroid support.  She had been on an adrenal support, and she is now off that.    She sees a chiropractor once per week or so.  Her one leg is generally shorter than the other at the start of a session.  I ran a Cortisol saliva test on her 2-3 year ago.    Patient's medications, allergies, past medical, surgical, social and family histories were reviewed and updated as appropriate.    Review of Systems   Eyes: Positive for visual disturbance.        Sees floaters   Takes 5-HTP; has been on GABA in the past      As above    Objective:   Physical Exam   Nursing note and vitals reviewed.  Constitutional: She is oriented to person, place, and time. She appears well-developed and well-nourished.   Neurological:  She is alert and oriented to person, place, and time.   Psychiatric: She has a normal mood and affect. Her behavior is normal. Thought content normal.             Assessment:      1. Fatigue  2. Abnormal CK  3. Myalgia  4. Menopausal syndrome      Does wear seatbelts  Does eye exam once per year  Dental care q 3 months.  Uses sunscreen when in the sun.  Does have a health care proxy in place.  Last mammogram 11/2012  Last colonoscopy 2011  Last Dexascan 2014  Last HIV 2005        Plan:      1. Discussed adding Cytomet (T3), though controversial, because T3 was 2.5 in 5/14.  I like it to be between 3-4.5.    2. Thyroid labs ordered to include antibodies.  (had slight elevation of TPO a few years ago)-req faxed to [facility] labs.  3. Plan to check into I-spot testing.  Will contact [facility].  If available, will run it.  4. No need to test hormones today.  Continue the Progesterone for now.  5. This was a 40 minute visit and 80% of  the time was spent counseling.  6. RTO prn  7. Last Vitamin D was in the 40's. Has increased her Vitamin D3 drops up.  Her last Vitamin B12 was >2000, and she has decreased her intake of Vitamin B12.

## 2013-01-15 ENCOUNTER — Encounter: Payer: Self-pay | Admitting: Primary Care

## 2013-01-15 ENCOUNTER — Ambulatory Visit: Payer: Self-pay | Admitting: Primary Care

## 2013-01-15 VITALS — BP 108/70 | HR 68 | Ht 64.0 in | Wt 137.0 lb

## 2013-01-15 DIAGNOSIS — M461 Sacroiliitis, not elsewhere classified: Secondary | ICD-10-CM

## 2013-01-15 DIAGNOSIS — N959 Unspecified menopausal and perimenopausal disorder: Secondary | ICD-10-CM

## 2013-01-15 DIAGNOSIS — R5383 Other fatigue: Secondary | ICD-10-CM

## 2013-01-15 LAB — TSH: TSH: 2.09 u[IU]/mL (ref 0.27–4.20)

## 2013-01-15 LAB — T4, FREE: Free T4: 1 ng/dL (ref 0.9–1.7)

## 2013-01-15 MED ORDER — TRAMADOL HCL 50 MG PO TABS *I*
50.0000 mg | ORAL_TABLET | Freq: Three times a day (TID) | ORAL | Status: DC | PRN
Start: 2013-01-15 — End: 2013-02-20

## 2013-01-16 LAB — T3, FREE: T3,Free: 2.4 pg/mL (ref 2.0–4.4)

## 2013-01-17 ENCOUNTER — Telehealth: Payer: Self-pay | Admitting: Surgery

## 2013-01-17 NOTE — Telephone Encounter (Signed)
PC to patient.  Lyme's Doctor is Dr. Kenard Gower      [place], [place]      [phone]  Labs back so far discussed.  Free T3 is low at 2.4.  Await rest of tests and will call patient back again when all the tests are back

## 2013-01-18 LAB — THYROID ANTIBODIES
Thyroglobulin Ab: <20 IU/mL (ref 0–40)
Thyroid Peroxidase Ab: 24 IU/mL (ref 0–33)

## 2013-01-19 LAB — T3, REVERSE: T3, Reverse: 10.3 ng/dL (ref 9.0–27.0)

## 2013-01-24 ENCOUNTER — Encounter: Payer: Self-pay | Admitting: Primary Care

## 2013-01-24 NOTE — Progress Notes (Signed)
Subjective:      Patient ID: [name] is a 57 y.o. year old female.    Chief Complaint   Patient presents with   . Back Pain     left lower  side ,down left hip and front of left leg   . Fatigue       HPI:    The patient is a 57 year old woman who comes in today for follow-up of chronic fatigue.  She also continues to complain of paresthesias and multiple joint pains.  She has been back to work part-time for the last 2 weeks.  Although this has been difficult, she is getting through the days.  Cymbalta has not helped in the past and she questions the effectiveness of Lyrica.    She complains of left-sided low back pain for the last 5 days.  Her pain is mostly over her left sacroiliac joint and radiates down the front of her left thigh but not quite to the left knee.  She has been having more tingling of her left lower leg.    Her breathing is better and she is not as short of breath.    Allergy /Medications/ Problem List/ Social History / :  Allergies   Allergen Reactions   . Amoxicillin Hives   . Demerol Nausea And Vomiting   . Environmental Allergies    . Erythromycin      ABDOMINAL PAIN   . Gluten Meal      BLOATING, SNEEZING, WATERY EYES, NEUROPATHY FLARE   . Sulfa Drugs Hives     Current Outpatient Prescriptions on File Prior to Visit   Medication Sig Dispense Refill   . fluticasone (FLOVENT HFA) 110 MCG/ACT inhaler INHALE 2 PUFFS TWICE DAILY  1 Inhaler  5   . HYDROcodone-acetaminophen (NORCO) 5-325 MG per tablet Take 1 tablet by mouth every 6 hours as needed for Pain   MDD 4 tablets  30 tablet  0   . estradiol (CLIMARA) 0.0375 MG/24HR patch Once a week       . gabapentin (NEURONTIN) 300 MG capsule 3 capsules at bedtime  270 capsule  3   . cyclobenzaprine (FLEXERIL) 5 MG tablet Take 1 tablet (5 mg total) by mouth 3 times daily as needed for Muscle spasms  30 tablet  2   . dexlansoprazole (DEXILANT) 60 MG capsule TAKE 1 CAPSULE DAILY EVERY MORNING BEFORE BREAKFAST.  90 capsule  3   . valACYclovir  (VALTREX) 1 GM tablet TAKE 1 TABLET DAILY  90 tablet  1   . Hyoscyamine Sulfate 0.125 MG TBDP Take 1 tablet by mouth Q4-6H PRN  30 each  1   . progesterone (PROMETRIUM) 200 MG capsule Take 200 mg by mouth at bedtime       . cetirizine (ZYRTEC) 10 MG tablet Take 10 mg by mouth daily       . mometasone (NASONEX) 50 MCG/ACT nasal spray 2 sprays by Each Nare route daily  17 g  1   . 7-KETO DHEA Take 100 mg by mouth daily       . Probiotic Product (PROBIOTIC FORMULA PO) Take 1 capsule by mouth daily       . DIGESTIVE ENZYMES PO Take 1 capsule by mouth daily       . zolpidem (AMBIEN) 10 MG tablet Take 1/2 to 1 tablet at bedtime prn sleep  30 tablet  2   . SUMAtriptan (IMITREX) 100 MG tablet TAKE 1 TABLET FOR MIGRAINE RELIEF. MAY REPEAT 2 HOURS  LATER. MAXIMUM 200MG /DAY.  9 tablet  5   . butalbital-acetaminophen-caffeine (FIORICET, ESGIC) 50-325-40 MG per tablet 1 OR 2 TABLETS EVERY 4 TO 6 HOURS AS NEEDED FOR HEADACHE.  30 tablet  1   . B Complex Vitamins (VITAMIN B COMPLEX) capsule Take 2 capsules by mouth every 12 hours   Neuropathy multivitamin        . Cholecalciferol (VITAMIN D) 1000 UNIT tablet TAKE 1 TABLET DAILY.    0   . Magnesium 200 MG TABS TAKE 1 TABLET DAILY AS DIRECTED.    0     No current facility-administered medications on file prior to visit.     Patient Active Problem List   Diagnosis Code   . Depression 311   . Internal Hemorrhoids 455.0   . Esophageal reflux 530.81   . Acne 706.1   . 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 Joint Pain, Localized In The Knee 719.46   . Allergic Rhinitis 477.9   . Irritable bowel syndrome 564.1   . Gastroparesis 536.3   . Migraine Headache 346.90   . Tingling (Paresthesia) 782.0   . Epigastric pain 789.06   . 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 Family history of muscular dystrophy V17.2   . Elevated CK 790.5   . Fecal incontinence 787.60   . Rosacea 695.3   . Family history of melanoma V16.8   . Dermatitis 692.9   . Fibromyalgia 729.1    . Multiple joint pain 719.49   . Family history of Wegener's granulomatosis, father V18.3   . Fatigue 780.79   . Short of breath on exertion 786.05     History   Substance Use Topics   . Smoking status: Former Smoker     Types: Cigarettes     Quit date: 10/07/1980   . Smokeless tobacco: Never Used      Comment: rare cigarettes   . Alcohol Use: 3.5 oz/week     7 drink(s) per week         Objective:     Recent Lab Results:  Lab Results   Component Value Date    NA 133 11/20/2009    K 3.7 11/20/2009    CL 97 11/20/2009    CO2 29* 11/20/2009    UN 8 11/20/2009    CREAT 0.72 11/20/2009    VID25 62 04/08/2010    WBC 8.1 12/12/2009    HGB 13.7 12/12/2009    HCT 42 12/12/2009    PLT 326 12/12/2009    TSH 2.09 01/15/2013     Physical Exam:  Filed Vitals:    01/15/13 1442   BP: 108/70   Pulse: 68   Height: 1.626 m (5\' 4" )   Weight: 62.143 kg (137 lb)      Estimated body mass index is 23.5 kg/(m^2) as calculated from the following:    Height as of this encounter: 1.626 m (5\' 4" ).    Weight as of this encounter: 62.143 kg (137 lb).     The patient is a pleasant 57 year old woman who is comfortable at rest.  Her lungs are clear.  Cardiac exam shows a regular rhythm.  There is mild tenderness over her left sacroiliac joint.  Straight leg raising test is positive bilaterally, left more than right.  Motor strength in the lower extremity is normal.    Assessment / Plan:     1.  Chronic fatigue is unchanged.  Her work-up to date has been negative.  Although she is having difficulty with her energy level, she is back to work part-time.  Her restriction of working part time will likely need to be extended.    2.  Left-sided low back pain secondary to left sacroiliitis and left neural foraminal narrowing at L2-L3 seen on MRI scan.  She will be treated symptomatically.  She will use tramadol 50 mg 3 times a day as needed.  Physical therapy will be considered if she is no better.    She is to return in 4 weeks.

## 2013-01-25 ENCOUNTER — Telehealth: Payer: Self-pay | Admitting: Primary Care

## 2013-01-25 NOTE — Telephone Encounter (Signed)
Hold for JA

## 2013-01-25 NOTE — Telephone Encounter (Signed)
Letter needs to be dated 01/30/2013. Letter needs to be ready to be picked up Tuesday morning.    Patient states she has 28-32 hours and to be re-evaluated in four weeks.    Please advise [phone]

## 2013-01-26 NOTE — Telephone Encounter (Signed)
Please fax to [employer] [phone]

## 2013-01-30 NOTE — Telephone Encounter (Signed)
Letter faxed and confirmed 

## 2013-01-30 NOTE — Telephone Encounter (Signed)
Please fax back letter ASAP.

## 2013-02-02 ENCOUNTER — Other Ambulatory Visit: Payer: Self-pay | Admitting: Primary Care

## 2013-02-02 DIAGNOSIS — G43909 Migraine, unspecified, not intractable, without status migrainosus: Secondary | ICD-10-CM

## 2013-02-02 NOTE — Telephone Encounter (Signed)
Patient called stating she is out of medication and would like script sent to pharmacy today.

## 2013-02-02 NOTE — Telephone Encounter (Signed)
What medications?  Nothing listed or set-up.  G.

## 2013-02-14 ENCOUNTER — Ambulatory Visit: Payer: Self-pay | Admitting: Primary Care

## 2013-02-20 ENCOUNTER — Ambulatory Visit: Payer: Self-pay | Admitting: Primary Care

## 2013-02-20 ENCOUNTER — Encounter: Payer: Self-pay | Admitting: Primary Care

## 2013-02-20 VITALS — BP 100/70 | HR 60 | Ht 64.17 in | Wt 136.0 lb

## 2013-02-20 DIAGNOSIS — R209 Unspecified disturbances of skin sensation: Secondary | ICD-10-CM

## 2013-02-20 DIAGNOSIS — Z0000 Encounter for general adult medical examination without abnormal findings: Secondary | ICD-10-CM

## 2013-02-20 DIAGNOSIS — M549 Dorsalgia, unspecified: Secondary | ICD-10-CM

## 2013-02-20 MED ORDER — SERTRALINE HCL 50 MG PO TABS *I*
50.0000 mg | ORAL_TABLET | Freq: Every day | ORAL | Status: DC
Start: 2013-02-20 — End: 2013-03-20

## 2013-02-20 MED ORDER — NON-SYSTEM MEDICATION *A*
Status: DC
Start: 2013-02-20 — End: 2013-10-19

## 2013-02-24 ENCOUNTER — Encounter: Payer: Self-pay | Admitting: Primary Care

## 2013-02-24 NOTE — Progress Notes (Signed)
Subjective:      Patient ID: [name] is a 57 y.o. year old female.    Chief Complaint   Patient presents with   . Disability Evaluation     Pain, fatigue and chronic paresthesias       HPI:    The patient is a 57 year-old woman who comes in today for follow-up of back pain, fatigue and chronic paresthesias.    She is working 28-32 hours a week.  She feels mildly better and there has been a slight decrease in her pain level.  She has pain mostly in her low back, legs and hands.  She is seeing a [facility].  She is trying to walk on a more regular basis and there has been a mild improvement in her endurance.  She is on gabapentin 600 mg at bedtime which is helping although she still feels groggy in the morning and is unable to go to a higher dose.    She continues to have burning, numbness and tingling of her hands and feet which are unchanged.  There is a numb patch over her right lower leg.    She is interested in possibly going back on an antidepressant.    Allergy /Medications/ Problem List/ Social History / :  Allergies   Allergen Reactions   . Amoxicillin Hives   . Demerol Nausea And Vomiting   . Environmental Allergies    . Erythromycin      ABDOMINAL PAIN   . Gluten Meal      BLOATING, SNEEZING, WATERY EYES, NEUROPATHY FLARE   . Sulfa Drugs Hives     Current Outpatient Prescriptions on File Prior to Visit   Medication Sig Dispense Refill   . SUMAtriptan (IMITREX) 100 MG tablet TAKE ONE TABLET BY MOUTH FOR MIGRAINE RELIEF. MAY REPEAT TWO HOURS LATER. MAXIMUM DAILY DOSE 200MG  PER DAY  9 tablet  5   . fluticasone (FLOVENT HFA) 110 MCG/ACT inhaler INHALE 2 PUFFS TWICE DAILY  1 Inhaler  5   . estradiol (CLIMARA) 0.0375 MG/24HR patch Once a week       . gabapentin (NEURONTIN) 300 MG capsule 3 capsules at bedtime  270 capsule  3   . cyclobenzaprine (FLEXERIL) 5 MG tablet Take 1 tablet (5 mg total) by mouth 3 times daily as needed for Muscle spasms  30 tablet  2   . dexlansoprazole (DEXILANT) 60 MG capsule  TAKE 1 CAPSULE DAILY EVERY MORNING BEFORE BREAKFAST.  90 capsule  3   . valACYclovir (VALTREX) 1 GM tablet TAKE 1 TABLET DAILY  90 tablet  1   . Hyoscyamine Sulfate 0.125 MG TBDP Take 1 tablet by mouth Q4-6H PRN  30 each  1   . progesterone (PROMETRIUM) 200 MG capsule Take 200 mg by mouth at bedtime       . cetirizine (ZYRTEC) 10 MG tablet Take 10 mg by mouth daily       . mometasone (NASONEX) 50 MCG/ACT nasal spray 2 sprays by Each Nare route daily  17 g  1   . 7-KETO DHEA Take 100 mg by mouth daily       . Probiotic Product (PROBIOTIC FORMULA PO) Take 1 capsule by mouth daily       . DIGESTIVE ENZYMES PO Take 1 capsule by mouth daily       . zolpidem (AMBIEN) 10 MG tablet Take 1/2 to 1 tablet at bedtime prn sleep  30 tablet  2   . butalbital-acetaminophen-caffeine (FIORICET, ESGIC)  50-325-40 MG per tablet 1 OR 2 TABLETS EVERY 4 TO 6 HOURS AS NEEDED FOR HEADACHE.  30 tablet  1   . B Complex Vitamins (VITAMIN B COMPLEX) capsule Take 2 capsules by mouth every 12 hours   Neuropathy multivitamin        . Cholecalciferol (VITAMIN D) 1000 UNIT tablet TAKE 1 TABLET DAILY.    0   . Magnesium 200 MG TABS TAKE 1 TABLET DAILY AS DIRECTED.    0     No current facility-administered medications on file prior to visit.     Patient Active Problem List   Diagnosis Code   . Depression 311   . Internal Hemorrhoids 455.0   . Esophageal reflux 530.81   . Acne 706.1   . 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 Joint Pain, Localized In The Knee 719.46   . Allergic Rhinitis 477.9   . Irritable bowel syndrome 564.1   . Gastroparesis 536.3   . Migraine Headache 346.90   . Tingling (Paresthesia) 782.0   . Epigastric pain 789.06   . 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 Family history of muscular dystrophy V17.2   . Elevated CK 790.5   . Fecal incontinence 787.60   . Rosacea 695.3   . Family history of melanoma V16.8   . Dermatitis 692.9   . Fibromyalgia 729.1   . Multiple joint pain 719.49   . Family history  of Wegener's granulomatosis, father V18.3   . Fatigue 780.79   . Short of breath on exertion 786.05     History   Substance Use Topics   . Smoking status: Former Smoker     Types: Cigarettes     Quit date: 10/07/1980   . Smokeless tobacco: Never Used      Comment: rare cigarettes   . Alcohol Use: 3.5 oz/week     7 drink(s) per week         Objective:     Recent Lab Results:  Lab Results   Component Value Date    NA 133 11/20/2009    K 3.7 11/20/2009    CL 97 11/20/2009    CO2 29* 11/20/2009    UN 8 11/20/2009    CREAT 0.72 11/20/2009    VID25 62 04/08/2010    WBC 8.1 12/12/2009    HGB 13.7 12/12/2009    HCT 42 12/12/2009    PLT 326 12/12/2009    TSH 2.09 01/15/2013     Physical Exam:  Filed Vitals:    02/20/13 0742   BP: 100/70   Pulse: 60   Height: 1.63 m (5' 4.17")   Weight: 61.689 kg (136 lb)      Estimated body mass index is 23.22 kg/(m^2) as calculated from the following:    Height as of this encounter: 1.63 m (5' 4.17").    Weight as of this encounter: 61.689 kg (136 lb).     The patient is a pleasant 57 year old woman who is in no acute distress.  Her lungs are clear.  Cardiac exam shows a regular rhythm.  There is mild lumbar tenderness.    Assessment / Plan:     1.  Low back pain has mildly improved.  She continues to have pain in her legs and hands.  --Physical therapy to her low back and right leg.    2.  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  --She will continue gabapentin 600 mg at bedtime.  3.  Chronic fatigue is slightly better.  She does feel mildly depressed due to her multiple medical problems.    --She was given a prescription for sertraline 50 mg to start 1/2 tablet a day and then increasing to one tablet a day in 2 weeks.    She has an appointment at work to discuss her hours.    Flu vaccine was given today.    She is to return in one month.         Orders Placed This Encounter   Procedures   . Flu Vaccine Quadrivalent greater than or equal to 3yo preservative free IM (AMBULATORY USE  ONLY)

## 2013-02-27 ENCOUNTER — Other Ambulatory Visit: Payer: Self-pay | Admitting: Primary Care

## 2013-02-27 MED ORDER — VALACYCLOVIR HCL 1000 MG PO TABS *I*
ORAL_TABLET | ORAL | Status: DC
Start: 2013-02-27 — End: 2013-08-10

## 2013-02-28 ENCOUNTER — Ambulatory Visit: Payer: Self-pay | Admitting: Allergy/Immunology/Rheumatology

## 2013-02-28 ENCOUNTER — Encounter: Payer: Self-pay | Admitting: Allergy/Immunology/Rheumatology

## 2013-02-28 VITALS — BP 118/67 | HR 68 | Temp 97.0°F | Ht 64.5 in | Wt 135.1 lb

## 2013-02-28 DIAGNOSIS — G608 Other hereditary and idiopathic neuropathies: Secondary | ICD-10-CM

## 2013-02-28 DIAGNOSIS — M2550 Pain in unspecified joint: Secondary | ICD-10-CM

## 2013-02-28 DIAGNOSIS — R748 Abnormal levels of other serum enzymes: Secondary | ICD-10-CM

## 2013-02-28 LAB — CBC AND DIFFERENTIAL
Baso # K/uL: 0.1 10*3/uL (ref 0.0–0.1)
Basophil %: 1 % (ref 0.1–1.2)
Eos # K/uL: 0.3 10*3/uL (ref 0.0–0.4)
Eosinophil %: 4.4 % (ref 0.7–5.8)
Hematocrit: 42 % (ref 34–45)
Hemoglobin: 14.3 g/dL (ref 11.2–15.7)
Lymph # K/uL: 2 10*3/uL (ref 1.2–3.7)
Lymphocyte %: 28.1 % (ref 19.3–51.7)
MCV: 96 fL — ABNORMAL HIGH (ref 79–95)
Mono # K/uL: 0.4 10*3/uL (ref 0.2–0.9)
Monocyte %: 5.5 % (ref 4.7–12.5)
Neut # K/uL: 4.4 10*3/uL (ref 1.6–6.1)
Platelets: 273 10*3/uL (ref 160–370)
RBC: 4.4 MIL/uL (ref 3.9–5.2)
RDW: 13.3 % (ref 11.7–14.4)
Seg Neut %: 61 % (ref 34.0–71.1)
WBC: 7.2 10*3/uL (ref 4.0–10.0)

## 2013-02-28 LAB — C4 COMPLEMENT: C4: 22 mg/dL (ref 10–40)

## 2013-02-28 LAB — COMPREHENSIVE METABOLIC PANEL
ALT: 21 U/L (ref 0–35)
AST: 29 U/L (ref 0–35)
Albumin: 4.6 g/dL (ref 3.5–5.2)
Alk Phos: 56 U/L (ref 35–105)
Anion Gap: 15 (ref 7–16)
Bilirubin,Total: 0.4 mg/dL (ref 0.0–1.2)
CO2: 23 mmol/L (ref 20–28)
Calcium: 9.7 mg/dL (ref 8.6–10.2)
Chloride: 100 mmol/L (ref 96–108)
Creatinine: 0.64 mg/dL (ref 0.51–0.95)
GFR,Black: 115 *
GFR,Caucasian: 100 *
Glucose: 91 mg/dL (ref 60–99)
Lab: 9 mg/dL (ref 6–20)
Potassium: 4.1 mmol/L (ref 3.3–5.1)
Sodium: 138 mmol/L (ref 133–145)
Total Protein: 7.1 g/dL (ref 6.3–7.7)

## 2013-02-28 LAB — URINALYSIS WITH REFLEX TO MICROSCOPIC
Blood,UA: NEGATIVE
Ketones, UA: NEGATIVE
Leuk Esterase,UA: NEGATIVE
Nitrite,UA: NEGATIVE
Protein,UA: NEGATIVE mg/dL
Specific Gravity,UA: 1.009 (ref 1.002–1.030)
pH,UA: 7 (ref 5.0–8.0)

## 2013-02-28 LAB — IGG: IgG: 1053 mg/dL (ref 700–1600)

## 2013-02-28 LAB — C3 COMPLEMENT: C3: 98 mg/dL (ref 90–180)

## 2013-02-28 LAB — IGM: IgM: 76 mg/dL (ref 40–230)

## 2013-02-28 LAB — PROTEIN, URINE: Protein,UR: <6 mg/dL (ref 0–11)

## 2013-02-28 LAB — CRP: CRP: 1 mg/L (ref 0–10)

## 2013-02-28 LAB — CREATININE, URINE: Creatinine,UR: 33 mg/dL (ref 20–300)

## 2013-02-28 LAB — SEDIMENTATION RATE, AUTOMATED: Sedimentation Rate: 6 mm/hr (ref 0–30)

## 2013-02-28 LAB — IGA: IgA: 218 mg/dL (ref 70–400)

## 2013-02-28 NOTE — Progress Notes (Addendum)
Rheumatology New Patient Consult Note    PRIMARY CARE PHYSICIAN:  Arlyss Queen, MD  REFERRING PHYSICIAN:  Arlyss Queen, MD    CHIEF COMPLAINT:  Muscle/joint/nerve pain with fatigue    HPI:   [name] is a 57 y.o. year old Caucasian female with some family h/o autoimmune d/o and prior diagnosis of small fiber neuropathy via EMG in 2012 who is referred for evaluation of weakness, fatigue and diffuse joint aches greater in right hand and right knee. Patient reports acute onset muscle weakness in 2011 that developed into paresthesias of right arm into hands and toes and was seen by neurology who diagnosed small fiber neuropathy on unknown etiology. She reports "flares" of burning/throbbing pain all over since this time that always follow the same pattern; Right ankle with burning pain then shooting pain down right arm then joint pain in hands/fingers/wrist/feet/toes/ankle/right knee/back. Reports associated fatigue, decrease concentration, blurred vision, hives on face and right arm. Flares triggered by lack of sleep, chemicals, excessive activity. Able to complete yoga and light walking but has never been back to baseline. Reports that her CK is always elevated but other labs have been WNL. Has been tested negative for Sjogren's in past. Has h/o dry eyes associated with Fuch's Dystrophy.    Admits to migraine, photophobia, blurred vision with floaters, receding gum line, anxiety, depression, and difficulty falling asleep always feeling tired in morning and taking 600 mg gabapentin at night.     PAST MEDICAL HISTORY:  Past Medical History   Diagnosis Date   . Back pain      Resolved   . Migraine    . GERD (gastroesophageal reflux disease)    . Depression    . Neuropathy    . Gout    . Acne    . Complication of anesthesia      Novacaine Dizzy   . Herpes      Genital   . Urticaria    . Actinic keratosis    . Varicella    . Allergy history unknown    . STD (sexually transmitted disease)      Gential Herpes   .  Dermatitis 02/09/2012   . Recurrent sinus infections    . Diverticulosis    . IBS (irritable bowel syndrome)    . Anemia    . Lumbar stenosis      Past Surgical History   Procedure Laterality Date   . Rectocele repair  2010   . Incontinence surgery  2010   . Sphincteroplasty  2010     ABSCESS AFTER SURGERY   . Endometrial ablation  2009   . Nasal polyp surgery  2003   . Cesarean section, classic  1985       CURRENT MEDICATIONS:   Outpatient Encounter Prescriptions as of 02/28/2013   Medication Sig Dispense Refill   . valACYclovir (VALTREX) 1 GM tablet TAKE 1 TABLET DAILY  90 tablet  3   . Non-System Medication Physical therapy to back and right knee.  Evaluate and treat.  Dx: back pain and right knee pain  8 each  1   . SUMAtriptan (IMITREX) 100 MG tablet TAKE ONE TABLET BY MOUTH FOR MIGRAINE RELIEF. MAY REPEAT TWO HOURS LATER. MAXIMUM DAILY DOSE 200MG  PER DAY  9 tablet  5   . fluticasone (FLOVENT HFA) 110 MCG/ACT inhaler INHALE 2 PUFFS TWICE DAILY  1 Inhaler  5   . estradiol (CLIMARA) 0.0375 MG/24HR patch Once a week       .  gabapentin (NEURONTIN) 300 MG capsule 3 capsules at bedtime  270 capsule  3   . cyclobenzaprine (FLEXERIL) 5 MG tablet Take 1 tablet (5 mg total) by mouth 3 times daily as needed for Muscle spasms  30 tablet  2   . dexlansoprazole (DEXILANT) 60 MG capsule TAKE 1 CAPSULE DAILY EVERY MORNING BEFORE BREAKFAST.  90 capsule  3   . Hyoscyamine Sulfate 0.125 MG TBDP Take 1 tablet by mouth Q4-6H PRN  30 each  1   . progesterone (PROMETRIUM) 200 MG capsule Take 200 mg by mouth at bedtime       . cetirizine (ZYRTEC) 10 MG tablet Take 10 mg by mouth daily       . mometasone (NASONEX) 50 MCG/ACT nasal spray 2 sprays by Each Nare route daily  17 g  1   . 7-KETO DHEA Take 50 mg by mouth daily          . Probiotic Product (PROBIOTIC FORMULA PO) Take 1 capsule by mouth daily       . DIGESTIVE ENZYMES PO Take 1 capsule by mouth daily       . butalbital-acetaminophen-caffeine (FIORICET, ESGIC) 50-325-40 MG per  tablet 1 OR 2 TABLETS EVERY 4 TO 6 HOURS AS NEEDED FOR HEADACHE.  30 tablet  1   . B Complex Vitamins (VITAMIN B COMPLEX) capsule Take 2 capsules by mouth every 12 hours   Neuropathy multivitamin        . Cholecalciferol (VITAMIN D) 1000 UNIT tablet TAKE 1 TABLET DAILY.    0   . Magnesium 200 MG TABS TAKE 1 TABLET DAILY AS DIRECTED.    0   . sertraline (ZOLOFT) 50 MG tablet Take 1 tablet (50 mg total) by mouth daily  30 tablet  5   . zolpidem (AMBIEN) 10 MG tablet Take 1/2 to 1 tablet at bedtime prn sleep  30 tablet  2     No facility-administered encounter medications on file as of 02/28/2013.         ALLERGIES: Amoxicillin; Demerol; Environmental allergies; Erythromycin; Gluten meal; and Sulfa drugs    FAMILY HISTORY:   Family History   Problem Relation Age of Onset   . Other Other      Fascioscapulohumeral Progressive Muscular Dystrophy   . Other Other      Wegener's Granulomatosis   . Other Father      numbness and pain in limbs   . Heart disease Father    . Cancer Father      Basal cell, MDS   . Kidney disease Father    . Stroke Father    . Other Sister      numbness and pain in limbs   . High blood pressure Sister    . Thyroid disease Sister    . Cancer Mother      Melanoma   . Arthritis Mother    . Lupus Other    . Diabetes Other    . Eczema Other    . Other Other      asthma   . Diabetes Paternal Aunt    . Diabetes Paternal Uncle    . Rheum arthritis Paternal Grandmother        SOCIAL HISTORY:  History     Social History   . Marital Status: Married     Spouse Name: N/A     Number of Children: N/A   . Years of Education: N/A     Occupational History   .  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     Social History Main Topics   . Smoking status: Former Smoker     Types: Cigarettes     Quit date: 10/07/1980   . Smokeless tobacco: Never Used      Comment: rare cigarettes   . Alcohol Use: 3.0 oz/week     5 Glasses of wine per week   . Drug Use: No   . Sexual Activity: Yes     Partners: Male     Social History Narrative     Nurse at [employer]; diabetes education. Married. Daughter. No TOB. Wine with dinner 5 nights per week. Exercise with yoga and light walking. Sleeps 6-8 hours but never feel rested.       ROS :    Constitutional: Excessive fatigue   Musculoskeletal: Joint pain, Muscle pain or stiffness   Skin: small diffuse bumps on back of arms, legs, and abdomen. seen dermatology and patient unsure of diagnosis ; intermittent hives on right arm   Eyes: Very dry eyes   Ears/Nose/Mouth/Throat: Ringing in the ears, denies dry mouth   Respiratory: shortness of breath with "flare" this summer   Cardiac: intermittent heart palpitations   Gastrointestinal: Nausea, Diarrhea, Constipation; eliminated dairy and gluten   Genitourinary: denies change in urination   Endocrine: denies temperature intolerance   Allergy/Immunologic: no new allergies   Hematologic: no unusual bleeding or bruising   Neurologic: Headaches, Numbness, diffuse weakness   Psychiatric: Depression, Anxiety, Difficulty falling asleep, Nonrestorative sleep, wakes up groggy    PHYSICAL EXAMINATION:  BP 118/67  Pulse 68  Temp(Src) 36.1 C (97 F) (Temporal)  Ht 1.638 m (5' 4.5")  Wt 61.281 kg (135 lb 1.6 oz)  BMI 22.84 kg/m2  Body mass index is 22.84 kg/(m^2).    Pain    02/28/13 1026   PainSc:   4   PainLoc: Generalized       General: Well-appearing, alert, and in no acute distress    Psych: Normal affect   Skin: pinpoint rash on trunk across epigastric region   Eye: no conjunctival erythema, no eyelid or periorbial edema, pupils equal and round   ENT: moist mucous membranes, no oral lesions, oropharynx clear, normal salivary pooling, ears normal in size, shape, and position, normal dentition   Neck: supple, trachea midline, no thyromegaly    Lymph:no cervical adenopathy, no supraclavicular adenopathy, no axillary adenopathy    Lungs:breathing comfortably on room air, lungs clear without wheezes, rhonchi, or rales    ZO:XWRUEAV rate and rhythm. No  murmurs, rubs, or gallops. Normal S1 and S2.    WU:JWJX, non-tender, normoactive bowel sounds    Extremity: equal pulses, no cyanosis, no edema   Neurologic: Alert and oriented, mental status normal , Sensation intact to light touch, Strength symmetric and normal for age, Reflexes normal, Normal gait   Musculoskeletal:         Complete exam of the bilateral upper extremities demonstrated:     No joint tenderness   No joint warmth or erythema   No joint swelling or synoviits   No joint deformity   Normal active and passive range of motion without pain   No laxity or hypermobility   Normal muscle tone and strength    Complete exam of the bilateral lower extremities demonstrated:    No joint tenderness   No joint warmth or erythema   No joint swelling or synoviits   No joint deformity   Normal active and passive range of motion  without pain   No laxity or hypermobility   Normal muscle tone and strength    Complete exam of the axial musculoskeletal system (including head, neck, spine, ribs, pelvis) demonstrated:    No joint tenderness   No joint warmth or erythema   No joint swelling or synoviits   No joint deformity   Normal active and passive range of motion without pain   No laxity or hypermobility   Normal muscle tone and strength     - no enthesitis   - no tender points    PRIOR STUDIES:   Labs:  10/30/12:  ESR 5  Lyme neg  Prior EBV infection  CK 682  CK-MB 16.2    RADIOLOGY:   EMG on 02/12/11:    This is a slightly abnormal study.     Tibial, peroneal, and ulnar motor nerve conduction studies are normal.   Radial, sural, and medial plantar sensory nerve conduction studies are   normal. Needle EMG examination of proximal and distal limb muscles is   normal. However, there is increased insertional activity and sustained   fibrillation potentials and positive waves in the mid thoracic paraspinal   muscles with normal motor unit morphology.   Overall, there is no clear electrodiagnostic  evidence of a generalized   myopathy. The isolated thoracic paraspinal EMG abnormalities are   nonspecific and of uncertain clinical significance, but in this clinical   setting, are consistent with a very mild underlying myopathy.     There is no evidence of a peripheral neuropathy, and there is no interval   change in the nerve conduction studies compared to the prior examination 4   months ago.        IMPRESSION:  [name] is a 57 yo female with h/o idiopathic small fiber neuropathy presenting with intermittent periods of joint aches and paresthesias in setting of very dry mouth and persistently elevated CK without objective muscle weakness. She has tested negative for Sjogren's in past but we will repeat test and consider lip biopsy in future. Elevated CK could be related to medication side effect. Less likely multiple sclerosis but patient has worsening visual complaints in setting of Fuch's dystrophy.    RECOMMENDATIONS:   1. Joint pain  Anti - SSA/SSB    C reactive protein    C3 complement    C4 complement    CBC and differential    Comprehensive metabolic panel    Creatinine, urine    Cryoglobulin    Immunofixation electrophoresis    IgA    IgG    IGM    Mitochondrial antibodies, M2    Protein, urine    Rheumatoid factor,screen    Sedimentation rate, automated    Protein electrophoresis, serum    Thyroid antibodies    Thyroid peroxidase antibody    Urinalysis with reflex to microscopic    Anti - SSA/SSB    Anti rnp / smith    Anti DS-dna AB    C3 complement    C4 complement    Antinuclear antibody screen    Vitamin D    Anti - SSA/SSB    C reactive protein    C3 complement    C4 complement    CBC and differential    Comprehensive metabolic panel    Creatinine, urine    Cryoglobulin    Immunofixation electrophoresis    IgA    IgG    IGM    Mitochondrial antibodies, M2  Protein, urine    Rheumatoid factor,screen    Sedimentation rate, automated    Protein electrophoresis, serum    Thyroid antibodies     Urinalysis with reflex to microscopic    Anti rnp / smith    Anti DS-dna AB    Antinuclear antibody screen    Vitamin D    CANCELED: Thyroid peroxidase antibody    CANCELED: Anti - SSA/SSB    CANCELED: C3 complement    CANCELED: C4 complement   2. Idiopathic small fiber sensory neuropathy     3. Elevated CK   can consider repeat EMG studies if labwork negative     Follow up via MyChart once lab studies completes so we can discuss results.     Plan discussed with Dr. Wyatt Portela.    Jarold Song, DO  02/28/2013    I saw and evaluated the patient. I agree with the resident's/fellow's findings and plan of care as documented above. Details of my evaluation are as follows: briefly, this is a 57 year old female presenting for evaluation of joint pain and small fiber neuropathy.  Symptoms of neuropathy started with sudden onset about 3 years ago, and diagnosis was confirmed by nerve conduction studies.  She does have Fuch's dystrophy, which is explaining her dry eyes.  She does not have dry mouth, no rashes, no photosensitivity, no Raynaud's, no oral ulcerations.  She describes having "flares" of the neuropathy, where the symptoms are escalating, eventually radiating to 18 units of her joints.  This flares are precipitated by change in activity, and certain perfumes, stress and lack of sleep.  No swelling, no warmth or erythema in her joints.  Apparently she has been investigated for Sjogren's syndrome and no evidence of such disease was found. she also was found to have elevated CK of unclear etiology.  Physical exam is basically unremarkable.  Labs reviewed.  In summary, this is an interesting case of sudden onset of small fiber neuropathy of unclear origin.  She has no oral dryness, and there is a good explanation for her ocular dryness.  However, given her neuropathy, I think it's reasonable to revisit the possibility of Sjogren's syndrome.  We have decided to obtain the entire panel.  She has evidence of autoimmune  dysregulation in the blood work, but the biopsy should be obtained.  We have talked about that.  On the other hand, if her immunological blood work is entirely negative, I think we can safely assume that she does not have Sjogren's syndrome.  The joint pain that she is describing is noninflammatory, associated with these "flares" of neuropathy. I suspect the elevated CK is related to her medications.  She did have nerve conduction studies that did not reveal an underlying myopathy.    We will touch base via Mychart regarding the results of the blood test and decide upon The usefulness of obtaining a biopsy.    Thank for allowing me to participate in the care of this pleasant patient.    This note has been dictated using Animal nutritionist.  Reasonable attempts to correct typing mistakes have been done.  Please excuse any inherent inaccuracy that might have escaped.      Harland German, MD

## 2013-03-01 LAB — RHEUMATOID FACTOR,SCREEN: Rheumatoid Factor: <10 IU/mL

## 2013-03-01 LAB — ANTI DS-DNA AB: dsDNA Ab: <1 IU/mL (ref 0–4)

## 2013-03-01 LAB — ANTI RNP/SMITH
Anti-RNP: <0.2 AI (ref 0.0–0.9)
Anti-Smith: <0.2 AI (ref 0.0–0.9)
SM/RNP AB: <0.2 AI (ref 0.0–0.9)

## 2013-03-01 LAB — IMMUNOFIXATION ELECTROPHORESIS

## 2013-03-01 LAB — ANTINUCLEAR ANTIBODY SCREEN: ANA Screen: NEGATIVE

## 2013-03-01 LAB — ANTI-SSA/SSB
Anti-LA/SS-B: <0.2 AI (ref 0.0–0.9)
Anti-RO/SS-A: 0.2 AI (ref 0.0–0.9)

## 2013-03-02 ENCOUNTER — Encounter: Payer: Self-pay | Admitting: Allergy/Immunology/Rheumatology

## 2013-03-02 LAB — PROTEIN ELECTROPHORESIS, SERUM
A/G Ratio: 1.4 (ref 0.9–1.8)
Albumin: 4.2 g/dL (ref 3.5–5.1)
Alpha 1: 0.2 g/dL (ref 0.2–0.4)
Alpha 2: 0.7 g/dL (ref 0.4–0.9)
Beta: 0.8 g/dL (ref 0.5–1.0)
Gamma: 1.1 g/dL (ref 0.7–1.4)
Interp,PE: NORMAL

## 2013-03-02 LAB — PE ELECT,REVIEW

## 2013-03-02 LAB — IMMUNOFIX,SERUM REVIEW

## 2013-03-02 LAB — MITOCHONDRIAL ANTIBODIES, M2: Mitochondrial Ab: 6 Units (ref 0.0–20.0)

## 2013-03-05 LAB — VITAMIN D
25-OH VIT D2: 10 ng/mL
25-OH VIT D3: 58 ng/mL
25-OH Vit Total: 68 ng/mL — ABNORMAL HIGH (ref 30–60)

## 2013-03-05 LAB — TP/CREATININE RATIO,UR

## 2013-03-05 LAB — CRYOGLOBULIN: Cryoglobulin: NEGATIVE

## 2013-03-05 LAB — THYROID ANTIBODIES
Thyroglobulin Ab: 36 IU/mL (ref 0–40)
Thyroid Peroxidase Ab: 18 IU/mL (ref 0–33)

## 2013-03-15 ENCOUNTER — Other Ambulatory Visit: Payer: Self-pay | Admitting: Primary Care

## 2013-03-15 NOTE — Telephone Encounter (Signed)
Per email, would like to pick up Friday if possible.

## 2013-03-16 MED ORDER — HYDROCODONE-ACETAMINOPHEN 5-325 MG PO TABS *I*
1.0000 | ORAL_TABLET | Freq: Four times a day (QID) | ORAL | Status: DC | PRN
Start: 2013-03-15 — End: 2013-05-01

## 2013-03-16 NOTE — Telephone Encounter (Signed)
Put up front 

## 2013-03-16 NOTE — Telephone Encounter (Signed)
Pt picked up script and showed ID

## 2013-03-20 ENCOUNTER — Ambulatory Visit: Payer: Self-pay | Admitting: Surgery

## 2013-03-20 ENCOUNTER — Encounter: Payer: Self-pay | Admitting: Surgery

## 2013-03-20 VITALS — BP 122/72 | HR 86 | Temp 97.9°F | Ht 65.0 in | Wt 133.2 lb

## 2013-03-20 DIAGNOSIS — N760 Acute vaginitis: Secondary | ICD-10-CM

## 2013-03-20 DIAGNOSIS — R6882 Decreased libido: Secondary | ICD-10-CM

## 2013-03-20 DIAGNOSIS — F32A Depression, unspecified: Secondary | ICD-10-CM

## 2013-03-20 DIAGNOSIS — E349 Endocrine disorder, unspecified: Secondary | ICD-10-CM

## 2013-03-20 LAB — VAGINITIS SCREEN: DNA PROBE: Vaginitis Screen:DNA Probe: POSITIVE

## 2013-03-20 MED ORDER — BUPROPION HCL 100 MG PO TABS *I*
100.0000 mg | ORAL_TABLET | Freq: Two times a day (BID) | ORAL | Status: DC
Start: 2013-03-20 — End: 2013-05-01

## 2013-03-20 NOTE — Progress Notes (Signed)
Vaginal Irritation    S: [name] is  a 57 y.o. who complains of vaginal irritation and painful intercourse.  She has been using Monistat 7.  She has been taking 5 HTP and 2 different supplements from her [facility] NP for thyroid and adrenals.  She continues to have fatigue and depression.  She has decreased libido.  She has not done any of the kit testing that was ordered at her last visit due to finances.        Past medical history:I have reviewed and confirmed the past medical history in the chart.  Medications: reviewed medication list in the chart  Allergies: reviewed allergy section in the chart  Review of Systems: see subjective  Domestic violence:No    O:Vitals: BP 122/72  Pulse 86  Temp(Src) 36.6 C (97.9 F)  Ht 1.651 m (5\' 5" )  Wt 60.419 kg (133 lb 3.2 oz)  BMI 22.17 kg/m2  Gen: Well-appearing, alert, and in no acute distress   Abdomen: abdomen is soft without significant tenderness, masses, organomegaly or guarding  Pelvic: Cervix:  no lesions  External Genitalia:  No lesions  Vagina:  Dry/small amount white discharge  Wet prep-few WBC's, Whiff negative      Assessment:   57 y.o. with vaginal dryness, dyspareunia, depression and decreased libido    Plan:   1.   Orders Placed This Encounter   Procedures   . Vaginitis screen: DNA probe   . DHEA - sulfate   . Estradiol   . Testosterone, free, total   . Progesterone   . TSH   . T3   . T3, free   . T4, free     2. Vaginal DHEA+Estriol reordered at [facility].  3. Reviewed supplements.  Patient to just use the thyroid supplement for now.  4. Discussed weaning off 5HTP and going on buProprion which has been shown to improve libido.  5. Discussed trying Cialis or Viagra.  Patient to consider.  6. This was a 30 minute visit and more than 50% of the time was spent in counseling and coordination of care.  7.  RTO prn

## 2013-03-20 NOTE — Patient Instructions (Signed)
Check out [facility] for supplements

## 2013-03-21 ENCOUNTER — Ambulatory Visit
Admit: 2013-03-21 | Discharge: 2013-03-21 | Disposition: A | Discharge status: Home / Self Care | Payer: Self-pay | Source: Ambulatory Visit | Attending: Surgery | Admitting: Surgery

## 2013-03-21 ENCOUNTER — Telehealth: Payer: Self-pay | Admitting: Surgery

## 2013-03-21 DIAGNOSIS — F32A Depression, unspecified: Secondary | ICD-10-CM

## 2013-03-21 DIAGNOSIS — E349 Endocrine disorder, unspecified: Secondary | ICD-10-CM

## 2013-03-21 DIAGNOSIS — B373 Candidiasis of vulva and vagina: Secondary | ICD-10-CM

## 2013-03-21 LAB — ESTRADIOL: Estradiol: 49 pg/mL

## 2013-03-21 LAB — T3: T3: 100 ng/dL (ref 80–200)

## 2013-03-21 LAB — PROGESTERONE: Progesterone: 17.2 ng/mL

## 2013-03-21 LAB — DHEA-SULFATE: DHEA Sulfate: 128 ug/dL (ref 19–205)

## 2013-03-21 LAB — TSH: TSH: 3.11 u[IU]/mL (ref 0.27–4.20)

## 2013-03-21 LAB — T4, FREE: Free T4: 1 ng/dL (ref 0.9–1.7)

## 2013-03-21 LAB — T3, FREE: T3,Free: 2.8 pg/mL (ref 2.0–4.4)

## 2013-03-21 MED ORDER — FLUCONAZOLE 150 MG PO TABS *I*
150.0000 mg | ORAL_TABLET | Freq: Once | ORAL | Status: AC
Start: 2013-03-21 — End: 2013-03-21

## 2013-03-21 NOTE — Telephone Encounter (Signed)
PC to patient.  Patient has yeast on vaginitis screen.  Message left.  I have prescribed Diflucan x2.

## 2013-03-23 LAB — LC/MS F&T TESTOSTERONE
Testosterone,Free LC MS/MS: 0.2 ng/dL — ABNORMAL LOW (ref 0.3–1.9)
Testosterone,LC-MS/MS: 9.7 ng/dL (ref 8–60)

## 2013-04-04 ENCOUNTER — Encounter: Payer: Self-pay | Admitting: Surgery

## 2013-04-06 ENCOUNTER — Encounter: Payer: Self-pay | Admitting: Obstetrics and Gynecology

## 2013-04-10 ENCOUNTER — Telehealth: Payer: Self-pay | Admitting: Surgery

## 2013-04-10 DIAGNOSIS — E039 Hypothyroidism, unspecified: Secondary | ICD-10-CM

## 2013-04-10 MED ORDER — LIOTHYRONINE SODIUM 5 MCG PO TABS *I*
5.0000 ug | ORAL_TABLET | Freq: Every day | ORAL | Status: DC
Start: 2013-04-10 — End: 2013-06-18

## 2013-04-10 NOTE — Telephone Encounter (Signed)
TC from patient.  Has questions regarding recent lab tests. [name]

## 2013-04-10 NOTE — Telephone Encounter (Signed)
PC to patient.  Labs discussed.  Patient would like to start Cytomel.  Will start with 5 mcg po daily in am.  Recheck labs in 6 weeks.  Will mail lab requistions to patient to be able to have these done in [place] for once she moves.  Decrease Testosterone down to 1/2 pump q 2-3 days (has acne despite labs being low).  Increase 7 Keto DHEA to 150 mg daily and take at bedtime.  Add Selenium 200 mcg po daily.

## 2013-04-12 ENCOUNTER — Telehealth: Payer: Self-pay | Admitting: Surgery

## 2013-04-12 ENCOUNTER — Telehealth: Payer: Self-pay

## 2013-04-12 DIAGNOSIS — N959 Unspecified menopausal and perimenopausal disorder: Secondary | ICD-10-CM

## 2013-04-12 MED ORDER — PROGESTERONE 200 MG PO CAPS *I*
200.0000 mg | ORAL_CAPSULE | Freq: Every evening | ORAL | Status: DC
Start: 2013-04-12 — End: 2013-07-12

## 2013-04-12 NOTE — Telephone Encounter (Signed)
Rx for Prometrium filled.

## 2013-04-12 NOTE — Telephone Encounter (Addendum)
PC to patient.  Spoke with patient 2 days ago.  She will be moving to [place], [place] in the next 4 weeks.  Labs discussed.  Thyroid levels are consitent with subclinical hypothyroidism.  Have discussed with her adding Cytomel.  Patient would like to start this.  Rx written.  Labs to be rechecked in 6 weeks and 3 months.  Lab requisitions mailed to patient for these tests.  Patient will most likely have these done wherever she moves.     Patient was considering going to a dermatologist due to acne.  She had decreased her Testosterone down to 1/2 pump daily.  She was concerned that the 7 Keto DHEA was contributing to the acne.  Will have her decrease the Testosterone to 1/2 pump every 2-3 days.  She will add Selenium 200 mcg po daily.

## 2013-04-12 NOTE — Telephone Encounter (Signed)
Please let patient know I refilled her Prometrium.    Thanks!

## 2013-04-12 NOTE — Telephone Encounter (Signed)
Patient called office requesting a refill of Prometrium 200 mg, 90 day supply be sent to [facility].    Writer to call patient when complete.    Will route to provider.    Beecher Mcardle, RN

## 2013-04-13 NOTE — Telephone Encounter (Signed)
TC to patient and informed her that the Rx was refilled and sent to her pharmacy. Patient appreciated.    Beecher Mcardle, RN

## 2013-04-17 ENCOUNTER — Telehealth: Payer: Self-pay | Admitting: Primary Care

## 2013-04-17 ENCOUNTER — Other Ambulatory Visit: Payer: Self-pay | Admitting: Primary Care

## 2013-04-17 DIAGNOSIS — Z139 Encounter for screening, unspecified: Secondary | ICD-10-CM

## 2013-04-17 NOTE — Telephone Encounter (Signed)
Patient is moving out of the area and would like to see JA before her departure.    Patient is available after Thanksgiving thru 12/10  Please advise where to schedule   [phone]

## 2013-04-17 NOTE — Telephone Encounter (Signed)
Can you try to find a time.  O.K to use a 12:30 if nothing available.

## 2013-04-17 NOTE — Telephone Encounter (Signed)
appt made

## 2013-04-20 ENCOUNTER — Ambulatory Visit
Admit: 2013-04-20 | Discharge: 2013-04-20 | Disposition: A | Discharge status: Home / Self Care | Payer: Self-pay | Source: Ambulatory Visit | Attending: Primary Care | Admitting: Primary Care

## 2013-04-20 LAB — LIPID PANEL
Chol/HDL Ratio: 2.6
Cholesterol: 191 mg/dL
HDL: 74 mg/dL
LDL Calculated: 102 mg/dL
Non HDL Cholesterol: 117 mg/dL
Triglycerides: 74 mg/dL

## 2013-04-20 LAB — GLUCOSE: Glucose: 88 mg/dL (ref 60–99)

## 2013-05-01 ENCOUNTER — Encounter: Payer: Self-pay | Admitting: Primary Care

## 2013-05-01 ENCOUNTER — Ambulatory Visit: Payer: Self-pay | Admitting: Primary Care

## 2013-05-01 VITALS — BP 102/70 | HR 72 | Ht 65.0 in | Wt 133.0 lb

## 2013-05-01 DIAGNOSIS — G608 Other hereditary and idiopathic neuropathies: Secondary | ICD-10-CM

## 2013-05-01 DIAGNOSIS — M2550 Pain in unspecified joint: Secondary | ICD-10-CM

## 2013-05-01 MED ORDER — HYDROCODONE-ACETAMINOPHEN 5-325 MG PO TABS *I*
1.0000 | ORAL_TABLET | Freq: Four times a day (QID) | ORAL | Status: DC | PRN
Start: 2013-05-01 — End: 2013-10-12

## 2013-05-05 ENCOUNTER — Encounter: Payer: Self-pay | Admitting: Primary Care

## 2013-05-05 NOTE — Progress Notes (Signed)
Subjective:      Patient ID: [name] is a 57 y.o. year old female.    Chief Complaint   Patient presents with   . 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  She has had a mild flare-up of her pain over the last few days but overall there has been a mild improvement in both her tingling and joint pain.  She did find that working part-time helped her tingling and joint pains.  She has been under stress as she is soon to move to [place].  She is looking forward to the move as she will be closer to her daughter and grandchildren.  Her spirits are good.    Allergy /Medications/ Problem List/ Social History / :  Allergies   Allergen Reactions   . Amoxicillin Hives   . Demerol Nausea And Vomiting   . Environmental Allergies    . Erythromycin      ABDOMINAL PAIN   . Gluten Meal      BLOATING, SNEEZING, WATERY EYES, NEUROPATHY FLARE   . Sulfa Drugs Hives     Current Outpatient Prescriptions on File Prior to Visit   Medication Sig Dispense Refill   . progesterone (PROMETRIUM) 200 MG capsule Take 1 capsule (200 mg total) by mouth at bedtime  90 capsule  4   . liothyronine (CYTOMEL) 5 MCG tablet Take 1 tablet (5 mcg total) by mouth daily  90 tablet  4   . valACYclovir (VALTREX) 1 GM tablet TAKE 1 TABLET DAILY  90 tablet  3   . Non-System Medication Physical therapy to back and right knee.  Evaluate and treat.  Dx: back pain and right knee pain  8 each  1   . SUMAtriptan (IMITREX) 100 MG tablet TAKE ONE TABLET BY MOUTH FOR MIGRAINE RELIEF. MAY REPEAT TWO HOURS LATER. MAXIMUM DAILY DOSE 200MG  PER DAY  9 tablet  5   . fluticasone (FLOVENT HFA) 110 MCG/ACT inhaler INHALE 2 PUFFS TWICE DAILY  1 Inhaler  5   . estradiol (CLIMARA) 0.0375 MG/24HR patch Once a week       . gabapentin (NEURONTIN) 300 MG capsule 3 capsules at bedtime  270 capsule  3   . cyclobenzaprine (FLEXERIL) 5 MG tablet Take 1 tablet  (5 mg total) by mouth 3 times daily as needed for Muscle spasms  30 tablet  2   . dexlansoprazole (DEXILANT) 60 MG capsule TAKE 1 CAPSULE DAILY EVERY MORNING BEFORE BREAKFAST.  90 capsule  3   . Hyoscyamine Sulfate 0.125 MG TBDP Take 1 tablet by mouth Q4-6H PRN  30 each  1   . cetirizine (ZYRTEC) 10 MG tablet Take 10 mg by mouth daily       . mometasone (NASONEX) 50 MCG/ACT nasal spray 2 sprays by Each Nare route daily  17 g  1   . 7-KETO DHEA Take 50 mg by mouth daily          . Probiotic Product (PROBIOTIC FORMULA PO) Take 1 capsule by mouth daily       . DIGESTIVE ENZYMES PO Take 1 capsule by mouth daily       . butalbital-acetaminophen-caffeine (FIORICET, ESGIC) 50-325-40 MG per tablet 1 OR 2 TABLETS EVERY 4 TO 6 HOURS AS NEEDED FOR HEADACHE.  30 tablet  1   .  B Complex Vitamins (VITAMIN B COMPLEX) capsule Take 2 capsules by mouth every 12 hours   Neuropathy multivitamin        . Cholecalciferol (VITAMIN D) 1000 UNIT tablet TAKE 1 TABLET DAILY.    0   . Magnesium 200 MG TABS TAKE 1 TABLET DAILY AS DIRECTED.    0     No current facility-administered medications on file prior to visit.     Patient Active Problem List   Diagnosis Code   . Depression 311   . Internal Hemorrhoids 455.0   . Esophageal reflux 530.81   . Acne 706.1   . 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 Joint Pain, Localized In The Knee 719.46   . Allergic Rhinitis 477.9   . Irritable bowel syndrome 564.1   . Gastroparesis 536.3   . Migraine Headache 346.90   . Tingling (Paresthesia) 782.0   . Epigastric pain 789.06   . 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 Family history of muscular dystrophy V17.2   . Elevated CK 790.5   . Fecal incontinence 787.60   . Rosacea 695.3   . Family history of melanoma V16.8   . Dermatitis 692.9   . Fibromyalgia 729.1   . Multiple joint pain 719.49   . Family history of Wegener's granulomatosis, father V18.3   . Fatigue 780.79     History   Substance Use Topics   . Smoking status:  Former Smoker     Types: Cigarettes     Quit date: 10/07/1980   . Smokeless tobacco: Never Used      Comment: rare cigarettes   . Alcohol Use: 3.0 oz/week     5 Glasses of wine per week         Objective:     Recent Lab Results:  Lab Results   Component Value Date    NA 138 02/28/2013    K 4.1 02/28/2013    CL 100 02/28/2013    CO2 23 02/28/2013    UN 9 02/28/2013    CREAT 0.64 02/28/2013    VID25 68* 02/28/2013    WBC 7.2 02/28/2013    HGB 14.3 02/28/2013    HCT 42 02/28/2013    PLT 273 02/28/2013    TSH 3.11 03/21/2013    CHOL 191 04/20/2013    TRIG 74 04/20/2013    HDL 74 04/20/2013    LDLC 161 04/20/2013    CHHDC 2.6 04/20/2013     Physical Exam:  Filed Vitals:    05/01/13 0947   BP: 102/70   Pulse: 72   Height: 1.651 m (5\' 5" )   Weight: 60.328 kg (133 lb)      Estimated body mass index is 22.13 kg/(m^2) as calculated from the following:    Height as of this encounter: 1.651 m (5\' 5" ).    Weight as of this encounter: 60.328 kg (133 lb).     The patient is a healthy-appearing 57 year old woman.  Her lungs are clear.  Cardiac exam shows a regular rhythm.    Assessment / Plan:     1.  Paresthesias secondary to a small fiber neuropathy.  Overall, there has been a mild improvement in her tingling.  --She will continue gabapentin at bedtime.    2.  Multiple joint pains and back pain are symptomatically better.  There is no evidence for an underlying inflammatory arthritis.    She will continue her current medications.    Her medical records will  be transferred to her new physician in [place] once she establishes care.

## 2013-06-08 ENCOUNTER — Encounter: Payer: Self-pay | Admitting: Surgery

## 2013-06-12 ENCOUNTER — Encounter: Payer: Self-pay | Admitting: Primary Care

## 2013-06-18 ENCOUNTER — Encounter: Payer: Self-pay | Admitting: Surgery

## 2013-06-18 ENCOUNTER — Telehealth: Payer: Self-pay | Admitting: Surgery

## 2013-06-18 DIAGNOSIS — E039 Hypothyroidism, unspecified: Secondary | ICD-10-CM

## 2013-06-18 DIAGNOSIS — N959 Unspecified menopausal and perimenopausal disorder: Secondary | ICD-10-CM

## 2013-06-18 MED ORDER — ESTRADIOL 0.0375 MG/24HR TD PTWK *I*
1.0000 | MEDICATED_PATCH | TRANSDERMAL | Status: DC
Start: 2013-06-18 — End: 2014-08-06

## 2013-06-18 MED ORDER — LIOTHYRONINE SODIUM 5 MCG PO TABS *I*
5.0000 ug | ORAL_TABLET | Freq: Every day | ORAL | Status: DC
Start: 2013-06-18 — End: 2014-05-13

## 2013-06-18 NOTE — Telephone Encounter (Signed)
I took care of her Rx's.  Please make sure they went to the correct [facility].  Thanks!

## 2013-06-22 NOTE — Telephone Encounter (Signed)
Left message on patient identified and approved VM requesting patient return call to the office to verify what pharmacy she uses.    Dorise Bullion, RN

## 2013-07-12 ENCOUNTER — Other Ambulatory Visit: Payer: Self-pay | Admitting: Surgery

## 2013-07-12 ENCOUNTER — Encounter: Payer: Self-pay | Admitting: Surgery

## 2013-07-12 DIAGNOSIS — N959 Unspecified menopausal and perimenopausal disorder: Secondary | ICD-10-CM

## 2013-07-12 MED ORDER — PROGESTERONE 200 MG PO CAPS *I*
200.0000 mg | ORAL_CAPSULE | Freq: Every evening | ORAL | Status: DC
Start: 2013-07-12 — End: 2013-10-03

## 2013-07-13 ENCOUNTER — Encounter: Payer: Self-pay | Admitting: Surgery

## 2013-08-10 ENCOUNTER — Encounter: Payer: Self-pay | Admitting: Primary Care

## 2013-08-10 MED ORDER — GABAPENTIN 300 MG PO CAPS
900.0000 mg | ORAL_CAPSULE | Freq: Every evening | ORAL | Status: DC
Start: 2013-08-10 — End: 2015-01-03

## 2013-08-10 MED ORDER — FLUTICASONE PROPIONATE HFA 110 MCG/ACT IN AERO *I*
INHALATION_SPRAY | RESPIRATORY_TRACT | Status: DC
Start: 2013-08-10 — End: 2017-03-07

## 2013-08-10 MED ORDER — VALACYCLOVIR HCL 1000 MG PO TABS *I*
ORAL_TABLET | ORAL | Status: DC
Start: 2013-08-10 — End: 2015-01-03

## 2013-08-21 ENCOUNTER — Encounter: Payer: Self-pay | Admitting: Surgery

## 2013-09-20 ENCOUNTER — Encounter: Payer: Self-pay | Admitting: Gastroenterology

## 2013-09-21 ENCOUNTER — Encounter: Payer: Self-pay | Admitting: Surgery

## 2013-09-27 ENCOUNTER — Encounter: Payer: Self-pay | Admitting: Surgery

## 2013-10-01 ENCOUNTER — Encounter: Payer: Self-pay | Admitting: Surgery

## 2013-10-03 ENCOUNTER — Encounter: Payer: Self-pay | Admitting: Surgery

## 2013-10-03 DIAGNOSIS — N959 Unspecified menopausal and perimenopausal disorder: Secondary | ICD-10-CM

## 2013-10-03 MED ORDER — PROGESTERONE MICRONIZED 100 MG PO CAPS
200.0000 mg | ORAL_CAPSULE | Freq: Every evening | ORAL | Status: DC
Start: 2013-10-03 — End: 2013-10-05

## 2013-10-05 ENCOUNTER — Telehealth: Payer: Self-pay

## 2013-10-05 DIAGNOSIS — N959 Unspecified menopausal and perimenopausal disorder: Secondary | ICD-10-CM

## 2013-10-05 MED ORDER — PROGESTERONE 100 MG PO CAPS *I*
100.0000 mg | ORAL_CAPSULE | Freq: Every day | ORAL | Status: DC
Start: 2013-10-05 — End: 2014-10-11

## 2013-10-05 NOTE — Telephone Encounter (Signed)
I want her to have the 100 mg Rx.  I didn't realize that there was some incorrect info in the Rx.  New Rx written.

## 2013-10-05 NOTE — Telephone Encounter (Signed)
[facility] [phone], reference # [id number]) left message on nurse line requesting clarification of progesterone prescription.  Prescription written for 100 mg progesterone, instructions state to take 200 mg daily with a qty of 90.  Per pharmacy, patient has been on the 200 mg tablet, inquiring about increasing the dose to 200 mg so then the patient would have a 90 day supply or increasing the supply to 180 to provide with a 90 day supply.    Will consult with provider regarding clarification.    Dorise Bullion, RN

## 2013-10-12 ENCOUNTER — Encounter: Payer: Self-pay | Admitting: Primary Care

## 2013-10-12 ENCOUNTER — Other Ambulatory Visit: Payer: Self-pay | Admitting: Primary Care

## 2013-10-12 MED ORDER — HYDROCODONE-ACETAMINOPHEN 5-325 MG PO TABS *I*
1.0000 | ORAL_TABLET | Freq: Four times a day (QID) | ORAL | Status: DC | PRN
Start: 2013-10-12 — End: 2014-03-19

## 2013-10-16 ENCOUNTER — Telehealth: Payer: Self-pay

## 2013-10-16 DIAGNOSIS — B373 Candidiasis of vulva and vagina: Secondary | ICD-10-CM

## 2013-10-16 DIAGNOSIS — B3731 Acute candidiasis of vulva and vagina: Secondary | ICD-10-CM

## 2013-10-16 MED ORDER — FLUCONAZOLE 150 MG PO TABS *I*
150.0000 mg | ORAL_TABLET | Freq: Once | ORAL | Status: AC
Start: 2013-10-16 — End: 2013-10-16

## 2013-10-16 NOTE — Telephone Encounter (Signed)
Patient called office stating that she just got back in to town from traveling and has a yeast infection.  Reported vaginal irritation and white cottage cheese like discharge.  Irritation started a few days ago, discharge started last night.  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  Requesting a Rx for Diflucan.    [facility]    Transferred to Network engineer to schedule annual appointment.    Will route request to provider.    Dorise Bullion, RN

## 2013-10-16 NOTE — Telephone Encounter (Signed)
Patient left message on nurse line requesting an update on her request for Diflucan as she checked with her pharmacy and a Rx was not there.    Writer to follow up.    Dorise Bullion, RN

## 2013-10-19 ENCOUNTER — Ambulatory Visit: Payer: Self-pay | Admitting: Surgery

## 2013-10-19 ENCOUNTER — Encounter: Payer: Self-pay | Admitting: Surgery

## 2013-10-19 VITALS — BP 110/74 | HR 64 | Ht 65.0 in | Wt 134.0 lb

## 2013-10-19 DIAGNOSIS — Z79890 Hormone replacement therapy (postmenopausal): Secondary | ICD-10-CM

## 2013-10-19 DIAGNOSIS — Z780 Asymptomatic menopausal state: Secondary | ICD-10-CM

## 2013-10-19 DIAGNOSIS — Z01419 Encounter for gynecological examination (general) (routine) without abnormal findings: Secondary | ICD-10-CM

## 2013-10-19 DIAGNOSIS — R5383 Other fatigue: Secondary | ICD-10-CM

## 2013-10-19 NOTE — Progress Notes (Signed)
GYN ANNUAL EXAM:     [name] is a 58 y.o. G2P2-1-0-1.postmenopausal   female who presents for an annual GYN exam. The patient complains of fatigue.  She had been feeling great but has had a relapse.  She complains of not slleping well and having increased anxiety related to the depression that she has had to deal with for a long time.  She has had a recent HSV 2 outbreak, too.    She needs a refill of her Estriol Facial serum.  She does fell that this really helps.    She moved to [place], [place] area earlier this year.  She is visiting family this week.  She denies postmenopausal bleeding or spotting.  She was treated for a vaginal yeast infection earlier this week.    GYN History  LMP: postmenopausal  Last pap smear: Date: 08/15/2012  Results: no abnormalities  History of abnormal pap smear: No.   The patient is sexually active.   Sexually active: single partner, contraception - post menopausal status  Together with partner: married   STD History: HSV  Current contraception: post menopausal status    Screening History:  Gyn screening history: last pap: was normal and last mammogram: approximate date 12/22/2012 and was normal .  Last Dexascan: 03/16/2012  Osteopenia in spine only:  -1.2  Last colonoscopy: is due now  The patient is taking hormone replacement therapy. Patient denies post-menopausal vaginal bleeding.  The patient wears seatbelts:yes  The patient participates in regular exercise: yes  Has the patient ever been transfused or tattooed?:no  Lives with her husband.  Domestic violence:No    Patient's medications, allergies, past medical, surgical, social and family histories were reviewed and updated as appropriate.    Review of Systems  Pertinent items are noted in HPI.      Objective:      BP 110/74    Pulse 64    Ht 1.651 m (5\' 5" )    Wt 60.782 kg (134 lb)    BMI 22.30 kg/m2     General appearance: alert, appears stated age and cooperative  Neck: no adenopathy, supple, symmetrical, trachea  midline and thyroid not enlarged, symmetric, no tenderness/mass/nodules  Lungs: clear to auscultation bilaterally  Breasts: normal appearance, no masses or tenderness  Heart: regular rate and rhythm, S1, S2 normal, no murmur, click, rub or gallop  Abdomen: soft, non-tender; bowel sounds normal; no masses,  no organomegaly  Pelvic: cervix normal in appearance, external genitalia normal, no adnexal masses or tenderness, no cervical motion tenderness, positive findings: slightly atrophic vaginal vault, rectovaginal septum normal and uterus normal size, shape, and consistency, rectal-no masses or tenderness, no stool obtained  Extremities: extremities normal, atraumatic, no cyanosis or edema  Skin: Skin color, texture, turgor normal. No rashes or lesions  Lymph nodes: Cervical, supraclavicular, and axillary nodes normal.       Assessment:      [name] is a 58 y.o. female who presents for an annual GYN exam. She has the following concerns:   Encounter Diagnoses   Name Primary?    Routine gynecological examination Yes    Postmenopausal state     Postmenopausal HRT (hormone replacement therapy)     Fatigue        Plan:      1.  Candida albicans antibody (ACM)  2. Lab req for repeat labs while she is in [place]: 01/29/14: TSH, free T4, free T3, DHEAS, Pregnenolone, E2, and Progesterone  3. Recent labs re-discussed.  Patient is decreasing her Prometrium to 100 mg daily at bedtime.  4. Adrenal fatigue discussed.  5. Add Phosphatidylserine 200 mg BID to TID po.  6. RTO 1 year AV and prn.  7. Renew Estriol Facial Serum.  8. This was a 45 minute face to face visit and more than 50% of the time was spent in counseling and coordination of care.

## 2013-10-19 NOTE — Telephone Encounter (Signed)
Pt picked up script and showed ID

## 2013-10-23 MED ORDER — NON-SYSTEM MEDICATION *A*
Status: DC
Start: 2013-10-23 — End: 2013-10-23

## 2013-10-23 MED ORDER — NON-SYSTEM MEDICATION *A*
Status: DC
Start: 2013-10-23 — End: 2015-11-13

## 2013-10-24 ENCOUNTER — Encounter: Payer: Self-pay | Admitting: Surgery

## 2013-10-25 NOTE — Telephone Encounter (Signed)
My Chart message sent

## 2013-10-26 ENCOUNTER — Encounter: Payer: Self-pay | Admitting: Surgery

## 2013-11-05 ENCOUNTER — Encounter: Payer: Self-pay | Admitting: Surgery

## 2013-11-05 NOTE — Telephone Encounter (Signed)
Nystatin oral tablets 500,000 units #90 one tab PO TID for 1 month with 1 refill [facility] [address] [place], [place] [phone]) as per Lacretia Nicks, PA.      Dorise Bullion, RN

## 2013-11-05 NOTE — Telephone Encounter (Signed)
I have ordered Nystatin Oral tablets 500,000 units  #90 one tab po TID for 1 month with 1 RF.  I cannot send this electronically.  Please call this in.  [facility] [place]  [address]   Phone number: [phone].  Thanks.

## 2014-01-23 ENCOUNTER — Telehealth: Payer: Self-pay | Admitting: Surgery

## 2014-01-23 NOTE — Telephone Encounter (Signed)
My Chart message sent

## 2014-02-05 ENCOUNTER — Telehealth: Payer: Self-pay | Admitting: Primary Care

## 2014-02-05 NOTE — Telephone Encounter (Signed)
Patient called stating that she is no longer living in [place], but will be visiting 10/16-10/23. She would like to come in for a general follow-up appointment anytime during that time. Please advise regarding scheduling.

## 2014-02-05 NOTE — Telephone Encounter (Signed)
See if you can find an acute slot to see Kyndel during that time frame.  If not, let me know.

## 2014-02-05 NOTE — Telephone Encounter (Signed)
Spoke with patient and she is scheduled for 03/19/14 at 1pm.  -- Raymone Pembroke

## 2014-02-26 ENCOUNTER — Telehealth: Payer: Self-pay | Admitting: Surgery

## 2014-02-26 NOTE — Telephone Encounter (Signed)
Phone call to patient.  Inquiry from lawyer is regarding mesh surgery in the past.  Incontinence is worsening.  She wishes she would not have had surgery.      She is coming to [place] in 3 weeks.  She has an appointment with her PCP then.  She will be making an appointment to be seen here also.  She recently also started having a bruised sensation on the right side of the vagina.  She will need to be seen for this.  Labs re-reviewed.  She will increase her present dose of Pregnenolone 15 drops per day (5 mg/10 drops) to 20 drops per day.  She will increase her present dose of 7 Keto KHEA of 100 mg to 125 mg po daily by adding a 25 mg tab daily to the 100 mg that she is on.  She needs to have a Urinalysis and a Urine culture done down in [place].  Will fax an order to her lab [facility]).

## 2014-02-26 NOTE — Telephone Encounter (Signed)
Lab request faxed to [facility].  [place], [place]. [phone].  Phone: [phone]    Bettina Gavia, [place]

## 2014-03-13 ENCOUNTER — Telehealth: Payer: Self-pay | Admitting: Surgery

## 2014-03-13 NOTE — Telephone Encounter (Signed)
My Chart message sent

## 2014-03-19 ENCOUNTER — Ambulatory Visit: Payer: Self-pay | Admitting: Surgery

## 2014-03-19 ENCOUNTER — Ambulatory Visit: Payer: Self-pay | Admitting: Primary Care

## 2014-03-19 ENCOUNTER — Other Ambulatory Visit: Payer: Self-pay | Admitting: Primary Care

## 2014-03-19 ENCOUNTER — Encounter: Payer: Self-pay | Admitting: Surgery

## 2014-03-19 ENCOUNTER — Encounter: Payer: Self-pay | Admitting: Primary Care

## 2014-03-19 VITALS — BP 118/66 | HR 72 | Ht 65.0 in | Wt 119.8 lb

## 2014-03-19 VITALS — BP 104/64 | HR 64 | Ht 65.0 in | Wt 118.8 lb

## 2014-03-19 DIAGNOSIS — L659 Nonscarring hair loss, unspecified: Secondary | ICD-10-CM

## 2014-03-19 DIAGNOSIS — Z23 Encounter for immunization: Secondary | ICD-10-CM

## 2014-03-19 DIAGNOSIS — R102 Pelvic and perineal pain: Secondary | ICD-10-CM

## 2014-03-19 DIAGNOSIS — J302 Other seasonal allergic rhinitis: Secondary | ICD-10-CM

## 2014-03-19 DIAGNOSIS — Z139 Encounter for screening, unspecified: Secondary | ICD-10-CM

## 2014-03-19 DIAGNOSIS — R6882 Decreased libido: Secondary | ICD-10-CM

## 2014-03-19 LAB — POCT URINALYSIS DIPSTICK
Blood,UA POCT: NEGATIVE
Glucose,UA POCT: NORMAL
Ketones,UA POCT: NEGATIVE
Leuk Esterase,UA POCT: NEGATIVE
Lot #: 20308902
Nitrite,UA POCT: NEGATIVE
PH,UA POCT: 9 (ref 5–8)
Protein,UA POCT: NEGATIVE mg/dL

## 2014-03-19 MED ORDER — MONTELUKAST SODIUM 10 MG PO TABS *I*
10.0000 mg | ORAL_TABLET | Freq: Every evening | ORAL | Status: DC
Start: 2014-03-19 — End: 2014-04-23

## 2014-03-19 MED ORDER — TRAMADOL HCL 50 MG PO TABS *I*
50.0000 mg | ORAL_TABLET | Freq: Four times a day (QID) | ORAL | Status: DC | PRN
Start: 2014-03-19 — End: 2015-11-13

## 2014-03-19 NOTE — Progress Notes (Addendum)
GYN Visit    CC: Vaginal pain, decreased libido    S: [name] is a 58 y.o. G2P1101 who presents with concern of vaginal pain and decreased libido.  She has had decreased libido for 5 years.  She is not sure if the vaginal estrogen that she has been using is helping.  She is working with law firm in regards to problems that have arisen since she had bladder mesh surgery.  She has areas on the side walls of the vagina that are painful, especially during intercourse.  She has urgency but not incontinence at this time.  Her recent urinalysis and culture were negative.  She is currently still taking oral Nystatin.  She increased her Pregnenolone after her last blood work results.  She is traveling here until 03/25/14 visiting family.  She lives in [place].  Her PCP here has also ordered labs for her.  She went on the "Whole 30" diet program in June and lost 15 pounds.  She has felt somewhat better with many issues since doing this.  She also complains of hair loss.  She has noticed an increase in this in the past month or so.    Patient's medications, allergies, past medical, surgical, social and family histories were reviewed and updated as appropriate.    GYN Hx:   No LMP recorded. Patient has had an ablation.    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    Joint Pain, Localized In The Knee M25.569    Allergic Rhinitis J30.9    Irritable bowel syndrome K58.9    Gastroparesis K31.84    Migraine Headache G43.909    Tingling (Paresthesia) R20.9    Epigastric pain R10.13    Idiopathic small fiber sensory neuropathy G60.3    Family history of muscular dystrophy Z82.0    Elevated CK R74.8    Fecal incontinence R15.9    Rosacea L71.9    Family history of melanoma Z80.8    Dermatitis L30.9    Fibromyalgia M79.7    Multiple joint pain M25.50    Family  history of Wegener's granulomatosis, father Z74.2    Fatigue R53.83           PE:   Filed Vitals:    03/19/14 1447   BP: 104/64   Pulse: 64   Height: 1.651 m (5\' 5" )   Weight: 53.887 kg (118 lb 12.8 oz)   Body mass index is 19.77 kg/(m^2).      General: well developed and well nourished, alert and oriented, in no acute distress  Abdomen: soft, non-tender; bowel sounds normal; no masses,  no organomegaly  Pelvic: VULVA: normal appearing vulva with no masses, tenderness or lesions, VAGINA: atrophic, vaginal discharge - white and curd-like and scant amount, tenderness along lateral vaginal walls along hymenal ring, CERVIX: normal appearing cervix without discharge or lesions    Wet Prep: no pathogens      A: 58 y.o. female G2P1101 presenting to the office for     ICD-10-CM ICD-9-CM   1. Hair loss L65.9 704.00   2. Vaginal pain R10.2 625.9   3. Decreased libido R68.82 799.81     P:  1.   Orders Placed This Encounter   Procedures    Vaginitis screen: DNA probe    Vitamin D    Ferritin  Vitamin B12    High sensitivity CRP    ZINC    POCT urinalysis dipstick     2. Recommend patient get a 2nd opinion on the vaginal pain and tenderness from a Urogynecologist.  She could have scar tissue from her mesh surgery.  3. Decreased libido discussed.  Patient would be interested in using Adiiya (the new libido medication) and also Oxytocin.  Oxytocin could be use SL as well as intranasally before intercourse.  Use of topical steroids discussed.  Use of lose dose Viagra or Cialis discussed.  Will let her know when I have researched the compounded Oxytocin thing a bit more.  Could also consider vaginal Valium use.  4. This was a 35 minute face to face visit and more than 50% of the time was spent in counseling and coordination of care.  5. RTO next time she is in [place], which will probably be in the spring.  6. Repeat labs after 05/31/2014.  Rx given for this.  Patient can have these done at her local lab in [place].  Tests  ordered: Pregnenolone, DHEAS, TSH, free T4, free T3 and Candida albicans antibody

## 2014-03-21 ENCOUNTER — Ambulatory Visit
Admit: 2014-03-21 | Discharge: 2014-03-21 | Disposition: A | Discharge status: Home / Self Care | Payer: Self-pay | Source: Ambulatory Visit | Attending: Primary Care | Admitting: Primary Care

## 2014-03-21 ENCOUNTER — Telehealth: Payer: Self-pay | Admitting: Surgery

## 2014-03-21 DIAGNOSIS — B3731 Acute candidiasis of vulva and vagina: Secondary | ICD-10-CM

## 2014-03-21 DIAGNOSIS — B373 Candidiasis of vulva and vagina: Secondary | ICD-10-CM

## 2014-03-21 DIAGNOSIS — Z139 Encounter for screening, unspecified: Secondary | ICD-10-CM

## 2014-03-21 DIAGNOSIS — L659 Nonscarring hair loss, unspecified: Secondary | ICD-10-CM

## 2014-03-21 LAB — HIGH SENSITIVITY CRP: CRP,High Sensitivity: 3.4 mg/L — AB

## 2014-03-21 LAB — COMPREHENSIVE METABOLIC PANEL
ALT: 23 U/L (ref 0–35)
AST: 26 U/L (ref 0–35)
Albumin: 4.5 g/dL (ref 3.5–5.2)
Alk Phos: 62 U/L (ref 35–105)
Anion Gap: 12 (ref 7–16)
Bilirubin,Total: 0.4 mg/dL (ref 0.0–1.2)
CO2: 26 mmol/L (ref 20–28)
Calcium: 9.3 mg/dL (ref 8.6–10.2)
Chloride: 103 mmol/L (ref 96–108)
Creatinine: 0.65 mg/dL (ref 0.51–0.95)
GFR,Black: 114 *
GFR,Caucasian: 99 *
Glucose: 93 mg/dL (ref 60–99)
Lab: 10 mg/dL (ref 6–20)
Potassium: 4.8 mmol/L (ref 3.3–5.1)
Sodium: 141 mmol/L (ref 133–145)
Total Protein: 7.1 g/dL (ref 6.3–7.7)

## 2014-03-21 LAB — VAGINITIS SCREEN: DNA PROBE: Vaginitis Screen:DNA Probe: POSITIVE

## 2014-03-21 LAB — MULTIPLE ORDERING DOCS

## 2014-03-21 LAB — CBC
Hematocrit: 42 % (ref 34–45)
Hemoglobin: 14 g/dL (ref 11.2–15.7)
MCH: 33 pg/cell — ABNORMAL HIGH (ref 26–32)
MCHC: 33 g/dL (ref 32–36)
MCV: 97 fL — ABNORMAL HIGH (ref 79–95)
Platelets: 238 10*3/uL (ref 160–370)
RBC: 4.3 MIL/uL (ref 3.9–5.2)
RDW: 13.8 % (ref 11.7–14.4)
WBC: 7.1 10*3/uL (ref 4.0–10.0)

## 2014-03-21 LAB — LIPID PANEL
Chol/HDL Ratio: 2.3
Cholesterol: 215 mg/dL — AB
HDL: 93 mg/dL
LDL Calculated: 109 mg/dL
Non HDL Cholesterol: 122 mg/dL
Triglycerides: 66 mg/dL

## 2014-03-21 LAB — VITAMIN B12: Vitamin B12: 856 pg/mL (ref 211–946)

## 2014-03-21 LAB — FERRITIN: Ferritin: 97 ng/mL (ref 10–120)

## 2014-03-21 MED ORDER — FLUCONAZOLE 150 MG PO TABS *I*
ORAL_TABLET | ORAL | Status: DC
Start: 2014-03-21 — End: 2014-03-23

## 2014-03-21 NOTE — Telephone Encounter (Signed)
Please let this patient know that her culture was positive for yeast.  She is in the [place] area til Monday.  I have sent a Rx for Diflucan x 2 to her [facility] pharmacy.

## 2014-03-21 NOTE — Telephone Encounter (Signed)
Telephone call to patient and informed of + yeast on her culture.  Advised a Rx for Diflucan x2 was sent to her pharmacy.  Patient verbalized understanding and is agreeable.    Dorise Bullion, RN

## 2014-03-23 ENCOUNTER — Encounter: Payer: Self-pay | Admitting: Primary Care

## 2014-03-23 DIAGNOSIS — M545 Low back pain, unspecified: Secondary | ICD-10-CM | POA: Insufficient documentation

## 2014-03-23 LAB — ZINC: Zinc: 102 ug/dL (ref 60–120)

## 2014-03-23 NOTE — Progress Notes (Signed)
Subjective:      Patient ID: [name] is a 58 y.o. year old female.    Chief Complaint   Patient presents with    Follow-up     Seasonal allergies and chronic low back pain       HPI:    The patient is a 58 year old woman who comes in today for a routine office visit.  She has moved to the [place], [place] area but she has not yet established with a new primary care physician.  She is back in the [place] area for a short time visiting family and friends.  She is happy with her move to [place] as she is closer to her daughter and grandchildren.    She complains of increased seasonal allergies with nasal and chest congestion, cough and chest tightness.  She has tried OTC antihistamines and nasal steroids without much help.    She has intermittent low back pain.  She is going to physical therapy.  She infrequently uses tramadol which helps.    She has no other complaints.    Allergy /Medications/ Problem List/ Social History / :  Allergies   Allergen Reactions    Amoxicillin Hives    Demerol Nausea And Vomiting    Environmental Allergies     Erythromycin      ABDOMINAL PAIN    Gluten Meal      BLOATING, SNEEZING, WATERY EYES, NEUROPATHY FLARE    Sulfa Drugs Hives     Current Outpatient Prescriptions on File Prior to Visit   Medication Sig Dispense Refill    Non-System Medication Medication/Supply: Estriol Facial Serum 0.3%  Directions for Use: Apply 1-2 pumps to face and neck every other day  Each=ml 30 each 4    progesterone (PROMETRIUM) 100 MG capsule Take 1 capsule (100 mg total) by mouth daily 90 capsule 3    gabapentin (NEURONTIN) 300 MG capsule Take 3 capsules (900 mg total) by mouth nightly 270 capsule 3    fluticasone (FLOVENT HFA) 110 MCG/ACT inhaler INHALE 2 PUFFS TWICE DAILY 3 Inhaler 3    valACYclovir (VALTREX) 1 GM tablet TAKE 1 TABLET DAILY 90 tablet 3    liothyronine (CYTOMEL) 5 MCG tablet Take 1 tablet (5 mcg total) by mouth daily 90 tablet 4    estradiol (CLIMARA) 0.0375  MG/24HR patch Place 1 patch onto the skin once a week   Once a week 12 patch 4    SUMAtriptan (IMITREX) 100 MG tablet TAKE ONE TABLET BY MOUTH FOR MIGRAINE RELIEF. MAY REPEAT TWO HOURS LATER. MAXIMUM DAILY DOSE 200MG  PER DAY 9 tablet 5    cyclobenzaprine (FLEXERIL) 5 MG tablet Take 1 tablet (5 mg total) by mouth 3 times daily as needed for Muscle spasms 30 tablet 2    cetirizine (ZYRTEC) 10 MG tablet Take 10 mg by mouth daily      mometasone (NASONEX) 50 MCG/ACT nasal spray 2 sprays by Each Nare route daily 17 g 1    7-KETO DHEA Take 50 mg by mouth daily         Probiotic Product (PROBIOTIC FORMULA PO) Take 1 capsule by mouth daily      DIGESTIVE ENZYMES PO Take 1 capsule by mouth daily      butalbital-acetaminophen-caffeine (FIORICET, ESGIC) 50-325-40 MG per tablet 1 OR 2 TABLETS EVERY 4 TO 6 HOURS AS NEEDED FOR HEADACHE. 30 tablet 1    B Complex Vitamins (VITAMIN B COMPLEX) capsule Take 2 capsules by mouth every 12 hours  Neuropathy multivitamin       Cholecalciferol (VITAMIN D) 1000 UNIT tablet TAKE 1 TABLET DAILY.  0    Magnesium 200 MG TABS TAKE 1 TABLET DAILY AS DIRECTED.  0     No current facility-administered medications on file prior to visit.     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    Joint Pain, Localized In The Knee M25.569    Allergic Rhinitis J30.9    Irritable bowel syndrome K58.9    Gastroparesis K31.84    Migraine Headache G43.909    Tingling (Paresthesia) R20.9    Epigastric pain R10.13    Idiopathic small fiber sensory neuropathy G60.3    Family history of muscular dystrophy Z82.0    Elevated CK R74.8    Fecal incontinence R15.9    Rosacea L71.9    Family history of melanoma Z80.8    Dermatitis L30.9    Fibromyalgia M79.7    Multiple joint pain M25.50    Family history of Wegener's granulomatosis, father  Z38.2    Fatigue R53.83     History   Substance Use Topics    Smoking status: Former Smoker     Types: Cigarettes     Quit date: 10/07/1980    Smokeless tobacco: Never Used      Comment: rare cigarettes    Alcohol Use: 3.0 oz/week     5 Glasses of wine per week         Objective:       Physical Exam:  Filed Vitals:    03/19/14 1307   BP: 118/66   Pulse: 72   Height: 1.651 m (5\' 5" )   Weight: 54.341 kg (119 lb 12.8 oz)      Estimated body mass index is 19.94 kg/(m^2) as calculated from the following:    Height as of this encounter: 1.651 m (5\' 5" ).    Weight as of this encounter: 54.341 kg (119 lb 12.8 oz).     The patient is a healthy-appearing 58 year old woman.  Her lungs are clear without wheezes or crackles.  Cardiac exam shows a regular rhythm without a murmur.    Assessment / Plan:     1.  Seasonal allergies are not under good control.  She is not responding well to OTC antihistamines or nasal steroids.  --Trial of Singulair 10 mg a day.  --If she does not respond to Singulair, she was encouraged to follow-up with an allergist in [place].    2.  Intermittent low back pain.  --She will continue physical therapy.  --She will sparingly use tramadol as needed.    3.  Preventive health care.  --Flu vaccine was given today.  --She was encouraged to establish with a primary care physician in [place] and to update her colonoscopy and mammogram.    Laboratory studies will be done including a fasting lipid profile, comprehensive metabolic profile and CBC.         Orders Placed This Encounter   Procedures    Flu vaccine Quadrivalent greater than or equal to 3yo with preservative IM    Lipid panel    Comprehensive metabolic panel    CBC

## 2014-03-25 LAB — VITAMIN D
25-OH VIT D2: <4 ng/mL
25-OH VIT D3: 42 ng/mL
25-OH Vit Total: 42 ng/mL (ref 30–60)

## 2014-03-27 ENCOUNTER — Encounter: Payer: Self-pay | Admitting: Surgery

## 2014-04-08 ENCOUNTER — Telehealth: Payer: Self-pay | Admitting: Surgery

## 2014-04-08 NOTE — Telephone Encounter (Signed)
Detailed myChart message sent to patient.    [name] G Archie Shea, PA

## 2014-04-15 ENCOUNTER — Encounter: Payer: Self-pay | Admitting: Surgery

## 2014-04-22 ENCOUNTER — Encounter: Payer: Self-pay | Admitting: Surgery

## 2014-04-22 ENCOUNTER — Encounter: Payer: Self-pay | Admitting: Primary Care

## 2014-04-23 ENCOUNTER — Other Ambulatory Visit: Payer: Self-pay | Admitting: Primary Care

## 2014-04-23 MED ORDER — MONTELUKAST SODIUM 10 MG PO TABS *I*
10.0000 mg | ORAL_TABLET | Freq: Every evening | ORAL | Status: DC
Start: 2014-04-23 — End: 2015-05-01

## 2014-05-13 ENCOUNTER — Other Ambulatory Visit: Payer: Self-pay | Admitting: Surgery

## 2014-05-13 ENCOUNTER — Encounter: Payer: Self-pay | Admitting: Surgery

## 2014-05-14 ENCOUNTER — Encounter: Payer: Self-pay | Admitting: Surgery

## 2014-05-14 ENCOUNTER — Telehealth: Payer: Self-pay | Admitting: Surgery

## 2014-05-14 ENCOUNTER — Telehealth: Payer: Self-pay

## 2014-05-14 DIAGNOSIS — B379 Candidiasis, unspecified: Secondary | ICD-10-CM

## 2014-05-14 MED ORDER — FLUCONAZOLE 150 MG PO TABS *I*
ORAL_TABLET | ORAL | Status: DC
Start: 2014-05-14 — End: 2015-11-13

## 2014-05-14 NOTE — Telephone Encounter (Signed)
Detailed myChart message sent to patient.    [name] G Regina Sonnier, PA

## 2014-05-14 NOTE — Telephone Encounter (Signed)
Phone call received from [facility] in [place], [place], inquiring if 100mg  tablets can be used for the Fluconazole order. Order is for 150mg , one and a half tablets would be the dose. 150mg  tablets are currently on back order. Lacretia Nicks, PA, approved of change. Verbal order given to pharmacist stating this is an acceptable alternative. No further questions brought up when asked.     Wannetta Sender, RN

## 2014-05-20 ENCOUNTER — Encounter: Payer: Self-pay | Admitting: Surgery

## 2014-05-29 ENCOUNTER — Telehealth: Payer: Self-pay

## 2014-05-29 DIAGNOSIS — R5383 Other fatigue: Secondary | ICD-10-CM

## 2014-05-29 DIAGNOSIS — N952 Postmenopausal atrophic vaginitis: Secondary | ICD-10-CM

## 2014-05-29 DIAGNOSIS — E038 Other specified hypothyroidism: Secondary | ICD-10-CM

## 2014-05-29 DIAGNOSIS — N951 Menopausal and female climacteric states: Secondary | ICD-10-CM

## 2014-05-29 DIAGNOSIS — IMO0002 Reserved for concepts with insufficient information to code with codable children: Secondary | ICD-10-CM

## 2014-05-29 MED ORDER — LIOTHYRONINE SODIUM 5 MCG PO TABS *I*
5.0000 ug | ORAL_TABLET | Freq: Two times a day (BID) | ORAL | Status: DC
Start: 2014-05-29 — End: 2017-10-20

## 2014-05-29 MED ORDER — NON-SYSTEM MEDICATION *A*
Status: DC
Start: 2014-05-29 — End: 2015-11-13

## 2014-05-29 NOTE — Telephone Encounter (Signed)
Patient called office with multiple concerns. States in August, her supplements were increased and her labs from 05/21/2014 have still gone down. Is wondering if she should increase the dose.     Also states that she has been out of her compounded vaginal cream for one month. Needs a new pharmacy for this and her facial serum. States she has been trying to contact [facility] via [place] and has had minimal responses.     Will route to provider and follow up with patient.    Wannetta Sender, RN

## 2014-05-29 NOTE — Telephone Encounter (Signed)
Detailed myChart message sent to patient.    Regina Dowland G Stephanye Finnicum, PA

## 2014-06-01 ENCOUNTER — Encounter: Payer: Self-pay | Admitting: Surgery

## 2014-06-05 ENCOUNTER — Other Ambulatory Visit: Payer: Self-pay | Admitting: Primary Care

## 2014-06-05 MED ORDER — ESCITALOPRAM OXALATE 10 MG PO TABS *I*
10.0000 mg | ORAL_TABLET | Freq: Every day | ORAL | Status: DC
Start: 2014-06-05 — End: 2015-11-13

## 2014-06-10 ENCOUNTER — Telehealth: Payer: Self-pay | Admitting: Surgery

## 2014-06-10 DIAGNOSIS — N951 Menopausal and female climacteric states: Secondary | ICD-10-CM

## 2014-06-10 DIAGNOSIS — E349 Endocrine disorder, unspecified: Secondary | ICD-10-CM

## 2014-06-10 MED ORDER — NON-SYSTEM MEDICATION *A*
Status: DC
Start: 2014-06-10 — End: 2015-11-13

## 2014-06-10 NOTE — Telephone Encounter (Signed)
Lab req mailed to patient on a Rx form for her to get her Estradiol and Progesterone levels checked the next time that she has labwork done.

## 2014-08-06 ENCOUNTER — Encounter: Payer: Self-pay | Admitting: Surgery

## 2014-08-06 DIAGNOSIS — N959 Unspecified menopausal and perimenopausal disorder: Secondary | ICD-10-CM

## 2014-08-06 MED ORDER — ESTRADIOL 0.0375 MG/24HR TD PTWK *I*
1.0000 | MEDICATED_PATCH | TRANSDERMAL | Status: DC
Start: 2014-08-06 — End: 2018-03-01

## 2014-08-06 NOTE — Telephone Encounter (Signed)
Patient sent MyChart message requesting a script for estradiol (CLIMARA) 0.0375 MG/24HR patch be sent to her pharmacy for a 90 day supply.    Pharmacy confirmed: [facility].    Last annual: 10/19/2013.    Prescribed by Lacretia Nicks, PA.    Will route to provider.    Wannetta Sender, RN

## 2014-10-06 ENCOUNTER — Other Ambulatory Visit: Payer: Self-pay | Admitting: Primary Care

## 2014-10-06 MED ORDER — IPRATROPIUM-ALBUTEROL 20-100 MCG/ACT IN AERS *I*
1.0000 | INHALATION_SPRAY | Freq: Four times a day (QID) | RESPIRATORY_TRACT | Status: DC
Start: 2014-10-06 — End: 2014-10-15

## 2014-10-11 ENCOUNTER — Other Ambulatory Visit: Payer: Self-pay

## 2014-10-11 DIAGNOSIS — N959 Unspecified menopausal and perimenopausal disorder: Secondary | ICD-10-CM

## 2014-10-11 MED ORDER — PROGESTERONE 100 MG PO CAPS *I*
100.0000 mg | ORAL_CAPSULE | Freq: Every day | ORAL | Status: DC
Start: 2014-10-11 — End: 2018-07-26

## 2014-10-11 NOTE — Telephone Encounter (Signed)
Office received faxed refill request for prometrium cap 100 mg, take 1 capsule daily.    Last annual: 10/19/13    Rx written by Lacretia Nicks, PA.    Letter sent to myChart today, 10/11/14.    Will route request to provider.    Dorise Bullion, RN

## 2014-10-15 ENCOUNTER — Other Ambulatory Visit: Payer: Self-pay | Admitting: Primary Care

## 2014-10-15 ENCOUNTER — Encounter: Payer: Self-pay | Admitting: Primary Care

## 2014-10-15 MED ORDER — IPRATROPIUM-ALBUTEROL 20-100 MCG/ACT IN AERS *I*
1.0000 | INHALATION_SPRAY | Freq: Four times a day (QID) | RESPIRATORY_TRACT | Status: DC
Start: 2014-10-15 — End: 2017-03-07

## 2014-10-15 NOTE — Telephone Encounter (Signed)
Per [employer], the patient would like the Rx sent to the pharmacy in [place], she found a coupon that will help with the cost of the medication.

## 2015-01-03 ENCOUNTER — Other Ambulatory Visit: Payer: Self-pay | Admitting: Primary Care

## 2015-01-03 ENCOUNTER — Encounter: Payer: Self-pay | Admitting: Primary Care

## 2015-01-03 MED ORDER — GABAPENTIN 300 MG PO CAPSULE *I*
900.0000 mg | ORAL_CAPSULE | Freq: Every evening | ORAL | 3 refills | Status: DC
Start: 2015-01-03 — End: 2024-04-02

## 2015-01-03 MED ORDER — VALACYCLOVIR HCL 1000 MG PO TABS *I*
ORAL_TABLET | ORAL | 3 refills | Status: DC
Start: 2015-01-03 — End: 2018-01-20

## 2015-02-10 ENCOUNTER — Encounter: Payer: Self-pay | Admitting: Primary Care

## 2015-02-11 ENCOUNTER — Other Ambulatory Visit: Payer: Self-pay | Admitting: Primary Care

## 2015-02-11 DIAGNOSIS — G43909 Migraine, unspecified, not intractable, without status migrainosus: Secondary | ICD-10-CM

## 2015-02-11 MED ORDER — SUMATRIPTAN SUCCINATE 100 MG PO TABS *I*
100.0000 mg | ORAL_TABLET | ORAL | 5 refills | Status: DC | PRN
Start: 2015-02-11 — End: 2021-02-27

## 2015-05-01 ENCOUNTER — Other Ambulatory Visit: Payer: Self-pay | Admitting: Primary Care

## 2015-11-12 ENCOUNTER — Telehealth: Payer: Self-pay | Admitting: Primary Care

## 2015-11-12 NOTE — Telephone Encounter (Signed)
Patient stated she spoke with JA yesterday and she was informed to call the office today for a consultation appointment.    [phone]

## 2015-11-12 NOTE — Telephone Encounter (Signed)
appt scheduled

## 2015-11-13 ENCOUNTER — Encounter: Payer: Self-pay | Admitting: Primary Care

## 2015-11-13 ENCOUNTER — Ambulatory Visit: Payer: Self-pay | Admitting: Primary Care

## 2015-11-13 VITALS — BP 128/72 | HR 67 | Ht 64.96 in | Wt 112.0 lb

## 2015-11-13 DIAGNOSIS — R5383 Other fatigue: Secondary | ICD-10-CM

## 2015-11-23 ENCOUNTER — Encounter: Payer: Self-pay | Admitting: Primary Care

## 2015-11-23 NOTE — Progress Notes (Signed)
Subjective:      Patient ID: [name] is a 60 y.o. year old female.    Chief Complaint   Patient presents with    Follow-up     Chronic fatigue       HPI:    The patient is a 60 year old woman who comes in today complaining of chronic fatigue.  She is accompanied by her sister who is concerned that she has lost too much weight.  The patient now lives in [place] and has been diagnosed with mold sensitivity.  She reportedly has tested positive for mold sensitivity.  She has been on holistic treatment with herbal medications for over one year.  She is following the [place] protocol.  She is taking different herbal supplements 4 times a day.  She continues to complain of feeling tired.  She has lost 22 pounds over the last 2 years.  She was diagnosed with Hashimoto's thyroiditis and hypothyroidism in [place].  She now is on Synthroid and Cytomel.    She complains of chronic muscle weakness.  Her ankles feel weak.  She has a history of an elevated CK level and of a small fiber sensory neuropathy.  She has seen neurologists in the past with a negative evaluation.    She has good days and bad days with her fatigue.  She has difficulty exercising but she tries to do yoga regularly.    She eats a healthy diet.    She developed a rash in February and was diagnosed with scabies.    She is under chronic stress.    Allergy /Medications/ Problem List/ Social History / :  Allergies   Allergen Reactions    Amoxicillin Hives    Demerol Nausea And Vomiting    Environmental Allergies     Erythromycin      ABDOMINAL PAIN    Gluten Meal      BLOATING, SNEEZING, WATERY EYES, NEUROPATHY FLARE    Sulfa Antibiotics Hives     Current Outpatient Prescriptions on File Prior to Visit   Medication Sig Dispense Refill    albuterol-ipratropium (COMBIVENT RESPIMAT) 20-100 MCG/ACT inhaler Inhale 1 puff into the lungs 4 times daily 3 Inhaler 3    montelukast (SINGULAIR) 10 MG tablet TAKE 1 TABLET BY MOUTH EVERY DAY AT BEDTIME 90  tablet 3    SUMAtriptan (IMITREX) 100 MG tablet Take 1 tablet (100 mg total) by mouth as needed   FOR MIGRAINE RELIEF. MAY REPEAT TWO HOURS LATER. MAXIMUM DAILY DOSE 200MG  PER DAY 9 tablet 5    valACYclovir (VALTREX) 1 GM tablet TAKE 1 TABLET DAILY 90 tablet 3    gabapentin (GABAPENTIN) 300 MG capsule Take 3 capsules (900 mg total) by mouth nightly 270 capsule 3    progesterone (PROMETRIUM) 100 MG capsule Take 1 capsule (100 mg total) by mouth daily 90 capsule 3    estradiol (CLIMARA) 0.0375 MG/24HR patch Place 1 patch onto the skin once a week   Once a week 12 patch 4    liothyronine (CYTOMEL) 5 MCG tablet Take 1 tablet (5 mcg total) by mouth 2 times daily (before meals) (Patient taking differently: Take 5 mcg by mouth daily   ) 180 tablet 3    fluticasone (FLOVENT HFA) 110 MCG/ACT inhaler INHALE 2 PUFFS TWICE DAILY 3 Inhaler 3    cetirizine (ZYRTEC) 10 MG tablet Take 10 mg by mouth daily      mometasone (NASONEX) 50 MCG/ACT nasal spray 2 sprays by Each Nare route daily 17  g 1    Probiotic Product (PROBIOTIC FORMULA PO) Take 1 capsule by mouth daily      DIGESTIVE ENZYMES PO Take 1 capsule by mouth daily      B Complex Vitamins (VITAMIN B COMPLEX) capsule Take 2 capsules by mouth every 12 hours   Neuropathy multivitamin       Cholecalciferol (VITAMIN D) 1000 UNIT tablet TAKE 1 TABLET DAILY. (Patient taking differently: Take 4,000 Units by mouth daily   )  0    Magnesium 200 MG TABS TAKE 1 TABLET DAILY AS DIRECTED.  0     No current facility-administered medications on file prior to visit.      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    Family history of muscular dystrophy Z82.0    Elevated CK R74.8    Fecal incontinence R15.9    Rosacea L71.9    Family history of melanoma Z80.8    Dermatitis L30.9    Fibromyalgia M79.7    Multiple joint pain M25.50    Family history of Wegener's granulomatosis, father Z66.2    Fatigue R53.83    Low back pain M54.5    Candidiasis B37.9     Social History   Substance Use Topics    Smoking status: Former Smoker     Types: Cigarettes     Quit date: 10/07/1980    Smokeless tobacco: Never Used    Alcohol use 3.0 oz/week     5 Glasses of wine per week         Objective:     Recent Lab Results:  Lab Results   Component Value Date    NA 141 03/21/2014    K 4.8 03/21/2014    CL 103 03/21/2014    CO2 26 03/21/2014    UN 10 03/21/2014    CREAT 0.65 03/21/2014    VID25 42 03/21/2014    WBC 7.1 03/21/2014    HGB 14.0 03/21/2014    HCT 42 03/21/2014    PLT 238 03/21/2014    TSH 3.11 03/21/2013    CHOL 215 (!) 03/21/2014    TRIG 66 03/21/2014    HDL 93 03/21/2014    LDLC 109 03/21/2014    [place] 2.3 03/21/2014     Physical Exam:  Vitals:    11/13/15 1619   BP: 128/72   Pulse: 67   Weight: 50.8 kg (112 lb)   Height: 1.65 m (5' 4.96")      Estimated body mass index is 18.66 kg/(m^2) as calculated from the following:    Height as of this encounter: 1.65 m (5' 4.96").    Weight as of this encounter: 50.8 kg (112 lb).     The patient is a thin 60 year old woman who appears tired but in no acute distress.  Respirations are easy.  Lungs are clear.  Cardiac exam shows a regular rhythm without a murmur.  There is no edema.    Assessment / Plan:     The  patient is a 60 year old woman, with fibromyalgia, hypothyroidism, idiopathic small fiber sensory neuropathy and an elevated CK level, who comes in complaining of chronic fatigue.  She is frustrated as she has seen neurologists in the past without any definitive diagnosis.  She states that she has been diagnosed  with mold sensitivity.  She has been undergoing herbal treatment for over one year under the supervision of a physician.  She has not felt significantly better and she has slowly and steadily lost weight over the last 2 years.  I explained to her that I am concerned that her treatment with multiple herbal medications may be doing more harm than good.  She has lost weight and is down to 112 pounds.  I am also concerned that underlying depression and stress are a significant contributor to her symptoms.  I recommended that she see an allergist and possibly a different neurologist in [place] and stop her herbal medications.  She also should get a thorough laboratory evaluation done.  She is going back to [place] within a few days but will call regarding her progress.

## 2016-03-02 ENCOUNTER — Ambulatory Visit: Payer: Self-pay | Admitting: Primary Care

## 2016-03-02 ENCOUNTER — Telehealth: Payer: Self-pay | Admitting: Primary Care

## 2016-03-02 VITALS — BP 110/72 | HR 64 | Temp 98.5°F | Ht 64.96 in | Wt 117.0 lb

## 2016-03-02 DIAGNOSIS — J019 Acute sinusitis, unspecified: Secondary | ICD-10-CM

## 2016-03-02 MED ORDER — DOXYCYCLINE MONOHYDRATE 100 MG PO CAPS *I*
100.0000 mg | ORAL_CAPSULE | Freq: Two times a day (BID) | ORAL | 0 refills | Status: DC
Start: 2016-03-02 — End: 2017-03-07

## 2016-03-02 NOTE — Telephone Encounter (Signed)
Patient has been experiencing a bad cold/cough and is requesting to be seen in office today.     Writer ask patient if she was experiencing any chest pain and patient stated yes, but just when she coughs.     Patient is going on a plane Thursday and is concerned. She does not want to wait until tomorrow to be seen.    Patient would like a call back at [phone]

## 2016-03-02 NOTE — Telephone Encounter (Signed)
appt scheduled

## 2016-03-03 ENCOUNTER — Ambulatory Visit: Payer: Self-pay | Admitting: Primary Care

## 2016-03-13 ENCOUNTER — Encounter: Payer: Self-pay | Admitting: Primary Care

## 2016-03-13 NOTE — Progress Notes (Signed)
Subjective:      Patient ID: [name] is a 60 y.o. year old female.    Chief Complaint   Patient presents with    URI     Symptoms > 1 week       HPI:    The patient is a 60 year old woman, who lives in [place] but is here visiting family, who comes in complaining of URI symptoms for over one week.  She has a cough productive of tan sputum.  She is slightly short of breath and she has slight discomfort in her chest with coughing.  She denies any wheezing but she has been using an albuterol inhaler as needed.  She complains of postnasal drip, nasal congestion, yellow nasal drainage, sore throat and a pressure in her frontal sinuses.  Her left jaw aches.  She denies a fever.  She is concerned as she is flying back to [place] in 2 days.    Allergy /Medications/ Problem List/ Social History / :  Allergies   Allergen Reactions    Amoxicillin Hives    Demerol Nausea And Vomiting    Environmental Allergies     Erythromycin      ABDOMINAL PAIN    Gluten Meal      BLOATING, SNEEZING, WATERY EYES, NEUROPATHY FLARE    Sulfa Antibiotics Hives     Current Outpatient Prescriptions on File Prior to Visit   Medication Sig Dispense Refill    albuterol-ipratropium (COMBIVENT RESPIMAT) 20-100 MCG/ACT inhaler Inhale 1 puff into the lungs 4 times daily 3 Inhaler 3    SYNTHROID 50 MCG tablet Take one tablet a day      BREO ELLIPTA 100-25 MCG/INH inhaler One inhalation a day  2    EVAMIST 1.53 MG/SPRAY transdermal spray INSTILL 1 TO 2 SPRAYS TO FOREARM DAILY.  0    hydrOXYzine HCl (ATARAX) 25 MG tablet Take 50 mg by mouth nightly  3    PROAIR HFA 108 (90 BASE) MCG/ACT inhaler Inhale 2 puffs into the lungs every 4-6 hours as needed  2    montelukast (SINGULAIR) 10 MG tablet TAKE 1 TABLET BY MOUTH EVERY DAY AT BEDTIME 90 tablet 3    SUMAtriptan (IMITREX) 100 MG tablet Take 1 tablet (100 mg total) by mouth as needed   FOR MIGRAINE RELIEF. MAY REPEAT TWO HOURS LATER. MAXIMUM DAILY DOSE 200MG  PER DAY 9 tablet 5     valACYclovir (VALTREX) 1 GM tablet TAKE 1 TABLET DAILY 90 tablet 3    gabapentin (GABAPENTIN) 300 MG capsule Take 3 capsules (900 mg total) by mouth nightly 270 capsule 3    progesterone (PROMETRIUM) 100 MG capsule Take 1 capsule (100 mg total) by mouth daily 90 capsule 3    estradiol (CLIMARA) 0.0375 MG/24HR patch Place 1 patch onto the skin once a week   Once a week 12 patch 4    liothyronine (CYTOMEL) 5 MCG tablet Take 1 tablet (5 mcg total) by mouth 2 times daily (before meals) (Patient taking differently: Take 5 mcg by mouth daily   ) 180 tablet 3    fluticasone (FLOVENT HFA) 110 MCG/ACT inhaler INHALE 2 PUFFS TWICE DAILY 3 Inhaler 3    cetirizine (ZYRTEC) 10 MG tablet Take 10 mg by mouth daily      mometasone (NASONEX) 50 MCG/ACT nasal spray 2 sprays by Each Nare route daily 17 g 1    Probiotic Product (PROBIOTIC FORMULA PO) Take 1 capsule by mouth daily      DIGESTIVE ENZYMES  PO Take 1 capsule by mouth daily      B Complex Vitamins (VITAMIN B COMPLEX) capsule Take 2 capsules by mouth every 12 hours   Neuropathy multivitamin       Cholecalciferol (VITAMIN D) 1000 UNIT tablet TAKE 1 TABLET DAILY. (Patient taking differently: Take 4,000 Units by mouth daily   )  0    Magnesium 200 MG TABS TAKE 1 TABLET DAILY AS DIRECTED.  0     No current facility-administered medications on file prior to visit.      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    Family history of muscular dystrophy Z82.0    Elevated CK R74.8    Fecal incontinence R15.9    Rosacea L71.9    Family  history of melanoma Z80.8    Dermatitis L30.9    Fibromyalgia M79.7    Multiple joint pain M25.50    Family history of Wegener's granulomatosis, father Z86.2    Fatigue R53.83    Low back pain M54.5    Candidiasis B37.9     Social History   Substance Use Topics    Smoking status: Former Smoker     Types: Cigarettes     Quit date: 10/07/1980    Smokeless tobacco: Never Used    Alcohol use 3.0 oz/week     5 Glasses of wine per week         Objective:     Recent Lab Results:  Lab Results   Component Value Date    NA 141 03/21/2014    K 4.8 03/21/2014    CL 103 03/21/2014    CO2 26 03/21/2014    UN 10 03/21/2014    CREAT 0.65 03/21/2014    VID25 42 03/21/2014    WBC 7.1 03/21/2014    HGB 14.0 03/21/2014    HCT 42 03/21/2014    PLT 238 03/21/2014    TSH 3.11 03/21/2013    CHOL 215 (!) 03/21/2014    TRIG 66 03/21/2014    HDL 93 03/21/2014    LDLC 109 03/21/2014    [place] 2.3 03/21/2014     Physical Exam:  Vitals:    03/02/16 1650   BP: 110/72   Pulse: 64   Temp: 36.9 C (98.5 F)   Weight: 53.1 kg (117 lb)   Height: 1.65 m (5' 4.96")      Estimated body mass index is 19.49 kg/(m^2) as calculated from the following:    Height as of this encounter: 1.65 m (5' 4.96").    Weight as of this encounter: 53.1 kg (117 lb).     The patient is a pleasant and healthy-appearing 60 year old woman.  TMs are clear.  There is percussion tenderness over her frontal and maxillary sinuses.  Posterior pharynx is slightly erythematous.  There is no adenopathy of her neck.  Her lungs are clear without wheezes or crackles.  Cardiac exam shows a regular rhythm.    Assessment / Plan:     The  patient is a 60 year old woman who comes in complaining of URI symptoms consistent with acute sinusitis for over one week.  She is not responding to conservative measures.  --Doxycycline 100 mg twice a day for 10 days.  --Fluticasone nasal spray 2 sprays each nostril daily.  --She will call if she is no better.

## 2016-10-14 ENCOUNTER — Encounter: Payer: Self-pay | Admitting: Gastroenterology

## 2017-02-10 ENCOUNTER — Encounter: Payer: Self-pay | Admitting: Gastroenterology

## 2017-02-22 ENCOUNTER — Telehealth: Payer: Self-pay | Admitting: Primary Care

## 2017-02-22 NOTE — Telephone Encounter (Signed)
Patient is calling to request to schedule a visit between 10/4 - 10/12. There are no openings and need to be advised  As to where to schedule this appointment. She will be traveling from [place].  Patient can be reached @ [phone].

## 2017-02-22 NOTE — Telephone Encounter (Signed)
Can you find a time.  12:00 or 12:30 is okay.

## 2017-02-23 NOTE — Telephone Encounter (Signed)
appt scheduled

## 2017-03-07 ENCOUNTER — Ambulatory Visit: Payer: 59 | Attending: Primary Care | Admitting: Primary Care

## 2017-03-07 ENCOUNTER — Encounter: Payer: Self-pay | Admitting: Primary Care

## 2017-03-07 VITALS — BP 120/78 | HR 82 | Ht 65.0 in | Wt 125.2 lb

## 2017-03-07 DIAGNOSIS — K200 Eosinophilic esophagitis: Secondary | ICD-10-CM | POA: Insufficient documentation

## 2017-03-07 DIAGNOSIS — G608 Other hereditary and idiopathic neuropathies: Secondary | ICD-10-CM

## 2017-03-07 DIAGNOSIS — M1380 Other specified arthritis, unspecified site: Secondary | ICD-10-CM | POA: Insufficient documentation

## 2017-03-07 DIAGNOSIS — M1990 Unspecified osteoarthritis, unspecified site: Secondary | ICD-10-CM

## 2017-03-07 DIAGNOSIS — R748 Abnormal levels of other serum enzymes: Secondary | ICD-10-CM

## 2017-03-07 HISTORY — DX: Unspecified osteoarthritis, unspecified site: M19.90

## 2017-03-07 MED ORDER — HYDROCODONE-ACETAMINOPHEN 5-325 MG PO TABS *I*
1.0000 | ORAL_TABLET | Freq: Four times a day (QID) | ORAL | 0 refills | Status: DC | PRN
Start: 2017-03-07 — End: 2022-02-24

## 2017-03-11 ENCOUNTER — Encounter: Payer: Self-pay | Admitting: Gastroenterology

## 2017-03-11 ENCOUNTER — Encounter: Payer: Self-pay | Admitting: Surgery

## 2017-03-11 ENCOUNTER — Ambulatory Visit: Payer: 59 | Attending: Surgery | Admitting: Surgery

## 2017-03-11 VITALS — BP 122/80 | HR 76 | Ht 65.0 in | Wt 126.4 lb

## 2017-03-11 DIAGNOSIS — M797 Fibromyalgia: Secondary | ICD-10-CM

## 2017-03-11 DIAGNOSIS — R5383 Other fatigue: Secondary | ICD-10-CM

## 2017-03-11 DIAGNOSIS — K589 Irritable bowel syndrome without diarrhea: Secondary | ICD-10-CM

## 2017-03-11 DIAGNOSIS — M1990 Unspecified osteoarthritis, unspecified site: Secondary | ICD-10-CM

## 2017-03-11 DIAGNOSIS — Z79890 Hormone replacement therapy (postmenopausal): Secondary | ICD-10-CM

## 2017-03-11 DIAGNOSIS — Z780 Asymptomatic menopausal state: Secondary | ICD-10-CM

## 2017-03-11 DIAGNOSIS — K909 Intestinal malabsorption, unspecified: Secondary | ICD-10-CM

## 2017-03-14 NOTE — Progress Notes (Signed)
GYN ANNUAL EXAM:     [name] is a 61 y.o. G71P1101 postmenopausal female who presents for a Hormone GYN exam. The patient was last seen here on 03/19/2014.  She has been seen by this Probation officer in the past.  She is here visiting family.  She now lives in [place], [place].  She complains of fatigue and palpitations.  She has also been dealing with migraines.   She was seen by a functional medicine doctor ([place]) about 2 1/2 years ago and was diagnosed with toxic mold.  She was on treatment for this for about 1 year.   She tested + for 14/14 molds.  She worked on replacing nutrients to give energy.  She followed the [place] protocol and was on supplements from [place].   She was told by this writer that she had Hashimoto's thyroiditis.  She did test + for this at Dr. Blenda Mounts.   She had been on Cytomel for years.  She did stop it due to palpitations.  She then got severe migraines.  She has slowly added Cytomel back in.  Rasing her Estrogen did not help the migraines.   She has been on Estradiol and Prometrium.  She is following Dr. Warren Lacy Myer's autimmune diet.    She would like to be able to have this writer manage her hormones and her thyroid medications.  She knows that she will need to be seen here once per year.   She was diagnosed in March, 2018, with Inflammatory Arthritis.   She is on Methotrexate for this.    She does get her GYN care and pap smears in [place].  That doctor has her come back on a separate day to discuss hormones.     GYN History  LMP: Postmenopausal   Last pap smear: Date: 08/15/2012 Results: no abnormalities.   The patient is sexually active.   Sexually active: single partner, contraception - post menopausal status  Together with partner: Married   STD History: none  Current contraception: post menopausal status    OB History   Gravida Para Term Preterm AB Living   2 2 1 1  0 1   SAB TAB Ectopic Multiple Live Births   0 0 0 0       # Outcome Date GA Lbr Len/2nd Weight Sex  Delivery Anes PTL Lv   2 Preterm  [redacted]w[redacted]d             Complications: Abruptio Placenta   1 Term               Obstetric Comments   Her child born at 40 weeks died at 58 months old from Tracheal stenosis.       Screening History:  Last mammogram: will send results here   Dexascan: ???  Colonoscopy: up to date  The patient is taking hormone replacement therapy. Patient denies post-menopausal vaginal bleeding.  The patient wears seatbelts:yes  The patient participates in regular exercise: yes  Has the patient ever been transfused or tattooed?:no  Lives with her husbanc.   Domestic violence:No    Past Medical History:   Diagnosis Date    Acne     Actinic keratosis     Allergy history unknown     Anemia     Back pain     Resolved    Complication of anesthesia     Novacaine Dizzy    Depression     Dermatitis 02/09/2012    Dermatitis  Diverticulosis     GERD (gastroesophageal reflux disease)     Gout     Herpes     Genital    IBS (irritable bowel syndrome)     Inflammatory arthritis 03/07/2017    Lumbar stenosis     Migraine     Neuropathy     Recurrent sinus infections     STD (sexually transmitted disease)     Gential Herpes    Urticaria     Varicella         Past Surgical History:   Procedure Laterality Date    CESAREAN SECTION, CLASSIC  1985    ENDOMETRIAL ABLATION  2009    INCONTINENCE SURGERY  2010    NASAL POLYP SURGERY  2003    RECTOCELE REPAIR  2010    SPHINCTEROPLASTY  2010    ABSCESS AFTER SURGERY        Allergies   Allergen Reactions    Amoxicillin Hives    Demerol Nausea And Vomiting    Environmental Allergies     Erythromycin      ABDOMINAL PAIN    Gluten Meal      BLOATING, SNEEZING, WATERY EYES, NEUROPATHY FLARE    Sulfa Antibiotics Hives        Current Outpatient Prescriptions on File Prior to Visit   Medication Sig Dispense Refill    predniSONE (DELTASONE) 5 MG tablet Take 5 mg by mouth daily  1    methotrexate 2.5 MG tablet TAKE 6 TABLETS BY MOUTH ONCE WEEKLY FOR 4  WEEKS  1    tiZANidine (ZANAFLEX) 4 MG tablet Take 4 mg by mouth nightly as needed    AT BEDTIME FOR MUSCLE SPASMS  2    SYNTHROID 50 MCG tablet Take one tablet a day      BREO ELLIPTA 100-25 MCG/INH inhaler One inhalation a day  2    valACYclovir (VALTREX) 1 GM tablet TAKE 1 TABLET DAILY 90 tablet 3    gabapentin (GABAPENTIN) 300 MG capsule Take 3 capsules (900 mg total) by mouth nightly 270 capsule 3    progesterone (PROMETRIUM) 100 MG capsule Take 1 capsule (100 mg total) by mouth daily (Patient taking differently: Take by mouth daily   200mg  Daily) 90 capsule 3    estradiol (CLIMARA) 0.0375 MG/24HR patch Place 1 patch onto the skin once a week   Once a week 12 patch 4    liothyronine (CYTOMEL) 5 MCG tablet Take 1 tablet (5 mcg total) by mouth 2 times daily (before meals) (Patient taking differently: Take 5 mcg by mouth daily   ) 180 tablet 3    cetirizine (ZYRTEC) 10 MG tablet Take 10 mg by mouth daily      Probiotic Product (PROBIOTIC FORMULA PO) Take 1 capsule by mouth daily      DIGESTIVE ENZYMES PO Take 1 capsule by mouth daily      B Complex Vitamins (VITAMIN B COMPLEX) capsule Take 2 capsules by mouth every 12 hours   Neuropathy multivitamin       Cholecalciferol (VITAMIN D) 1000 UNIT tablet TAKE 1 TABLET DAILY. (Patient taking differently: Take 4,000 Units by mouth daily   )  0    Magnesium 200 MG TABS TAKE 1 TABLET DAILY AS DIRECTED.  0    HYDROcodone-acetaminophen (NORCO) 5-325 MG per tablet Take 1 tablet by mouth every 6 hours as needed for Pain   Max daily dose: 4 tablets 28 tablet 0    PROAIR HFA 108 (90  BASE) MCG/ACT inhaler Inhale 2 puffs into the lungs every 4-6 hours as needed  2    SUMAtriptan (IMITREX) 100 MG tablet Take 1 tablet (100 mg total) by mouth as needed   FOR MIGRAINE RELIEF. MAY REPEAT TWO HOURS LATER. MAXIMUM DAILY DOSE 200MG  PER DAY 9 tablet 5    mometasone (NASONEX) 50 MCG/ACT nasal spray 2 sprays by Each Nare route daily 17 g 1     No current  facility-administered medications on file prior to visit.        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    Review of Systems  Pertinent items are noted in HPI.      Objective:      BP 122/80   Pulse 76   Ht 1.651 m (5\' 5" )   Wt 57.3 kg (126 lb 6.4 oz)   BMI 21.03 kg/m2  General appearance: alert, appears stated age, cooperative and no distress  Neck: no adenopathy, supple, symmetrical, trachea midline and thyroid not enlarged, symmetric, no tenderness/mass/nodules  Lungs: clear to auscultation bilaterally  Heart: regular rate and rhythm, S1, S2 normal, no murmur, click, rub or gallop    Assessment:     [name] is a 61 y.o. female who presents for a hormonal GYN exam. She has the following concerns:   1. Postmenopausal state     2. Postmenopausal HRT (hormone replacement therapy)     3. Fatigue     4. IBS (irritable bowel syndrome)     5. Malabsorption     6. Inflammatory arthritis     7. Fibromyalgia                1.  Postmenopausal state  See patient instruction sheets scanned under the media/lab tab.   Labs:  MTHFR Polymorphism  MTHFR issues discussed.   Labs reviewed that patient brought with her.   Get copy of her last mammogram.     2. Postmenopausal HRT (hormone replacement therapy)  Increase Prometrium to 200 mg po daily at bedtime.     3. Fatigue      4. IBS (irritable bowel syndrome)  Leaky gut discussed.   Patient has this.   She is on L-glutamine.  Start taking this at bedtime.     5. Malabsorption      6. Inflammatory arthritis  Increase dose of Methylfolate.  Controversy of Folic acid vs Methylfolate discussed.      7. This was a 45 minute face to face visit and more than 50% of the time was spent in counseling and coordination of care.  Return to care 1 year for yearly follow-up or PRN.    Blenda Mounts, PA  03/14/2017

## 2017-03-21 ENCOUNTER — Encounter: Payer: Self-pay | Admitting: Surgery

## 2017-03-27 ENCOUNTER — Encounter: Payer: Self-pay | Admitting: Primary Care

## 2017-03-27 NOTE — Progress Notes (Signed)
Subjective:      Patient ID: [name] is a 61 y.o. female.    Chief Complaint   Patient presents with    Follow-up     Inflammatory arthritis       HPI:    The patient is a 61 year old woman who comes in today to review her medical history.  She is now living in [place] and is back in the [place] area for a short time to visit family.  She has been diagnosed with rheumatoid arthritis.  She complains of pain in her hips, knees, ankles and occasionally in her wrists and fingers.  She was started on methotrexate 6 tablets a week about 1-1/2 months ago.  She also is on prednisone 5 mg a day.  She also was on [place].  She had difficulty tolerating Plaquenil because of blurred vision.  She also has Achilles tendinitis.  Her joints become swollen during a flare-up of her inflammatory arthritis.  She has generalized stiffness.    She also complains of increasing numbness and tingling with an occasional electric shock feeling down her forearms.  She has a history of an idiopathic small fiber sensory neuropathy.  Her muscles feel weak.  Her CK level has been chronically high.    She complains of feeling tired with a decreased energy level.    She has gained some of the weight that she had lost.    Allergy /Medications/ Problem List/ Social History / :  Allergies   Allergen Reactions    Amoxicillin Hives    Demerol Nausea And Vomiting    Environmental Allergies     Erythromycin      ABDOMINAL PAIN    Gluten Meal      BLOATING, SNEEZING, WATERY EYES, NEUROPATHY FLARE    Sulfa Antibiotics Hives     Current Outpatient Prescriptions on File Prior to Visit   Medication Sig Dispense Refill    SYNTHROID 50 MCG tablet Take one tablet a day      BREO ELLIPTA 100-25 MCG/INH inhaler One inhalation a day  2    valACYclovir (VALTREX) 1 GM tablet TAKE 1 TABLET DAILY 90 tablet 3    gabapentin (GABAPENTIN) 300 MG capsule Take 3 capsules (900 mg total) by mouth nightly 270 capsule 3    progesterone (PROMETRIUM) 100 MG  capsule Take 1 capsule (100 mg total) by mouth daily (Patient taking differently: Take by mouth daily   200mg  Daily) 90 capsule 3    estradiol (CLIMARA) 0.0375 MG/24HR patch Place 1 patch onto the skin once a week   Once a week 12 patch 4    liothyronine (CYTOMEL) 5 MCG tablet Take 1 tablet (5 mcg total) by mouth 2 times daily (before meals) (Patient taking differently: Take 5 mcg by mouth daily   ) 180 tablet 3    cetirizine (ZYRTEC) 10 MG tablet Take 10 mg by mouth daily      Probiotic Product (PROBIOTIC FORMULA PO) Take 1 capsule by mouth daily      DIGESTIVE ENZYMES PO Take 1 capsule by mouth daily      Cholecalciferol (VITAMIN D) 1000 UNIT tablet TAKE 1 TABLET DAILY. (Patient taking differently: Take 4,000 Units by mouth daily   )  0    PROAIR HFA 108 (90 BASE) MCG/ACT inhaler Inhale 2 puffs into the lungs every 4-6 hours as needed  2    SUMAtriptan (IMITREX) 100 MG tablet Take 1 tablet (100 mg total) by mouth as needed   FOR  MIGRAINE RELIEF. MAY REPEAT TWO HOURS LATER. MAXIMUM DAILY DOSE 200MG  PER DAY 9 tablet 5    mometasone (NASONEX) 50 MCG/ACT nasal spray 2 sprays by Each Nare route daily 17 g 1    B Complex Vitamins (VITAMIN B COMPLEX) capsule Take 2 capsules by mouth every 12 hours   Neuropathy multivitamin        No current facility-administered medications on file prior to visit.      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    Family history of muscular dystrophy Z82.0    Elevated CK R74.8    Fecal incontinence R15.9    Rosacea L71.9    Family history of  melanoma Z80.8    Dermatitis L30.9    Fibromyalgia M79.7    Multiple joint pain M25.50    Family history of Wegener's granulomatosis, father Z54.2    Fatigue R53.83    Low back pain M54.5    Candidiasis B37.9    Inflammatory arthritis S50.53    Eosinophilic esophagitis Z76.7     Social History   Substance Use Topics    Smoking status: Former Smoker     Types: Cigarettes     Quit date: 10/07/1980    Smokeless tobacco: Never Used    Alcohol use 3.0 oz/week     5 Glasses of wine per week         Objective:     Recent Lab Results:  Lab Results   Component Value Date    NA 141 03/21/2014    K 4.8 03/21/2014    CL 103 03/21/2014    CO2 26 03/21/2014    UN 10 03/21/2014    CREAT 0.65 03/21/2014    VID25 42 03/21/2014    WBC 7.1 03/21/2014    HGB 14.0 03/21/2014    HCT 42 03/21/2014    PLT 238 03/21/2014    TSH 3.11 03/21/2013    CHOL 215 (!) 03/21/2014    TRIG 66 03/21/2014    HDL 93 03/21/2014    LDLC 109 03/21/2014    [place] 2.3 03/21/2014     Physical Exam:  Vitals:    03/07/17 1237   BP: 120/78   Pulse: 82   Weight: 56.8 kg (125 lb 3.2 oz)   Height: 1.651 m (5\' 5" )      Estimated body mass index is 20.83 kg/(m^2) as calculated from the following:    Height as of this encounter: 1.651 m (5\' 5" ).    Weight as of this encounter: 56.8 kg (125 lb 3.2 oz).     The patient is a pleasant 61 year old woman who appears well.  Her lungs are clear.  Cardiac exam shows a regular rhythm.  There is no swelling or deformity of her joints.    Assessment / Plan:     1.  The patient is a 61 year old woman who recently has been diagnosed with acute rheumatoid arthritis.  She has multiple joint pains with swelling during a flare-up.  She  has been started on methotrexate and she is on a low dose of prednisone.  She has not yet to feel a response to methotrexate.  -She will follow-up with rheumatology down in [place].    2.  Numbness and tingling secondary to an idiopathic small fiber sensory neuropathy.    3.  Chronically elevated  CK level with muscle weakness.  Etiology is unclear.  -Neurology follow-up.    She is to pursue further work-up in [place].

## 2017-03-31 ENCOUNTER — Encounter: Payer: Self-pay | Admitting: Surgery

## 2017-04-12 ENCOUNTER — Encounter: Payer: Self-pay | Admitting: Surgery

## 2017-06-08 ENCOUNTER — Encounter: Payer: Self-pay | Admitting: Gastroenterology

## 2017-07-11 ENCOUNTER — Encounter: Payer: Self-pay | Admitting: Gastroenterology

## 2017-07-22 ENCOUNTER — Encounter: Payer: Self-pay | Admitting: Surgery

## 2017-07-22 NOTE — Telephone Encounter (Signed)
[name] 07/22/2017 10:57 AM EST        ----- Message -----  From: [name]  Sent: 07/22/2017 10:44 AM  To: [facility]  Subject: RE:(No subject)     I'm not sure why the fax didn't go through. I did ask the lab to send you results, but they said it needs to be noted by the ordering physician on the lab slip. Do you want to give me your email address & I can attach the results in an email?  ----- Message -----  From: Blenda Mounts, PA  Sent: 07/22/2017 10:10 AM EST  To: [name]  Subject: (No subject)  I did not get the results. The last labs I have are from 01/2017. Do you have the exact number? Call the lab again and have them fax me the results. Our fax number is [phone]. If you give them permission, I could also have our nurses call them, too, for the results.     Electronically signed by Lacretia Nicks, PA-C 07/22/2017, 10:10 AM

## 2017-07-25 ENCOUNTER — Encounter: Payer: Self-pay | Admitting: Surgery

## 2017-07-26 ENCOUNTER — Encounter: Payer: Self-pay | Admitting: Gastroenterology

## 2017-07-26 ENCOUNTER — Encounter: Payer: Self-pay | Admitting: Surgery

## 2017-07-26 NOTE — Telephone Encounter (Signed)
From: [name]  Sent: 07/25/2017 4:04 PM EST  To: Blenda Mounts, PA  Subject: RE:(No subject)    Ok, I will increase the cytomel. That makes more sense than decreasing at this point! Can you order testosterone cream for me? And do you think my current dose of progesterone is sufficient?  ----- Message -----  From: Blenda Mounts, PA  Sent: 07/25/2017 11:14 AM EST  To: [name]  Subject: (No subject)  HI! I did get your results. I called off sick today and am trying to catch up on messages. I agree with the increase in your Estradiol patch. I don't want you to do any different with the T3 except possible increasing your dose on that. I think you are taking only 5 mcg daily, aren't you? I think if this is what you are doing, I would like you to take 1/2 tab (2.5 mcg) before supper on an empty stomach in addition to the am 5 mcg dose.   I want you to consider adding Testosterone cream. Your Testosterone levels are low. This can help with energy.     Electronically signed by Lacretia Nicks, PA-C 07/25/2017, 11:14 AM

## 2017-07-27 ENCOUNTER — Encounter: Payer: Self-pay | Admitting: Surgery

## 2017-07-27 DIAGNOSIS — N951 Menopausal and female climacteric states: Secondary | ICD-10-CM

## 2017-07-27 DIAGNOSIS — M6281 Muscle weakness (generalized): Secondary | ICD-10-CM

## 2017-07-27 DIAGNOSIS — E349 Endocrine disorder, unspecified: Secondary | ICD-10-CM

## 2017-07-27 DIAGNOSIS — Z79890 Hormone replacement therapy (postmenopausal): Secondary | ICD-10-CM

## 2017-07-27 DIAGNOSIS — R5383 Other fatigue: Secondary | ICD-10-CM

## 2017-07-27 DIAGNOSIS — Z780 Asymptomatic menopausal state: Secondary | ICD-10-CM

## 2017-07-27 MED ORDER — NON-SYSTEM MEDICATION *A*
0 refills | Status: DC
Start: 2017-07-27 — End: 2017-11-16

## 2017-07-27 NOTE — Telephone Encounter (Signed)
[name], LPN 7/78/2423 5:36 PM EST        ----- Message -----  From: [name]  Sent: 07/26/2017 1:21 PM  To: [facility]  Subject: RE:(No subject)     Yes, the pharmacy in [place] would be fine. Thanks [place]! I hope you are feeling better!  ----- Message -----  From: Blenda Mounts, PA  Sent: 07/26/2017 12:54 PM EST  To: [name]  Subject: (No subject)  I do want you to stay on the current Progesterone dose. Do you want me to do the Testosterone cream Rx through one of the cheaper and good compounding pharmacies? [facility] in [place], [place]. If so, they will not fill the Rx until they get a hard copy of the Rx as it is a controlled substance.   I will mail some thyroid reqs to you so that you can get your labs tested for me again.     Electronically signed by Lacretia Nicks, PA-C 07/26/2017, 12:54 PM

## 2017-07-27 NOTE — Telephone Encounter (Signed)
Testosterone Rx faxed and hard copy of Rx mailed to [facility] in [place].     I-stop accessed-no issues.

## 2017-08-04 ENCOUNTER — Encounter: Payer: Self-pay | Admitting: Surgery

## 2017-08-09 ENCOUNTER — Telehealth: Payer: Self-pay

## 2017-08-09 NOTE — Telephone Encounter (Signed)
Phone call received from [facility]. Verbal order given to change MDD to 1 MG (1/4 GM) and gave contact number for the patient. Pharmacist brings no other questions.    Wannetta Sender, RN

## 2017-08-14 ENCOUNTER — Encounter: Payer: Self-pay | Admitting: Surgery

## 2017-08-14 DIAGNOSIS — Z1589 Genetic susceptibility to other disease: Secondary | ICD-10-CM | POA: Insufficient documentation

## 2017-09-30 ENCOUNTER — Encounter: Payer: Self-pay | Admitting: Surgery

## 2017-10-12 ENCOUNTER — Telehealth: Payer: Self-pay | Admitting: Gastroenterology

## 2017-10-12 ENCOUNTER — Other Ambulatory Visit: Payer: Self-pay

## 2017-10-12 NOTE — Telephone Encounter (Signed)
lmor for pt to call back

## 2017-10-14 NOTE — Telephone Encounter (Signed)
Patient called back.  There was a refill sent in from our office. The Patient thought perhaps that's what you were calling about. Patient said you know she has another Tax adviser and has moved out of the area.    [name] [phone]

## 2017-10-16 ENCOUNTER — Other Ambulatory Visit: Payer: Self-pay | Admitting: Primary Care

## 2017-10-19 ENCOUNTER — Encounter: Payer: Self-pay | Admitting: Surgery

## 2017-10-19 DIAGNOSIS — R5383 Other fatigue: Secondary | ICD-10-CM

## 2017-10-19 DIAGNOSIS — E038 Other specified hypothyroidism: Secondary | ICD-10-CM

## 2017-10-20 MED ORDER — SYNTHROID 50 MCG PO TABS
50.0000 ug | ORAL_TABLET | Freq: Every day | ORAL | 1 refills | Status: DC
Start: 2017-10-20 — End: 2017-10-21

## 2017-10-20 MED ORDER — LIOTHYRONINE SODIUM 5 MCG PO TABS *I*
ORAL_TABLET | ORAL | 1 refills | Status: DC
Start: 2017-10-20 — End: 2017-11-18

## 2017-10-21 ENCOUNTER — Encounter: Payer: Self-pay | Admitting: Surgery

## 2017-10-21 MED ORDER — LEVOTHYROXINE SODIUM 50 MCG PO TABS *I*
50.0000 ug | ORAL_TABLET | Freq: Every day | ORAL | 1 refills | Status: DC
Start: 2017-10-21 — End: 2018-04-26

## 2017-10-21 NOTE — Telephone Encounter (Signed)
[name], LPN 6/33/3545 62:56 AM EDT        ----- Message -----  From: [name]  Sent: 10/21/2017 10:46 AM  To: [facility]  Subject: RE:(No subject)     Hi Rindi Beechy, I'm sorry to bother you again. I have been taking Synthroid for quite some time at less than $20.00 for 3 month supply, but it has suddenly skyrocketed to 99.00!! Would you mind sending Levoxyl instead? It is only $12.00 for 3 months. Thank you!!  ----- Message -----  From: Blenda Mounts, PA  Sent: 10/20/2017 11:58 AM EDT  To: [name]  Subject: (No subject)  HI! I renewed your Rx's.     Electronically signed by Lacretia Nicks, PA-C 10/20/2017, 11:58 AM

## 2017-10-26 ENCOUNTER — Encounter: Payer: Self-pay | Admitting: Gastroenterology

## 2017-11-01 ENCOUNTER — Telehealth: Payer: Self-pay | Admitting: Surgery

## 2017-11-01 NOTE — Telephone Encounter (Signed)
Detailed myChart message sent to patient.    Lutricia Widjaja G Sanae Willetts, PA

## 2017-11-02 ENCOUNTER — Encounter: Payer: Self-pay | Admitting: Surgery

## 2017-11-02 NOTE — Telephone Encounter (Signed)
[name], LPN 07/08/7865 6:72 AM EDT        ----- Message -----  From: [name]  Sent: 11/01/2017 5:23 PM  To: [facility]  Subject: RE:(No subject)     Hi [name], I have been taking 5 in the morning and 2.5 in the afternoon. I do get occasional palpitations, maybe 3-4 times per week.  ----- Message -----  From: Blenda Mounts, PA  Sent: 11/01/2017 4:39 PM EDT  To: [name]  Subject: (No subject)  HI! I did get your labs from 10/26/2017. Your Free T3 level is low at 2.3. How are you taking the T3. Are you doing 10 in the morning and 5 in the afternoon? If so and if you are not having any palpitations, I would like you to increase this to 10 mcg twice daily.     Electronically signed by Lacretia Nicks, PA-C 11/01/2017, 4:39 PM

## 2017-11-02 NOTE — Telephone Encounter (Signed)
[name], [place] 09/30/7480 70:78 AM EDT        ----- Message -----  From: [name]  Sent: 11/02/2017 10:35 AM  To: [facility]  Subject: RE:(No subject)     No. I have taken selenium in the past but not recently  ----- Message -----  From: Blenda Mounts, PA  Sent: 11/02/2017 10:31 AM EDT  To: [name]  Subject: (No subject)  Do you take Selenium or eat any [place] nuts?    Electronically signed by Lacretia Nicks, PA-C 11/02/2017, 10:31 AM

## 2017-11-15 ENCOUNTER — Encounter: Payer: Self-pay | Admitting: Surgery

## 2017-11-15 DIAGNOSIS — M6281 Muscle weakness (generalized): Secondary | ICD-10-CM

## 2017-11-15 DIAGNOSIS — Z780 Asymptomatic menopausal state: Secondary | ICD-10-CM

## 2017-11-15 DIAGNOSIS — R5383 Other fatigue: Secondary | ICD-10-CM

## 2017-11-15 DIAGNOSIS — E349 Endocrine disorder, unspecified: Secondary | ICD-10-CM

## 2017-11-15 DIAGNOSIS — N951 Menopausal and female climacteric states: Secondary | ICD-10-CM

## 2017-11-15 DIAGNOSIS — Z79890 Hormone replacement therapy (postmenopausal): Secondary | ICD-10-CM

## 2017-11-16 MED ORDER — NON-SYSTEM MEDICATION *A*
0 refills | Status: DC
Start: 2017-11-16 — End: 2018-03-01

## 2017-11-16 NOTE — Telephone Encounter (Signed)
Testosterone Rx filled and faxed to [facility] in [place], [place].     Hard copy of Rx mailed.     I-stop accessed-no issues.

## 2017-11-18 DIAGNOSIS — E038 Other specified hypothyroidism: Secondary | ICD-10-CM

## 2017-11-18 DIAGNOSIS — R5383 Other fatigue: Secondary | ICD-10-CM

## 2017-11-18 DIAGNOSIS — E039 Hypothyroidism, unspecified: Secondary | ICD-10-CM

## 2017-11-18 MED ORDER — LIOTHYRONINE SODIUM 5 MCG PO TABS *I*
ORAL_TABLET | ORAL | 1 refills | Status: DC
Start: 2017-11-18 — End: 2018-05-16

## 2017-11-22 ENCOUNTER — Telehealth: Payer: Self-pay

## 2017-11-22 NOTE — Telephone Encounter (Signed)
Phone call received from Dominica at [facility]. She is calling to clarify MDD on Testosterone Rx. Writer gave verbal order to change MDD to 1/4 GM. No further questions when asked.    Wannetta Sender, RN

## 2018-01-11 ENCOUNTER — Encounter: Payer: Self-pay | Admitting: Gastroenterology

## 2018-01-20 ENCOUNTER — Encounter: Payer: Self-pay | Admitting: Surgery

## 2018-01-20 ENCOUNTER — Ambulatory Visit: Payer: 59 | Attending: Surgery | Admitting: Surgery

## 2018-01-20 VITALS — BP 100/70 | HR 78 | Temp 98.3°F | Ht 65.0 in | Wt 129.2 lb

## 2018-01-20 DIAGNOSIS — Z01419 Encounter for gynecological examination (general) (routine) without abnormal findings: Secondary | ICD-10-CM

## 2018-01-20 DIAGNOSIS — Z79890 Hormone replacement therapy (postmenopausal): Secondary | ICD-10-CM

## 2018-01-20 DIAGNOSIS — Z780 Asymptomatic menopausal state: Secondary | ICD-10-CM | POA: Insufficient documentation

## 2018-01-20 DIAGNOSIS — M8580 Other specified disorders of bone density and structure, unspecified site: Secondary | ICD-10-CM

## 2018-01-20 DIAGNOSIS — E039 Hypothyroidism, unspecified: Secondary | ICD-10-CM | POA: Insufficient documentation

## 2018-01-20 DIAGNOSIS — E7212 Methylenetetrahydrofolate reductase deficiency: Secondary | ICD-10-CM | POA: Insufficient documentation

## 2018-01-20 DIAGNOSIS — B009 Herpesviral infection, unspecified: Secondary | ICD-10-CM | POA: Insufficient documentation

## 2018-01-20 DIAGNOSIS — Z1589 Genetic susceptibility to other disease: Secondary | ICD-10-CM

## 2018-01-20 DIAGNOSIS — N951 Menopausal and female climacteric states: Secondary | ICD-10-CM | POA: Insufficient documentation

## 2018-01-20 MED ORDER — VALACYCLOVIR HCL 1000 MG PO TABS *I*
ORAL_TABLET | ORAL | 4 refills | Status: DC
Start: 2018-01-20 — End: 2019-01-29

## 2018-01-20 NOTE — Progress Notes (Signed)
GYN ANNUAL EXAM:     [name] is a 62 y.o. G50P1101 postmenopausal female who presents for an annual GYN exam. The patient currently lives in [place] and is here for 2 weeks visiting family.  She has been having some sweats and feels "shaky" sometimes.    She has seen both Rheumatology in [place] as well as at the [facility].  They aren't sure if the patient has RA or not.  She has been diagnosed with Polymyositis so far and probably Fibromyalgia.  She is currently on Prednisone, and her pain symptom areas have improved.   She would like a refill of her Valtrex that she takes daily.   She also needs a refill on her vaginal Estrogen that she has been getting at a compounding pharmacy in [place], but she would like to get it from [place], [place] where she gets her other compounded Testosterone from.     GYN History  LMP: had an ablation   LMP probably around age 72.   Last pap smear: Date: 2 years ago (09/2015) Results: no abnormalities-patient unable to find results in her lab portal from [place]  History of abnormal pap smear: No.   The patient is sexually active.   Sexually active: single partner, contraception - post menopausal status  Together with partner: married   STD History: none  Current contraception: post menopausal status    OB History   Gravida Para Term Preterm AB Living   2 2 1 1  0 1   SAB TAB Ectopic Multiple Live Births   0 0 0 0        # Outcome Date GA Lbr Len/2nd Weight Sex Delivery Anes PTL Lv   2 Preterm  [redacted]w[redacted]d             Complications: Abruptio Placenta   1 Term               Obstetric Comments   Her child born at 23 weeks died at 59 months old from Tracheal stenosis.       Screening History:  Last mammogram: approximate date 10/31/2017 and was normal   Dexascan: 03/16/2012   Osteopenia  Colonoscopy: last year-hx of diverticulosis  The patient is taking hormone replacement therapy. Patient denies post-menopausal vaginal bleeding.  The patient wears seatbelts:yes  The patient participates in  regular exercise: yes  Has the patient ever been transfused or tattooed?:no  Lives with her husband.   Domestic violence:No    Past Medical History:   Diagnosis Date    Acne     Actinic keratosis     Allergy history unknown     Anemia     Back pain     Resolved    Complication of anesthesia     Novacaine Dizzy    Depression     Dermatitis 02/09/2012    Dermatitis     Diverticulosis     GERD (gastroesophageal reflux disease)     Gout     Herpes     Genital    IBS (irritable bowel syndrome)     Inflammatory arthritis 03/07/2017    Lumbar stenosis     Migraine     Neuropathy     Recurrent sinus infections     STD (sexually transmitted disease)     Gential Herpes    Urticaria     Varicella         Past Surgical History:   Procedure Laterality Date    CESAREAN SECTION,  CLASSIC  1985    ENDOMETRIAL ABLATION  2009    INCONTINENCE SURGERY  2010    NASAL POLYP SURGERY  2003    RECTOCELE REPAIR  2010    SPHINCTEROPLASTY  2010    ABSCESS AFTER SURGERY        Allergies   Allergen Reactions    Amoxicillin Hives    Demerol Nausea And Vomiting    Environmental Allergies     Erythromycin      ABDOMINAL PAIN    Gluten Meal      BLOATING, SNEEZING, WATERY EYES, NEUROPATHY FLARE    Sulfa Antibiotics Hives    Plaquenil [Hydroxychloroquine] Other (See Comments)     Blurred vision        Current Outpatient Prescriptions on File Prior to Visit   Medication Sig Dispense Refill    liothyronine (CYTOMEL) 5 MCG tablet 1 tab (5 mcg) po BID, one in the am and one before supper 180 tablet 1    Non-System Medication Medication/Supply: Testosterone Cream 4 mg/gm [place] system  Directions for Use: apply 1/4 gm (1 mg) daily in the am  Code F   MDD: 1 gm 22.5 each 0    levothyroxine (SYNTHROID, LEVOTHROID) 50 MCG tablet Take 1 tablet (50 mcg total) by mouth daily (before breakfast) 90 tablet 1    predniSONE (DELTASONE) 5 MG tablet Take 5 mg by mouth daily  1    methotrexate 2.5 MG tablet TAKE 6 TABLETS BY  MOUTH ONCE WEEKLY FOR 4 WEEKS  1    tiZANidine (ZANAFLEX) 4 MG tablet Take 4 mg by mouth nightly as needed    AT BEDTIME FOR MUSCLE SPASMS  2    gabapentin (GABAPENTIN) 300 MG capsule Take 3 capsules (900 mg total) by mouth nightly 270 capsule 3    progesterone (PROMETRIUM) 100 MG capsule Take 1 capsule (100 mg total) by mouth daily (Patient taking differently: Take by mouth daily   200mg  Daily) 90 capsule 3    estradiol (CLIMARA) 0.0375 MG/24HR patch Place 1 patch onto the skin once a week   Once a week 12 patch 4    cetirizine (ZYRTEC) 10 MG tablet Take 10 mg by mouth daily      Probiotic Product (PROBIOTIC FORMULA PO) Take 1 capsule by mouth daily      DIGESTIVE ENZYMES PO Take 1 capsule by mouth daily      B Complex Vitamins (VITAMIN B COMPLEX) capsule Take 2 capsules by mouth every 12 hours   Neuropathy multivitamin       Cholecalciferol (VITAMIN D) 1000 UNIT tablet TAKE 1 TABLET DAILY. (Patient taking differently: Take 4,000 Units by mouth daily   )  0    Butalbital-APAP-Caffeine 50-300-40 MG CAPS Take 1 capsule by mouth every 6 hours as needed  0    omeprazole (PRILOSEC) 40 MG capsule Take 40 mg by mouth daily   BEFORE A MEAL  1    HYDROcodone-acetaminophen (NORCO) 5-325 MG per tablet Take 1 tablet by mouth every 6 hours as needed for Pain   Max daily dose: 4 tablets 28 tablet 0    BREO ELLIPTA 100-25 MCG/INH inhaler One inhalation a day  2    PROAIR HFA 108 (90 BASE) MCG/ACT inhaler Inhale 2 puffs into the lungs every 4-6 hours as needed  2    SUMAtriptan (IMITREX) 100 MG tablet Take 1 tablet (100 mg total) by mouth as needed   FOR MIGRAINE RELIEF. MAY REPEAT TWO HOURS LATER. MAXIMUM DAILY  DOSE 200MG  PER DAY 9 tablet 5    mometasone (NASONEX) 50 MCG/ACT nasal spray 2 sprays by Each Nare route daily 17 g 1     No current facility-administered medications on file prior to visit.        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    Joint Pain, Localized In The Knee M25.569    Allergic Rhinitis J30.9    Irritable bowel syndrome K58.9    Gastroparesis K31.84    Migraine Headache G43.909    Tingling (Paresthesia) R20.9    Epigastric pain R10.13    Idiopathic small fiber sensory neuropathy G60.8    Family history of muscular dystrophy Z82.0    Elevated CK R74.8    Fecal incontinence R15.9    Rosacea L71.9    Family history of melanoma Z80.8    Dermatitis L30.9    Fibromyalgia M79.7    Multiple joint pain M25.50    Family history of Wegener's granulomatosis, father Z21.2    Fatigue R53.83    Low back pain M54.5    Candidiasis B37.9    Inflammatory arthritis D53.29    Eosinophilic esophagitis J24.2    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    Review of Systems  Pertinent items are noted in HPI.       Objective:      BP 100/70    Pulse 78    Temp 36.8 C (98.3 F)    Ht 1.651 m (5\' 5" )    Wt 58.6 kg (129 lb 3.2 oz)    BMI 21.50 kg/m   General appearance: alert, appears stated age, cooperative and thin  Neck: no adenopathy, supple, symmetrical, trachea midline and thyroid not enlarged, symmetric, no tenderness/mass/nodules  Lungs: clear to auscultation bilaterally  Breasts: normal appearance, no masses or tenderness  Heart: regular rate and rhythm, S1, S2 normal, no murmur, click, rub or gallop  Abdomen: soft, non-tender; bowel sounds normal; no masses,  no organomegaly  Pelvic: cervix normal in appearance, external genitalia normal, no adnexal masses or tenderness, no cervical motion tenderness, positive findings: vaginal pale, slightly atrophic, rectovaginal septum normal and uterus normal size, shape, and consistency  Extremities: extremities normal, atraumatic, no cyanosis or  edema  Skin: Skin color, texture, turgor normal. No rashes or lesions  Lymph nodes: Cervical, supraclavicular, and axillary nodes normal.      Assessment:     Carri Spillers is a 62 y.o. female who presents for an annual GYN exam. She has the following concerns:   1. Well woman exam with routine gynecological exam  HM MAMMOGRAPHY    GYN Cytology   2. Hypothyroidism  TSH    T3    T3, free    T4, free   3. Postmenopausal state  HM MAMMOGRAPHY    DEXA Scan    TSH    T3    T3, free    T4, free    Estradiol    Testosterone, free, total    Progesterone    GYN Cytology   4. 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 Osteopenia  DEXA Scan   7. 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 Well woman exam with routine gynecological exam    - HM MAMMOGRAPHY; Future  - GYN Cytology; Future    2. Hypothyroidism  Recent labs discussed.  Decrease Cytomel to 1/2 tab po BID.   - TSH; Standing  - T3; Standing  - T3, free; Standing  - T4, free; Standing    3. Postmenopausal state  Recent labs discussed.  Increase Testosterone dose for now to 2 clicks daily that will be a  2 mg dose and will redo her Rx.  She is also to send me the information about her compounded vaginal Rx so that I can fill it, too, through [facility] in [place]; Future  - DEXA Scan; Future  - TSH; Standing  - T3; Standing  - T3, free; Standing  - T4, free; Standing  - Estradiol; Standing  - Testosterone, free, total; Standing  - Progesterone; Standing  - GYN Cytology; Future    4. 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 Osteopenia    - DEXA Scan;  Future    7. 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    Blenda Mounts, PA  01/20/2018

## 2018-01-30 LAB — GYN CYTOLOGY

## 2018-02-10 ENCOUNTER — Encounter: Payer: Self-pay | Admitting: Surgery

## 2018-02-12 DIAGNOSIS — G7102 Facioscapulohumeral muscular dystrophy: Secondary | ICD-10-CM | POA: Insufficient documentation

## 2018-02-14 ENCOUNTER — Encounter: Payer: Self-pay | Admitting: Surgery

## 2018-02-14 NOTE — Telephone Encounter (Signed)
[name], [place] 0/27/2536 6:44 PM EDT        ----- Message -----  From: [name]  Sent: 02/13/2018 4:14 PM  To: [facility]  Subject: RE:(No subject)     It doesn't say on the label, but it looks like the tube contains 60 gm. The order is for one gram twice a week. I thought you said you would send it to [facility].   I forgot to mention in my original message that I am still getting hot flashes. Is it too soon to expect improvement, after increasing my testosterone cream on 8/24?  ----- Message -----  From: Blenda Mounts, PA  Sent: 02/10/2018 12:55 PM EDT  To: [name]  Subject: (No subject)  Could you tell me the amount of cream that is given in each Rx? What pharmacy does this go to? How do the instructions read for you to use it? Does it say 1 ounce per dose?    I would like you to keep doing the Cytomel just the way you are doing it now. Isn't that odd about the migraines?    Electronically signed by Lacretia Nicks, PA-C 02/10/2018, 12:55 PM

## 2018-02-24 ENCOUNTER — Encounter: Payer: Self-pay | Admitting: Surgery

## 2018-02-24 DIAGNOSIS — R5383 Other fatigue: Secondary | ICD-10-CM

## 2018-02-24 DIAGNOSIS — M6281 Muscle weakness (generalized): Secondary | ICD-10-CM

## 2018-02-24 DIAGNOSIS — Z79890 Hormone replacement therapy (postmenopausal): Secondary | ICD-10-CM

## 2018-02-24 DIAGNOSIS — N951 Menopausal and female climacteric states: Secondary | ICD-10-CM

## 2018-02-24 DIAGNOSIS — Z780 Asymptomatic menopausal state: Secondary | ICD-10-CM

## 2018-02-24 DIAGNOSIS — E349 Endocrine disorder, unspecified: Secondary | ICD-10-CM

## 2018-02-28 ENCOUNTER — Encounter: Payer: Self-pay | Admitting: Surgery

## 2018-02-28 NOTE — Telephone Encounter (Signed)
Received phone call from the pt. Pt states she needs refill of testosterone cream. Pt states she also would like her estradiol patch increased to 0.06, she has been taking 0.05. Will send to provider in request for this.    Last AGY 01/20/2018  [facility]    Eula Fried, RN

## 2018-03-01 ENCOUNTER — Telehealth: Payer: Self-pay | Admitting: Surgery

## 2018-03-01 MED ORDER — NON-SYSTEM MEDICATION *A*
0 refills | Status: DC
Start: 2018-03-01 — End: 2018-03-02

## 2018-03-01 MED ORDER — ESTRADIOL 0.06 MG/24HR TD PTWK *A*
1.0000 | MEDICATED_PATCH | TRANSDERMAL | 3 refills | Status: DC
Start: 2018-03-01 — End: 2018-03-02

## 2018-03-01 NOTE — Telephone Encounter (Signed)
Phone call to [facility].  Message left for them to call back.  I need to know exactly how to write this patient's vaginal hormones that she has been having filled with them so that I can reorder it.

## 2018-03-02 ENCOUNTER — Other Ambulatory Visit: Payer: Self-pay

## 2018-03-02 DIAGNOSIS — Z780 Asymptomatic menopausal state: Secondary | ICD-10-CM

## 2018-03-02 DIAGNOSIS — E349 Endocrine disorder, unspecified: Secondary | ICD-10-CM

## 2018-03-02 DIAGNOSIS — N951 Menopausal and female climacteric states: Secondary | ICD-10-CM

## 2018-03-02 DIAGNOSIS — Z79890 Hormone replacement therapy (postmenopausal): Secondary | ICD-10-CM

## 2018-03-02 MED ORDER — ESTRADIOL 0.06 MG/24HR TD PTWK *A*
1.0000 | MEDICATED_PATCH | TRANSDERMAL | 3 refills | Status: DC
Start: 2018-03-02 — End: 2019-01-29

## 2018-03-02 MED ORDER — NON-SYSTEM MEDICATION *A*
0 refills | Status: DC
Start: 2018-03-02 — End: 2018-07-13

## 2018-03-02 NOTE — Telephone Encounter (Signed)
Patient called the office requesting [facility] [place].      She also inquires if she is supposed to use 2 clicks of Testosterone.     Will route to the provider.    Wannetta Sender, RN

## 2018-03-02 NOTE — Telephone Encounter (Signed)
I had filled her Testosterone Rx but realized with her call that I had increased her to a 2 mg dose.  I redid the Rx to reflect this but in a smaller amount of cream.   Rx faxed and mailed to [facility].     I-stop accessed-no issues.

## 2018-03-03 NOTE — Telephone Encounter (Addendum)
Phone call received from [facility]. Patient's hormone prescription reads as follows:    E2 5 MG/ounce  Testosterone 60 MG/ ounce  The complete Rx is in a 2 ounce tube; 60 G total    Will route to the provider.    Wannetta Sender, RN

## 2018-04-02 ENCOUNTER — Encounter: Payer: Self-pay | Admitting: Surgery

## 2018-04-02 DIAGNOSIS — Z79890 Hormone replacement therapy (postmenopausal): Secondary | ICD-10-CM

## 2018-04-02 DIAGNOSIS — Z780 Asymptomatic menopausal state: Secondary | ICD-10-CM

## 2018-04-02 DIAGNOSIS — N951 Menopausal and female climacteric states: Secondary | ICD-10-CM

## 2018-04-02 DIAGNOSIS — N952 Postmenopausal atrophic vaginitis: Secondary | ICD-10-CM

## 2018-04-06 NOTE — Telephone Encounter (Signed)
Incoming call from the patient. She is inquiring about the status of her prescription from [facility] that is to be sent to [facility].    Will route to the provider.    Wannetta Sender, RN

## 2018-04-17 NOTE — Telephone Encounter (Signed)
Received call from the pt inquiring about her prescription for vaginal cream (E2 5 MG/ounce  Testosterone 60 MG/ ounce  The complete Rx is in a 2 ounce tube; 60 G total). She requests the prescription be sent to [facility]. Pt states she has yet to hear from anyone regarding this. She states she has been contacting the office since early October with no response. Pt has been out of the medication for a while. Will send another message to provider regarding this.    Eula Fried, RN

## 2018-04-18 NOTE — Telephone Encounter (Signed)
Telephoned pt. Update provided on her refill request. Pt appreciative.    Eula Fried, RN

## 2018-04-21 NOTE — Telephone Encounter (Signed)
Received call from the pt. Pt is calling wondering if the testosterone had been filled. She was expecting a phone call regarding this. Pt states she went to the lab today to get her labs done and the lab could not do it as there was no order. She said [facility] wanted her to go every 2 months to have this done. Pt started crying on the phone. She states she has not had a response from the provider and has been out of her vaginal cream for a long time. She states she feels off. She reports transferring to [facility] because the office she was going to never answered her calls. Pt appreciative for all the care here at our office and is considering transferring to another office.     While on phone, pt requests refill of levothyroxine.    Eula Fried, RN

## 2018-04-21 NOTE — Telephone Encounter (Signed)
Lab requisitions faxed to email.    Eula Fried, RN

## 2018-04-21 NOTE — Telephone Encounter (Signed)
Outgoing call placed to the pt. Reviewed writer could fax the requisitions to her. Pt provides email of [email or web address]. Will fax requisitions to the pt at email provided.    Also, reviewed with pt once we receive results if she would feel comfortable with another provider prescribing her medication for her. Pt is agreeable with this.    Eula Fried, RN

## 2018-04-21 NOTE — Telephone Encounter (Signed)
Telephoned pt. Pt confirms that she did receive the faxes. She will go to the lab to have these done. Office fax number provided to the pt to fax results to the office.     Eula Fried, RN

## 2018-04-24 ENCOUNTER — Telehealth: Payer: Self-pay

## 2018-04-24 MED ORDER — NON-SYSTEM MEDICATION *A*
0 refills | Status: DC
Start: 2018-04-24 — End: 2018-10-18

## 2018-04-24 NOTE — Telephone Encounter (Signed)
Outgoing call to the patient. No answer and voicemail is not set up.    Will send a MyChart message to the patient with information from Pincus Sanes, NP.    Wannetta Sender, RN

## 2018-04-24 NOTE — Telephone Encounter (Signed)
S: [name] is a 62 y.o. G2P1101 who was last seen for annual exam with Lacretia Nicks PA in August 2019. She is calling requesting an aerobic urine culture for UTI symptoms. However, the patient is currently residing in [place].    O: N/A, pt not able to access office    A: Urinary urgency & frequency    P: Pt should be evaluated in person prior to aerobic culture. If pt does not have PCP in [place], pt should go to an urgent care center for evaluation and management of her symptoms.    Routed to nurse pool to notify patient.    Pincus Sanes, DNP, FNP-BC  Nurse Practitioner  [facility]

## 2018-04-24 NOTE — Telephone Encounter (Signed)
Vaginal HRT RX faxed and hard copy of Rx mailed to [facility] in [place].     I-stop accessed.  No issues.     Spoke with pharmacist at [facility] about this patient's vaginal HRT Rx from [facility] in [place].  An ounce of cream is approximately 28 grams.  Her Rx dose was probably about 0.15 mg/gm of Estradiol and 2 mg/gm of Testosterone.  New Rx done.     MyChart message sent to patient.

## 2018-04-24 NOTE — Telephone Encounter (Signed)
Incoming call from the patient. She cannot see the order names on the lab requisitions. Writer will re-print requisitions and email to the patient. She expresses understanding.    Wannetta Sender, RN

## 2018-04-24 NOTE — Telephone Encounter (Signed)
Incoming call from the patient. She reports urinary frequency and urinary odor for the last 2 weeks. She denies dysuria. She has chronic back pain unrelated to UTIs. The patient denies any fevers. She did feel the chills yesterday briefly and took her temperature- she was afebrile. The patient reports eating and drinking well. She lives in [place] now and does not have a PCP. She is wondering if an aerobic culture can be ordered for her to have done at a lab in [place].     Will consult with the providers.    Wannetta Sender, RN

## 2018-04-25 ENCOUNTER — Other Ambulatory Visit: Payer: Self-pay | Admitting: Surgery

## 2018-04-25 ENCOUNTER — Encounter: Payer: Self-pay | Admitting: Gastroenterology

## 2018-05-01 ENCOUNTER — Encounter: Payer: Self-pay | Admitting: Surgery

## 2018-05-03 ENCOUNTER — Encounter: Payer: Self-pay | Admitting: Surgery

## 2018-05-03 NOTE — Telephone Encounter (Signed)
[name], LPN 82/09/537 7:67 PM EST      ----- Message -----  From: [name]  Sent: 05/03/2018 1:20 PM EST  To: [facility]  Subject: RE:(No subject)     I will check with my prescription plan about the armour thyroid. Do you mean dissolve cytomel on my tongue?  ----- Message -----  From: Blenda Mounts, PA   Sent: 05/03/2018 1:17 PM EST    To: [name]   Subject: (No subject)   Hi! I finally got your lab results. I wish you could take the T3 (Cytomel-Liothyronine). The labs show that you need it. Would your insurance ever cover Armour Thyroid? I have found that patients tolerate this better and it has T3 in it. I would then have you take the Levothyroxine, too. It is just a thought.   Taking the Selenium is good. Have you ever tried dissolving the thyroid medication on your tongue?    Your Testosterone level is on the low side. I am hoping with the new vaginal Testosterone that you will see this number go up. I am ok with your Estradiol and Progesterone levels.     Electronically signed by Lacretia Nicks, PA-C 05/03/2018, 1:17 PM

## 2018-05-05 ENCOUNTER — Encounter: Payer: Self-pay | Admitting: Surgery

## 2018-05-16 ENCOUNTER — Encounter: Payer: Self-pay | Admitting: Surgery

## 2018-05-16 ENCOUNTER — Other Ambulatory Visit: Payer: Self-pay | Admitting: Surgery

## 2018-05-16 DIAGNOSIS — E039 Hypothyroidism, unspecified: Secondary | ICD-10-CM

## 2018-05-16 DIAGNOSIS — R5383 Other fatigue: Secondary | ICD-10-CM

## 2018-05-18 ENCOUNTER — Telehealth: Payer: Self-pay

## 2018-05-18 NOTE — Telephone Encounter (Signed)
Incoming call from the patient. Her insurance does cover Armour Thyroid. She is calling to inquire if a prescription can be sent.    [facility] [place].    Will route to the provider.    Wannetta Sender, RN

## 2018-05-19 NOTE — Telephone Encounter (Signed)
Detailed myChart message sent to patient.    Karmen Altamirano G Yon Schiffman, PA

## 2018-06-28 ENCOUNTER — Encounter: Payer: Self-pay | Admitting: Gastroenterology

## 2018-06-28 ENCOUNTER — Telehealth: Payer: Self-pay

## 2018-06-28 NOTE — Telephone Encounter (Signed)
Incoming call from the patient. She inquires about the office's contact number and NPI number. She has standing orders at an out of state and needs these numbers so the lab can locate the orders. All requested information was reviewed.    Wannetta Sender, RN

## 2018-07-05 ENCOUNTER — Encounter: Payer: Self-pay | Admitting: Surgery

## 2018-07-05 DIAGNOSIS — N951 Menopausal and female climacteric states: Secondary | ICD-10-CM

## 2018-07-05 DIAGNOSIS — Z79890 Hormone replacement therapy (postmenopausal): Secondary | ICD-10-CM

## 2018-07-05 DIAGNOSIS — Z780 Asymptomatic menopausal state: Secondary | ICD-10-CM

## 2018-07-05 DIAGNOSIS — M6281 Muscle weakness (generalized): Secondary | ICD-10-CM

## 2018-07-05 DIAGNOSIS — E349 Endocrine disorder, unspecified: Secondary | ICD-10-CM

## 2018-07-05 DIAGNOSIS — R5383 Other fatigue: Secondary | ICD-10-CM

## 2018-07-10 ENCOUNTER — Telehealth: Payer: Self-pay

## 2018-07-10 NOTE — Telephone Encounter (Signed)
Incoming call from the patient. She is inquiring about lab results from 06-28-18. Lab results were sent to [facility] and will be reviewed by provider upon her return from vacation. Labs also sent to [facility].    The patient also states she is due for a Testosterone refill. Last refill: 03-02-18. Pharmacy: [facility].    Will route to the provider.    Wannetta Sender, RN

## 2018-07-13 MED ORDER — NON-SYSTEM MEDICATION *A*
0 refills | Status: DC
Start: 2018-07-13 — End: 2018-10-18

## 2018-07-13 NOTE — Telephone Encounter (Signed)
Incoming call from the patient to check on the status of her inquiries.    Will route to the provider.    Wannetta Sender, RN

## 2018-07-13 NOTE — Telephone Encounter (Signed)
Testosterone Rx filled and faxed to [facility] in [place].     Hard copy of Rx mailed.     I-stop accessed-no issues-last fill was in 02/2018.

## 2018-07-14 ENCOUNTER — Encounter: Payer: Self-pay | Admitting: Surgery

## 2018-07-14 NOTE — Telephone Encounter (Signed)
Eula Fried, RN 07/13/2018 3:30 PM EST      ----- Message -----  From: [name]  Sent: 07/13/2018 2:48 PM EST  To: [place] [facility]  Subject: RE:(No subject)     I am open to other thyroid treatments, but it would be helpful to know the cost up front. My rx coverage is under [employer]. Let me know if I need to make any calls re coverage. Thanks for taking care of the testosterone rx.  ----- Message -----  From: Blenda Mounts, PA   Sent: 07/13/2018 1:43 PM EST   To: [name]   Subject: (No subject)   My hesitation over changing medications/treatment plan is the whole issue around palpitations.     Sometimes the Nature-throid thyroid medication is hard to find, and Medicare may not be a fan. We could also try a mail order pharmacy. I just learned of one in [place] that is not as expensive as the other insurance companies.     Electronically signed by Lacretia Nicks, PA-C 07/13/2018, 1:43 PM

## 2018-07-20 ENCOUNTER — Encounter: Payer: Self-pay | Admitting: Surgery

## 2018-07-20 DIAGNOSIS — E039 Hypothyroidism, unspecified: Secondary | ICD-10-CM

## 2018-07-20 MED ORDER — THYROID 32.5 MG PO TABS *A*
32.5000 mg | ORAL_TABLET | Freq: Every day | ORAL | 4 refills | Status: DC
Start: 2018-07-20 — End: 2018-10-19

## 2018-07-20 NOTE — Telephone Encounter (Signed)
Wannetta Sender, RN 07/20/2018 1:20 PM EST      ----- Message -----  From: [name]  Sent: 07/20/2018 12:42 PM EST  To: [place] [facility]  Subject: RE:(No subject)     Hi Andreika Vandagriff, the [facility] nature-throid [facility] is 11.72 for a 3 month supply, so it is very affordable. Also, did you send the rx for the testosterone cream? I have heard nothing from the pharmacy, and I have been out of it for 2 weeks now. Thanks.  ----- Message -----  From: Blenda Mounts, PA   Sent: 07/14/2018 1:04 PM EST   To: [name]   Subject: (No subject)   The dose of Nature-throid would be 32.5 mg to equal your 50 mcg of Levothyroxine. Please go ahead and call them to see how much it would be.     Electronically signed by Lacretia Nicks, PA-C 07/14/2018, 1:04 PM

## 2018-07-21 ENCOUNTER — Encounter: Payer: Self-pay | Admitting: Surgery

## 2018-07-21 NOTE — Telephone Encounter (Signed)
Eula Fried, RN 07/20/2018 4:10 PM EST      ----- Message -----  From: [name]  Sent: 07/20/2018 2:59 PM EST  To: [place] [facility]  Subject: RE:(No subject)     I just spoke with [employer] [facility]. They are not finding a recent rx in my record, so they are sending you a refill request.  ----- Message -----  From: Blenda Mounts, PA   Sent: 07/20/2018 2:41 PM EST   To: [name]   Subject: (No subject)   Do you want me to mail you some lab requisitions?    Electronically signed by Lacretia Nicks, PA-C 07/20/2018, 2:41 PM

## 2018-07-26 ENCOUNTER — Encounter: Payer: Self-pay | Admitting: Surgery

## 2018-07-26 DIAGNOSIS — N959 Unspecified menopausal and perimenopausal disorder: Secondary | ICD-10-CM

## 2018-07-26 MED ORDER — PROGESTERONE 100 MG PO CAPS *I*
100.0000 mg | ORAL_CAPSULE | Freq: Every day | ORAL | 3 refills | Status: DC
Start: 2018-07-26 — End: 2019-04-16

## 2018-07-26 NOTE — Telephone Encounter (Signed)
Eula Fried, RN 07/21/2018 1:06 PM EST      ----- Message -----  From: [name]  Sent: 07/21/2018 1:02 PM EST  To: [place] [facility]  Subject: RE:(No subject)     It is the whole body testosterone cream, not the vaginal cream.  ----- Message -----  From: Blenda Mounts, PA   Sent: 07/21/2018 12:46 PM EST   To: [name]   Subject: (No subject)   Is the Testosterone Rx for the one for your skin (whole body one) or the vaginal one with Estradiol?    I am so sorry about this. For some reason or other, [facility] is getting really slow with the Rx's.     Electronically signed by Lacretia Nicks, PA-C 07/21/2018, 12:46 PM

## 2018-07-26 NOTE — Telephone Encounter (Signed)
Could you call [facility] in [place] to see if they received the Testosterone Rx that I took care of on 07/13/2018?  This is another case of slow mail.     Thanks!

## 2018-07-26 NOTE — Telephone Encounter (Signed)
From: [name]  Sent: 07/26/2018 10:04 AM EST  To: [facility]  Subject: RE:(No subject)    Thank you Mickel Baas. I just noticed I am out of refills for progesterone. Could I get a 90 day supply with refills [facility] [address]? Thank you!  ----- Message -----  From: Nurse Jolaine Artist   Sent: 07/26/2018 9:59 AM EST   To: [name]   Subject: RE:(No subject)   Jaclyn Shaggy,    We contacted [facility]. The prescription was shipped to you on 07/24/2018 so you should be receiving it shortly!    Have a great day!    Sincerely,  Mickel Baas, RN      ----- Message -----   From: [name]   Sent: 07/21/2018 1:02 PM EST   To: Blenda Mounts, PA  Subject: RE:(No subject)    It is the whole body testosterone cream, not the vaginal cream.  ----- Message -----  From: Blenda Mounts, PA   Sent: 07/21/2018 12:46 PM EST   To: [name]   Subject: (No subject)   Is the Testosterone Rx for the one for your skin (whole body one) or the vaginal one with Estradiol?    I am so sorry about this. For some reason or other, [facility] is getting really slow with the Rx's.     Electronically signed by Lacretia Nicks, PA-C 07/21/2018, 12:46 PM

## 2018-07-26 NOTE — Telephone Encounter (Signed)
Outgoing call to [facility] and spoke with Angela Nevin, a pharmacist. Inquired if the Testosterone Rx was received and Angela Nevin states it has been received. The prescription was shipped to the patient on 07-24-18.    Wannetta Sender, RN

## 2018-07-26 NOTE — Telephone Encounter (Signed)
From: [name]  Sent: 07/26/2018 1:23 PM EST  To: Blenda Mounts, PA  Subject: RE:(No subject)    I have been taking 100 mg per day for quite awhile now, so if my lab work is where it should be, I assume I should stay at that dose.   I have a question regarding my estradiol patch. Is the dose I am currently taking (0.6) available in the smaller patches that are applied twice weekly? Sometimes the larger patches loosen and fall off.  ----- Message -----  From: Blenda Mounts, PA   Sent: 07/26/2018 1:15 PM EST   To: [name]   Subject: (No subject)   The Prometrium (Progesterone Rx) is for 100 mg tabs. Are you taking 2 to make 200 mg? That's what your chart says. I will order it at soon as I know.     Electronically signed by Lacretia Nicks, PA-C 07/26/2018, 1:15 PM

## 2018-08-11 ENCOUNTER — Encounter: Payer: Self-pay | Admitting: Surgery

## 2018-08-11 DIAGNOSIS — Z780 Asymptomatic menopausal state: Secondary | ICD-10-CM

## 2018-08-11 DIAGNOSIS — E039 Hypothyroidism, unspecified: Secondary | ICD-10-CM

## 2018-08-11 DIAGNOSIS — Z1589 Genetic susceptibility to other disease: Secondary | ICD-10-CM

## 2018-08-11 DIAGNOSIS — N951 Menopausal and female climacteric states: Secondary | ICD-10-CM

## 2018-08-11 DIAGNOSIS — M6281 Muscle weakness (generalized): Secondary | ICD-10-CM

## 2018-08-11 DIAGNOSIS — R5383 Other fatigue: Secondary | ICD-10-CM

## 2018-08-11 NOTE — Telephone Encounter (Signed)
I did the standing orders for her bloodwork.  Could you please fax it to her lab in [place]?  Thanks!

## 2018-08-14 ENCOUNTER — Encounter: Payer: Self-pay | Admitting: Surgery

## 2018-08-18 ENCOUNTER — Encounter: Payer: Self-pay | Admitting: Gastroenterology

## 2018-10-05 ENCOUNTER — Encounter: Payer: Self-pay | Admitting: Gastroenterology

## 2018-10-09 ENCOUNTER — Encounter: Payer: Self-pay | Admitting: Surgery

## 2018-10-10 ENCOUNTER — Encounter: Payer: Self-pay | Admitting: Surgery

## 2018-10-11 NOTE — Telephone Encounter (Signed)
Call received from the pt. She is inquiring if there will be any changes made to her thyroid medication based off of recent results. She states she is due for the next refill and wants to know if she will continue taking the same dose. Will route to provider.    Eula Fried, RN

## 2018-10-13 ENCOUNTER — Encounter: Payer: Self-pay | Admitting: Surgery

## 2018-10-13 ENCOUNTER — Encounter: Payer: Self-pay | Admitting: Gastroenterology

## 2018-10-13 NOTE — Telephone Encounter (Signed)
Eula Fried, RN 10/13/2018 9:45 AM EDT      ----- Message -----  From: [name]  Sent: 10/13/2018 9:26 AM EDT  To: [facility]  Subject: RE:(No subject)     I called [facility] yesterday. They dispose of the blood samples after 7 days, which was yesterday! So I will have to go back in today for another blood draw. Not your fault. They showed a copy of the order but missed putting it into the system. I do have the original order, so t hopefully shouldn't be a problem.  ----- Message -----  From: Blenda Mounts, PA   Sent: 10/13/2018 8:15 AM EDT   To: [name]   Subject: (No subject)   The low thyroid probably has nothing to do with low Testosterone, but it really does help to have them balanced.  The lab may be able to add the TSH on to the labs they already ran. Sorry about that.   I would like you to increase your Testosterone to 1/2 gram daily. The next time you need a Rx, I am going to raise the actual dose. You will need to remind me.     Electronically signed by Lacretia Nicks, PA-C 10/13/2018, 8:15 AM

## 2018-10-13 NOTE — Telephone Encounter (Signed)
Eula Fried, RN 10/13/2018 8:10 AM EDT      ----- Message -----  From: [name]  Sent: 10/12/2018 5:04 PM EDT  To: [facility]  Subject: RE:(No subject)     Testosterone cream is 8mg /gm. I use 1/4gm per day. Vaginal cream contains 2mg /gm. The order is for 1 gm twice weekly. But I have been using it only once per week. I don't know what they mix it with, but it burns! It appears the lab must have missed the TSH. It did not come back with the other results. Ugh!! I will follow up on that. Would low thyroid levels impact my testosterone level?  ----- Message -----  From: Blenda Mounts, PA   Sent: 10/12/2018 7:59 AM EDT   To: [name]   Subject: (No subject)   HI! Your lab results do not show a TSH level. Do you know what that value was?  I think the Estrogen and Progesterone levels look ok. Your Testosterone level is low. How much Testosterone are you using in both of the Testosterone Rx's?  Your T3 is low. I have started using a slow release T3 with patients. I wonder if we should consider this for you. This is something made at a compounding pharmacy. I am not sure if [facility] does this or not. I have been writing it through [facility] in [place], [place]. My thought about the Armour last time we talked is that it provides some T3. I wonder is we use both this and the Synthroid if that may cause less palpitations for you. I just need to know what your TSH is.     Electronically signed by Lacretia Nicks, PA-C 10/12/2018, 7:59 AM

## 2018-10-18 ENCOUNTER — Encounter: Payer: Self-pay | Admitting: Surgery

## 2018-10-18 DIAGNOSIS — R5383 Other fatigue: Secondary | ICD-10-CM

## 2018-10-18 DIAGNOSIS — E349 Endocrine disorder, unspecified: Secondary | ICD-10-CM

## 2018-10-18 DIAGNOSIS — N952 Postmenopausal atrophic vaginitis: Secondary | ICD-10-CM

## 2018-10-18 DIAGNOSIS — M6281 Muscle weakness (generalized): Secondary | ICD-10-CM

## 2018-10-18 DIAGNOSIS — Z780 Asymptomatic menopausal state: Secondary | ICD-10-CM

## 2018-10-18 DIAGNOSIS — Z79890 Hormone replacement therapy (postmenopausal): Secondary | ICD-10-CM

## 2018-10-18 DIAGNOSIS — N951 Menopausal and female climacteric states: Secondary | ICD-10-CM

## 2018-10-18 MED ORDER — NON-SYSTEM MEDICATION *A*
0 refills | Status: DC
Start: 2018-10-18 — End: 2021-02-27

## 2018-10-18 NOTE — Telephone Encounter (Signed)
Testosterone Rx's x 2 done-systemic Rx and vaginal Rx.     Dose on systemic Rx raised to 4 mg of Testosterone per dose.      Hard copies of Rx's mailed.     I-stop accessed-no issues.

## 2018-10-19 ENCOUNTER — Encounter: Payer: Self-pay | Admitting: Surgery

## 2018-10-19 ENCOUNTER — Telehealth: Payer: Self-pay

## 2018-10-19 MED ORDER — THYROID 48.75 MG PO TABS
1.0000 | ORAL_TABLET | Freq: Every day | ORAL | 4 refills | Status: DC
Start: 2018-10-19 — End: 2021-02-27

## 2018-10-19 NOTE — Telephone Encounter (Signed)
Eula Fried, RN 10/19/2018 8:20 AM EDT      ----- Message -----  From: [name]  Sent: 10/18/2018 5:27 PM EDT  To: [facility]  Subject: RE:(No subject)     OK, so are you sending in a new rx or do I take more of my current dose. Also, will you be sending the scripts for testosterone cream and vaginal cream?  ----- Message -----  From: Blenda Mounts, PA   Sent: 10/18/2018 5:16 PM EDT   To: [name]   Subject: (No subject)   Please disregard my message about the Armour 7.5 mg. I could just try raising your dose and see how things go.     Electronically signed by Lacretia Nicks, PA-C 10/18/2018, 5:16 PM

## 2018-10-19 NOTE — Telephone Encounter (Signed)
Incoming call from the patient. She is inquiring about getting the Dexa scan order sent to her. Writer sent order to her email address.    The patient is also asking about getting her Armour Thyroid increased. She spoke with Lacretia Nicks, PA, yesterday about it. Will consult with the provider.    Wannetta Sender, RN

## 2018-10-20 ENCOUNTER — Other Ambulatory Visit: Payer: Self-pay | Admitting: Gastroenterology

## 2018-10-26 NOTE — Telephone Encounter (Signed)
Rx request from [facility] for a non-alcohol gel for her vaginal hormones.    OK with me.  Rx form faxed back to them.

## 2018-10-31 ENCOUNTER — Telehealth: Payer: Self-pay

## 2018-10-31 NOTE — Telephone Encounter (Signed)
Incoming call from the patient. She states she spoke with a pharmacist from [facility] about getting a new base for the  E2 0.15 mg + Testosterone 2 mg/gm Vaginal cream.    Writer placed call to [facility] and spoke with Margarita Grizzle. The patient needs a non-alcoholic gel. This should be noted on the Rx.     Will route to the provider.    Wannetta Sender, RN

## 2018-10-31 NOTE — Telephone Encounter (Signed)
I did this on Friday.  I faxed it from [place].  The fax did go through.     Electronically signed by Lacretia Nicks, PA-C  10/31/2018, 2:01 PM

## 2019-01-03 ENCOUNTER — Encounter: Payer: Self-pay | Admitting: Gastroenterology

## 2019-01-08 ENCOUNTER — Telehealth: Payer: Self-pay

## 2019-01-08 NOTE — Telephone Encounter (Signed)
Outside labs received from [facility] sent to scanning

## 2019-01-10 ENCOUNTER — Encounter: Payer: Self-pay | Admitting: Surgery

## 2019-01-15 ENCOUNTER — Encounter: Payer: Self-pay | Admitting: Surgery

## 2019-01-29 ENCOUNTER — Other Ambulatory Visit: Payer: Self-pay

## 2019-01-29 DIAGNOSIS — E349 Endocrine disorder, unspecified: Secondary | ICD-10-CM

## 2019-01-29 DIAGNOSIS — Z79890 Hormone replacement therapy (postmenopausal): Secondary | ICD-10-CM

## 2019-01-29 DIAGNOSIS — B009 Herpesviral infection, unspecified: Secondary | ICD-10-CM

## 2019-01-29 DIAGNOSIS — Z780 Asymptomatic menopausal state: Secondary | ICD-10-CM

## 2019-01-29 DIAGNOSIS — N951 Menopausal and female climacteric states: Secondary | ICD-10-CM

## 2019-01-29 MED ORDER — VALACYCLOVIR HCL 1000 MG PO TABS *I*
ORAL_TABLET | ORAL | 4 refills | Status: DC
Start: 2019-01-29 — End: 2019-09-12

## 2019-01-29 MED ORDER — ESTRADIOL 0.06 MG/24HR TD PTWK *A*
1.0000 | MEDICATED_PATCH | TRANSDERMAL | 3 refills | Status: DC
Start: 2019-01-29 — End: 2019-04-16

## 2019-01-29 NOTE — Telephone Encounter (Signed)
Incoming call from the patient. She needs new prescriptions for Climara 0.06 MG patch and Valacyclovir 1000 MG. She just relocated and is changing pharmacies. Her insurance is also about to change. The patient is requesting 90-day supplies for both prescriptions.    Upcoming AGY: 03-21-19.    [facility] [address], [place], [place].    Will route to the provider.    Wannetta Sender, RN

## 2019-03-21 ENCOUNTER — Encounter: Payer: Self-pay | Admitting: Surgery

## 2019-03-21 ENCOUNTER — Ambulatory Visit: Payer: BLUE CROSS/BLUE SHIELD | Admitting: Surgery

## 2019-03-21 VITALS — BP 136/86 | HR 74 | Temp 98.3°F | Wt 146.6 lb

## 2019-03-21 DIAGNOSIS — N952 Postmenopausal atrophic vaginitis: Secondary | ICD-10-CM

## 2019-03-21 DIAGNOSIS — Z01419 Encounter for gynecological examination (general) (routine) without abnormal findings: Secondary | ICD-10-CM

## 2019-03-21 DIAGNOSIS — E7212 Methylenetetrahydrofolate reductase deficiency: Secondary | ICD-10-CM

## 2019-03-21 DIAGNOSIS — Z780 Asymptomatic menopausal state: Secondary | ICD-10-CM

## 2019-03-21 DIAGNOSIS — Z1589 Genetic susceptibility to other disease: Secondary | ICD-10-CM

## 2019-03-21 DIAGNOSIS — Z79890 Hormone replacement therapy (postmenopausal): Secondary | ICD-10-CM

## 2019-03-21 DIAGNOSIS — E039 Hypothyroidism, unspecified: Secondary | ICD-10-CM

## 2019-03-21 NOTE — Progress Notes (Signed)
GYN ANNUAL EXAM:     [name] is a 63 y.o. G45P1101 postmenopausal female who presents for an annual GYN exam. The patient just moved [place], [place]  She is here visiting family.   She went to see a functional medicine NP in [place] who agreed with me about starting the patient on Slow release T3.  She has done this (started at 10 mcg daily and is getting it from one of the local compounding pharmacies) and is not having any palpitations with this.  She also prescribed low dose Naltrexone.   She did a Micronutrient panel and found that she was low in copper and some amino acids.  She found Candida overgrowth.   She continues to struggle with pain.  Her balance is getting worse.  She has tremors.   She has been wondering about Parkinson's.     GYN History  LMP: had an ablation.  LMP probably around age 50   Last pap smear: Date: 01/20/2018 Results: Negative cytology with negative HR HPV co-testing  History of abnormal pap smear: No.   The patient is sexually active.   Sexually active: single partner, contraception - post menopausal status  Together with partner: 46 years-married (2nd husband)  STD History: HSV 2  Current contraception: post menopausal status    OB History   Gravida Para Term Preterm AB Living   2 2 1 1  0 1   SAB TAB Ectopic Multiple Live Births   0 0 0 0        # Outcome Date GA Lbr Len/2nd Weight Sex Delivery Anes PTL Lv   2 Preterm  [redacted]w[redacted]d             Complications: Abruptio Placenta   1 Term               Obstetric Comments   Her child born at 4 weeks died at 65 months old from Tracheal stenosis.       Screening History:  Last mammogram: approximate date 10/20/2018 and was normal   Dexascan: 10/20/2018   Normal and improved  Colonoscopy: 3 years ago (Goes every 5 years)  The patient is taking hormone replacement therapy. Patient denies post-menopausal vaginal bleeding.  The patient wears seatbelts:yes  The patient participates in regular exercise: yes  Has the  patient ever been transfused or tattooed?:no  Lives with her husband.   Domestic violence:No    Past Medical History:   Diagnosis Date    Acne     Actinic keratosis     Allergy history unknown     Anemia     Back pain     Resolved    Complication of anesthesia     Novacaine Dizzy    Depression     Dermatitis 02/09/2012    Dermatitis     Diverticulosis     GERD (gastroesophageal reflux disease)     Gout     Herpes     Genital    IBS (irritable bowel syndrome)     Inflammatory arthritis 03/07/2017    Lumbar stenosis     Migraine     Neuropathy     Recurrent sinus infections     STD (sexually transmitted disease)     Gential Herpes    Urticaria     Varicella         Past Surgical History:   Procedure Laterality Date    CESAREAN SECTION, CLASSIC  1985    ENDOMETRIAL  ABLATION  2009    INCONTINENCE SURGERY  2010    NASAL POLYP SURGERY  2003    RECTOCELE REPAIR  2010    SPHINCTEROPLASTY  2010    ABSCESS AFTER SURGERY        Allergies   Allergen Reactions    Amoxicillin Hives    Demerol Nausea And Vomiting    Environmental Allergies     Erythromycin      ABDOMINAL PAIN    Gluten Meal      BLOATING, SNEEZING, WATERY EYES, NEUROPATHY FLARE    Sulfa Antibiotics Hives    Plaquenil [Hydroxychloroquine] Other (See Comments)     Blurred vision        Current Outpatient Medications on File Prior to Visit   Medication Sig Dispense Refill    Multiple Vitamin (MULTI-VITAMIN DAILY PO) Take by mouth      mycophenolate (CELLCEPT) 500 MG tablet Take 1,000 mg by mouth 2 times daily      DULoxetine (CYMBALTA) 60 MG DR capsule Take 60 mg by mouth daily      levothyroxine (SYNTHROID, LEVOTHROID) 50 MCG tablet Take 50 mcg by mouth daily (before breakfast)      estradiol (CLIMARA) 0.06 MG/24HR patch Place 1 patch onto the skin once a week Remove & discard old patch prior to applying new patch. 12 patch 3    valACYclovir (VALTREX) 1 GM tablet TAKE 1 TABLET DAILY 90 tablet 4    Non-System Medication  Medication/Supply: E2 0.15 mg + Testosterone  2 mg/gm Vaginal cream  Directions for Use: 1 gm into the vagina 2 times per week  Code F   MDD: 1 gm  Each=gm 30 each 0    Non-System Medication Medication/Supply: Testosterone 8 mg/gm topiclick system  Directions for Use: Apply 1/2 gm  to skin daily in the am  MDD: 1/2 gm  Code F 45 each 0    progesterone (PROMETRIUM) 100 MG capsule Take 1 capsule (100 mg total) by mouth daily 90 capsule 3    liothyronine (CYTOMEL) 5 MCG tablet TAKE 1 TABLET BY MOUTH TWICE A DAY IN THE AM AND ONE BEFORE SUPPER (Patient taking differently: 10 mcg TAKE 1 TABLET BY MOUTH TWICE A DAY IN THE AM AND ONE BEFORE SUPPER) 180 tablet 1    omeprazole (PRILOSEC) 40 MG capsule Take 40 mg by mouth daily   BEFORE A MEAL  1    tiZANidine (ZANAFLEX) 4 MG tablet Take 8 mg by mouth nightly as needed  AT BEDTIME FOR MUSCLE SPASMS  2    BREO ELLIPTA 100-25 MCG/INH inhaler One inhalation a day  2    PROAIR HFA 108 (90 BASE) MCG/ACT inhaler Inhale 2 puffs into the lungs every 4-6 hours as needed  2    gabapentin (GABAPENTIN) 300 MG capsule Take 3 capsules (900 mg total) by mouth nightly 270 capsule 3    cetirizine (ZYRTEC) 10 MG tablet Take 10 mg by mouth daily      Probiotic Product (PROBIOTIC FORMULA PO) Take 1 capsule by mouth daily      DIGESTIVE ENZYMES PO Take 1 capsule by mouth daily      Cholecalciferol (VITAMIN D) 1000 UNIT tablet TAKE 1 TABLET DAILY. (Patient taking differently: Take 4,000 Units by mouth daily   )  0    Thyroid 48.75 MG TABS Take 1 tablet by mouth daily (Patient not taking: Reported on 03/21/2019) 90 tablet 4    predniSONE (DELTASONE) 5 MG tablet Take 5 mg by  mouth daily  1    HYDROcodone-acetaminophen (NORCO) 5-325 MG per tablet Take 1 tablet by mouth every 6 hours as needed for Pain   Max daily dose: 4 tablets 28 tablet 0    SUMAtriptan (IMITREX) 100 MG tablet Take 1 tablet (100 mg total) by mouth as needed   FOR MIGRAINE RELIEF. MAY REPEAT TWO HOURS LATER. MAXIMUM  DAILY DOSE 200MG  PER DAY 9 tablet 5     No current facility-administered medications on file prior to visit.        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    Review of Systems  Pertinent items are noted in HPI.    Weight gain, fatigue, swelling, muscle weakness, thyroid problems, numbness and tingling in arms and legs, constipation, bothersome loss of urine and urinary frequency.   Objective:     BP 136/86    Pulse 74    Temp 36.8 C (98.3 F)    Wt 66.5 kg (146 lb 9.6 oz)    SpO2 98%    BMI 24.40 kg/m   General appearance: alert, appears stated age and cooperative  Neck: no adenopathy, supple, symmetrical, trachea midline and thyroid not enlarged, symmetric, no tenderness/mass/nodules  Lungs: clear to auscultation bilaterally  Breasts: normal appearance, no masses or tenderness  Heart: regular rate and rhythm, S1, S2 normal, no murmur, click, rub or gallop  Abdomen: soft, non-tender; bowel  sounds normal; no masses,  no organomegaly  Pelvic: cervix normal in appearance, external genitalia normal, no adnexal masses or tenderness, no cervical motion tenderness, rectovaginal septum normal, uterus normal size, shape, and consistency and vagina normal without discharge  Extremities: extremities normal, atraumatic, no cyanosis or edema  Skin: Skin color, texture, turgor normal. No rashes or lesions  Lymph nodes: Cervical, supraclavicular, and axillary nodes normal.      Examination chaperoned by Langley Gauss MA.     Assessment:     Delpha Gibney is a 63 y.o. female who presents for an annual GYN exam. She has the following concerns:   1. Well woman exam with routine gynecological exam     2. Postmenopausal state     3. Postmenopausal HRT (hormone replacement therapy)     4. Vaginal atrophy     5. 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 Well woman exam with routine gynecological exam  Recent Dexascan discussed.     2. Postmenopausal state  Consider finding a new neurologist to get worked up for [employer].     3. Postmenopausal HRT (hormone replacement therapy)  Continue vaginal HRT-Testosterone.  Continue systemic HRT.     4. Vaginal atrophy  See above.     5. Hypothyroidism  The functional medicine NP in [place] will manage this for her.   She wants me to continue to manage her hormones.     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    Blenda Mounts, PA  03/23/2019

## 2019-04-16 ENCOUNTER — Other Ambulatory Visit: Payer: Self-pay

## 2019-04-16 DIAGNOSIS — E039 Hypothyroidism, unspecified: Secondary | ICD-10-CM

## 2019-04-16 DIAGNOSIS — E349 Endocrine disorder, unspecified: Secondary | ICD-10-CM

## 2019-04-16 DIAGNOSIS — N951 Menopausal and female climacteric states: Secondary | ICD-10-CM

## 2019-04-16 DIAGNOSIS — Z780 Asymptomatic menopausal state: Secondary | ICD-10-CM

## 2019-04-16 DIAGNOSIS — R5383 Other fatigue: Secondary | ICD-10-CM

## 2019-04-16 DIAGNOSIS — N959 Unspecified menopausal and perimenopausal disorder: Secondary | ICD-10-CM

## 2019-04-16 DIAGNOSIS — Z79890 Hormone replacement therapy (postmenopausal): Secondary | ICD-10-CM

## 2019-04-16 NOTE — Telephone Encounter (Signed)
Incoming call from the patient stating that she needs to change pharmacies to [facility]. She needs a 90 day supply of her medications sent to this pharmacy.    Last AGY: 03-21-19.    Will route to the provider.    Wannetta Sender, RN

## 2019-04-17 MED ORDER — LIOTHYRONINE SODIUM 5 MCG PO TABS *I*
10.0000 ug | ORAL_TABLET | Freq: Two times a day (BID) | ORAL | 4 refills | Status: DC
Start: 2019-04-17 — End: 2021-02-27

## 2019-04-17 MED ORDER — LEVOTHYROXINE SODIUM 50 MCG PO TABS *I*
50.0000 ug | ORAL_TABLET | Freq: Every day | ORAL | 4 refills | Status: DC
Start: 2019-04-17 — End: 2020-06-23

## 2019-04-17 MED ORDER — PROGESTERONE 100 MG PO CAPS *I*
100.0000 mg | ORAL_CAPSULE | Freq: Every day | ORAL | 3 refills | Status: DC
Start: 2019-04-17 — End: 2021-02-27

## 2019-04-17 MED ORDER — ESTRADIOL 0.06 MG/24HR TD PTWK *A*
1.0000 | MEDICATED_PATCH | TRANSDERMAL | 3 refills | Status: DC
Start: 2019-04-17 — End: 2021-02-27

## 2019-05-10 DIAGNOSIS — K259 Gastric ulcer, unspecified as acute or chronic, without hemorrhage or perforation: Secondary | ICD-10-CM | POA: Insufficient documentation

## 2019-05-10 DIAGNOSIS — J45909 Unspecified asthma, uncomplicated: Secondary | ICD-10-CM | POA: Insufficient documentation

## 2019-05-10 DIAGNOSIS — E063 Autoimmune thyroiditis: Secondary | ICD-10-CM | POA: Insufficient documentation

## 2019-09-11 ENCOUNTER — Encounter: Payer: Self-pay | Admitting: Surgery

## 2019-09-11 DIAGNOSIS — B009 Herpesviral infection, unspecified: Secondary | ICD-10-CM

## 2019-09-12 MED ORDER — VALACYCLOVIR HCL 1000 MG PO TABS *I*
ORAL_TABLET | ORAL | 2 refills | Status: DC
Start: 2019-09-12 — End: 2024-04-02

## 2019-09-12 NOTE — Telephone Encounter (Signed)
Nursing Phone Triage Note:    History:  [name]  requests a refill on   valACYclovir (VALTREX) 1 GM tablet 90 tablet 4 01/29/2019     Sig: TAKE 1 TABLET DAILY      [facility]       Chart review:  The last time this prescription was written was 01/29/2019 by  Dr Jolayne Haines.    The plan for follow-up with regard to this medicine at the patient's last visit was AGY due 10/21.    The patient's last annual exam: 03/21/2019.    Allergies confirmed with the patient:  Allergies   Allergen Reactions    Amoxicillin Hives    Demerol Nausea And Vomiting    Environmental Allergies     Erythromycin      ABDOMINAL PAIN    Gluten Meal      BLOATING, SNEEZING, WATERY EYES, NEUROPATHY FLARE    Sulfa Antibiotics Hives    Plaquenil [Hydroxychloroquine] Other (See Comments)     Blurred vision       Counseling, Education, and Plan:  Phone encounter routed to providers to consider medication coverage until the time of the patient's annual exam visit.    Routed to POB pool for orders.      Liz Malady, RN  09/12/2019  7:30 AM

## 2019-11-19 DIAGNOSIS — G7249 Other inflammatory and immune myopathies, not elsewhere classified: Secondary | ICD-10-CM | POA: Insufficient documentation

## 2020-03-05 ENCOUNTER — Telehealth: Payer: Self-pay

## 2020-03-05 NOTE — Telephone Encounter (Signed)
Left message to ask if the appointment I scheduled for her on 04/11/20 is ok. She asked to be scheduled when she would be back in [place].

## 2020-03-27 ENCOUNTER — Ambulatory Visit: Payer: BLUE CROSS/BLUE SHIELD | Admitting: Surgery

## 2020-04-11 ENCOUNTER — Ambulatory Visit: Payer: 59 | Admitting: Surgery

## 2020-06-22 ENCOUNTER — Other Ambulatory Visit: Payer: Self-pay | Admitting: Surgery

## 2020-06-23 NOTE — Telephone Encounter (Signed)
Nursing Phone Triage Note:    History:  [facility] requests a refill on   levothyroxine (SYNTHROID, LEVOTHROID) 50 MCG tablet 90 tablet 4 04/17/2019     Sig - Route: Take 1 tablet (50 mcg total) by mouth daily (before breakfast) - Oral             Chart review:  The last time this prescription was written was 04/17/2019 by Ila G. Lorelee Market .    If a monthly prescription, patient is due for her next prescription on 1/22.  The plan for follow-up with regard to this medicine at the patient's last visit was AGY scheduled 02/27/21.    The patient's last annual exam: 03/21/2019.    Allergies confirmed with the patient:  Allergies   Allergen Reactions    Amoxicillin Hives    Demerol Nausea And Vomiting    Environmental Allergies     Erythromycin      ABDOMINAL PAIN    Gluten Meal      BLOATING, SNEEZING, WATERY EYES, NEUROPATHY FLARE    Sulfa Antibiotics Hives    Plaquenil [Hydroxychloroquine] Other (See Comments)     Blurred vision       Counseling, Education, and Plan:  Phone encounter routed to providers to consider medication coverage until the time of the patient's next visit.    Routed to Ila G. Lorelee Market  for orders.      Liz Malady, RN  06/23/2020  8:48 AM

## 2020-07-18 IMAGING — CT CT ABDOMEN AND PELVIS WITH CONTRAST
2 of 3 series · 16 of 46 positions shown, 18 images · IV contrast (isovue)
Comparison: None

CT ABDOMEN AND PELVIS WITH CONTRAST, 07/18/2020 [DATE]: 
A search for DICOM formatted images was conducted for prior CT imaging studies 
completed at a non-affiliated media free facility. 
CLINICAL INDICATION: Intermittent right lower quadrant pain off and on for 
years. Progressive over the last 6 weeks. History of kidney stones and 
diverticulosis.
TECHNIQUE: The abdomen and pelvis was scanned from lung bases through the pubic 
rami with 100 mL of Isovue 300 on a high-resolution CT scanner using dose 
reduction techniques.  Routine MPR reconstructions were performed.

[Series 4: abd/pel ax w · axial · 0.79mm/px · z∈[-469,-64]mm · 13 of 157 slices shown, 15 images]
[im 11/157  soft-tissue]
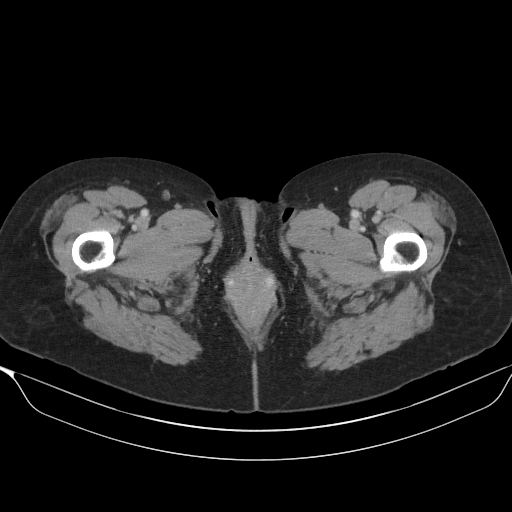
[im 11/157  bone]
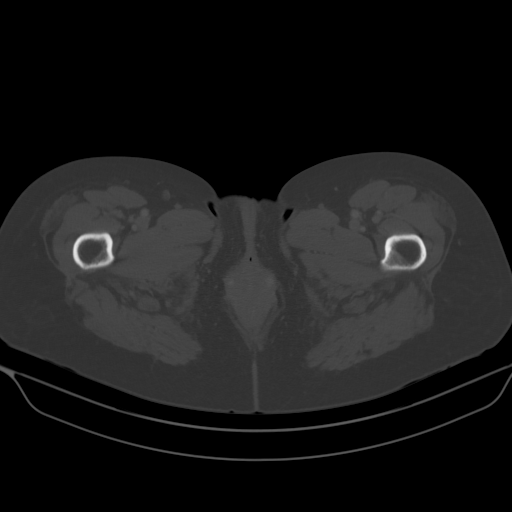
[im 21/157  soft-tissue]
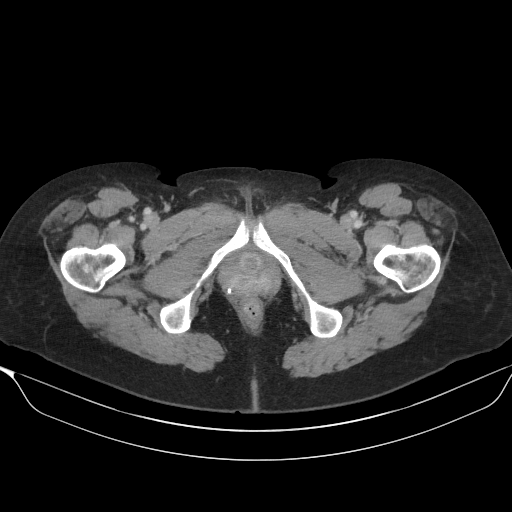
[im 31/157  soft-tissue]
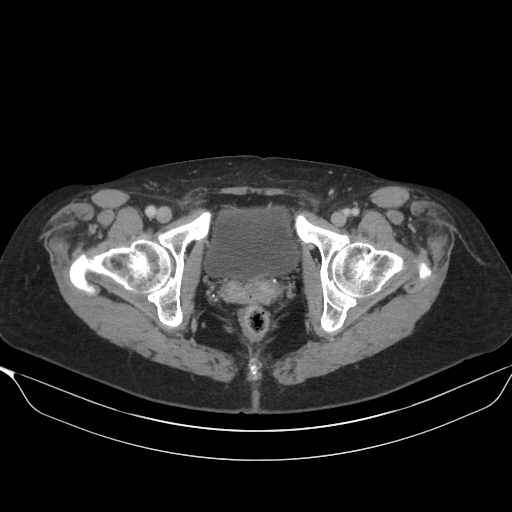
[im 46/157  soft-tissue]
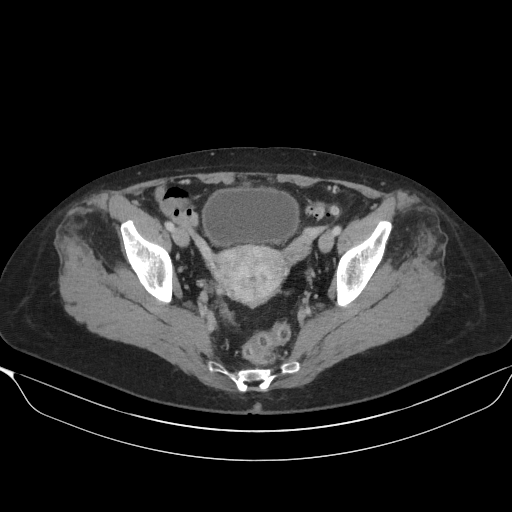
[im 56/157  soft-tissue]
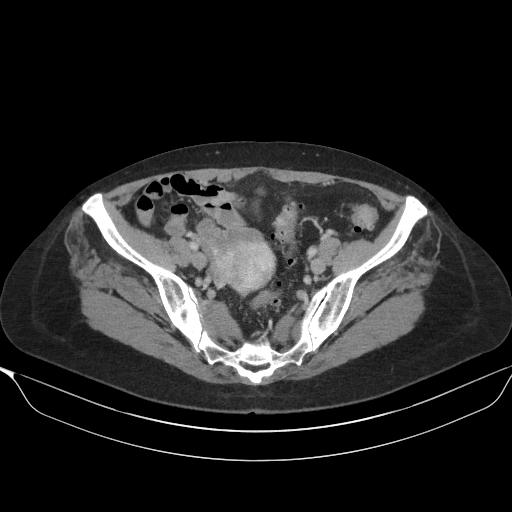
[im 66/157  soft-tissue]
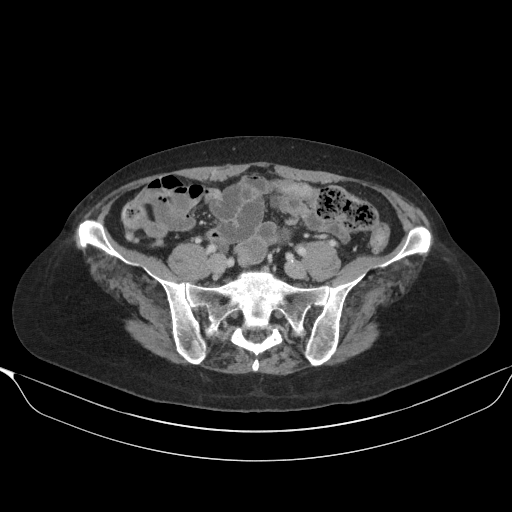
[im 81/157  soft-tissue]
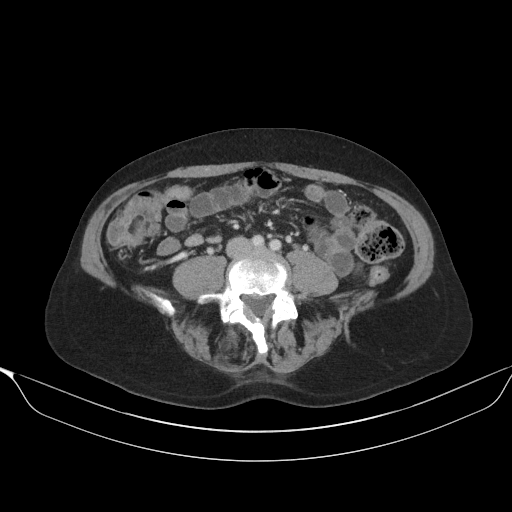
[im 91/157  soft-tissue]
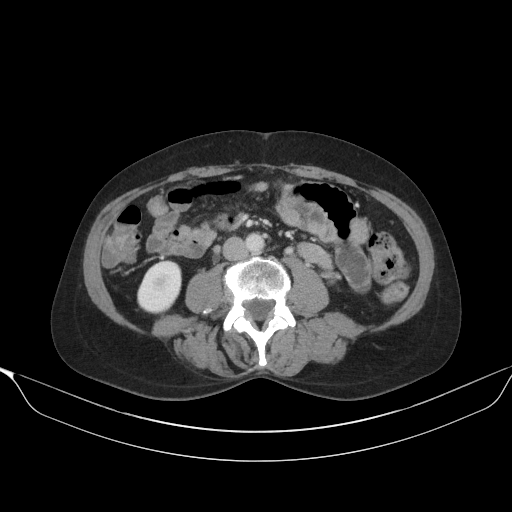
[im 101/157  soft-tissue]
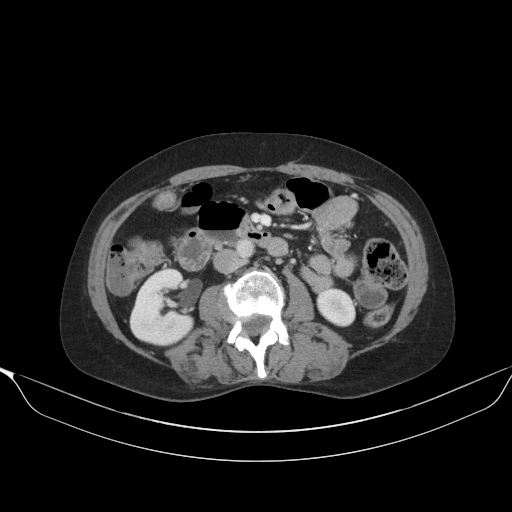
[im 101/157  bone]
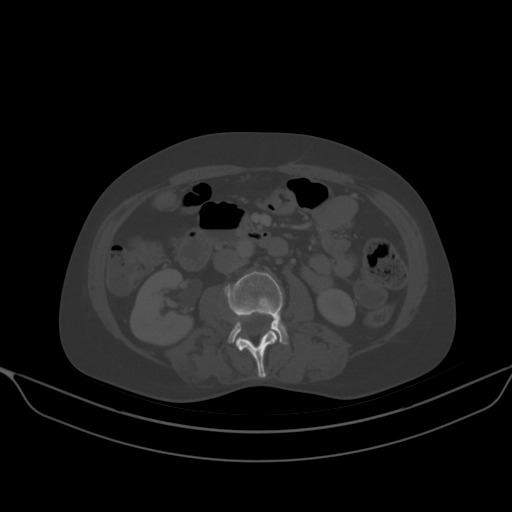
[im 111/157  soft-tissue]
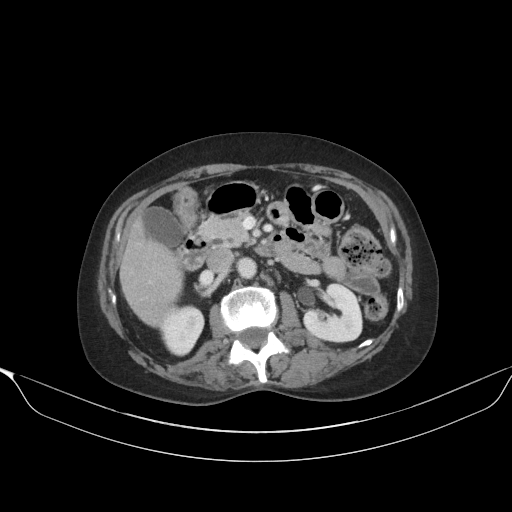
[im 126/157  soft-tissue]
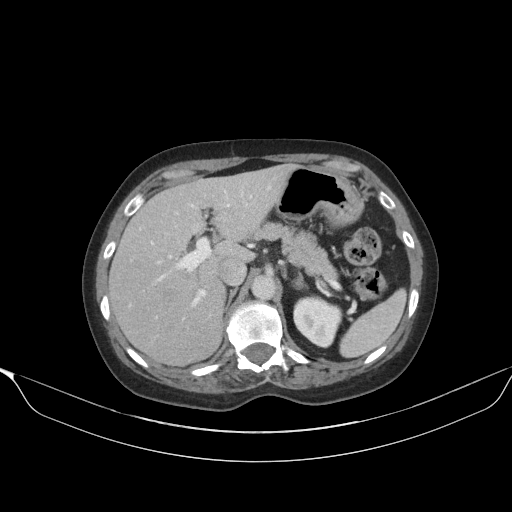
[im 136/157  soft-tissue]
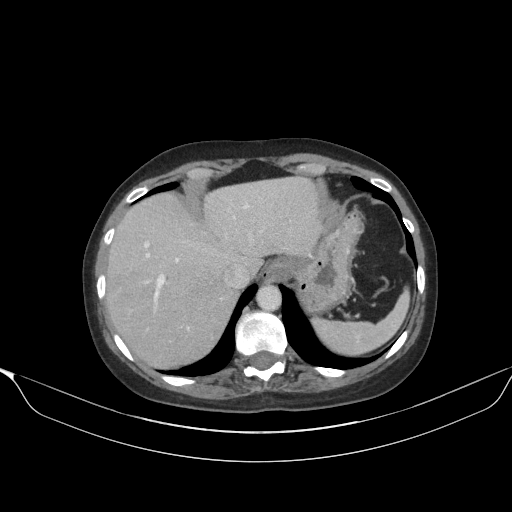
[im 146/157  soft-tissue]
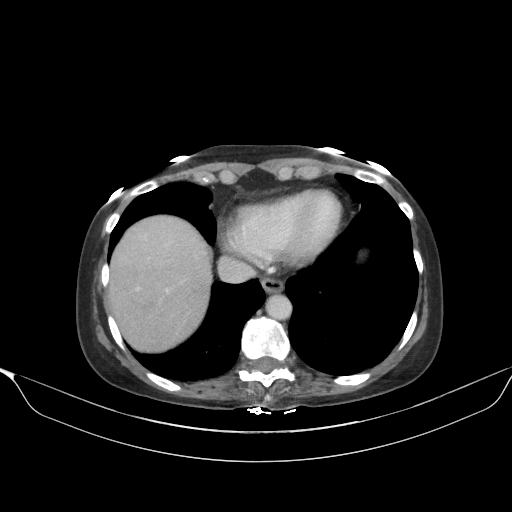

[Series 5: abd/pel cor w · coronal · 0.77mm/px · 3 of 145 slices shown]
[im 49/145  soft-tissue]
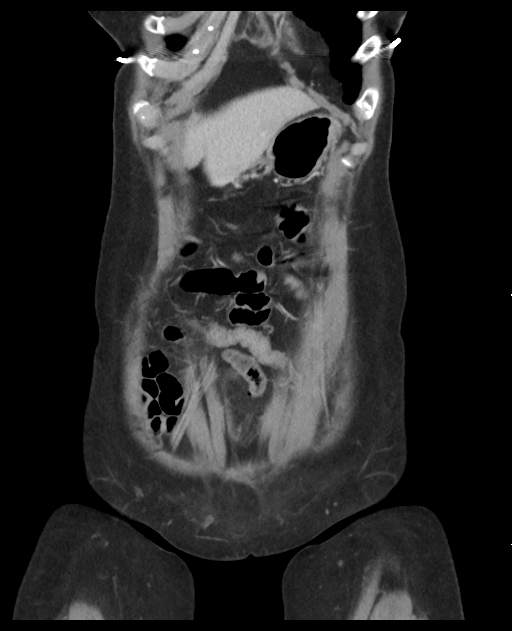
[im 65/145  soft-tissue]
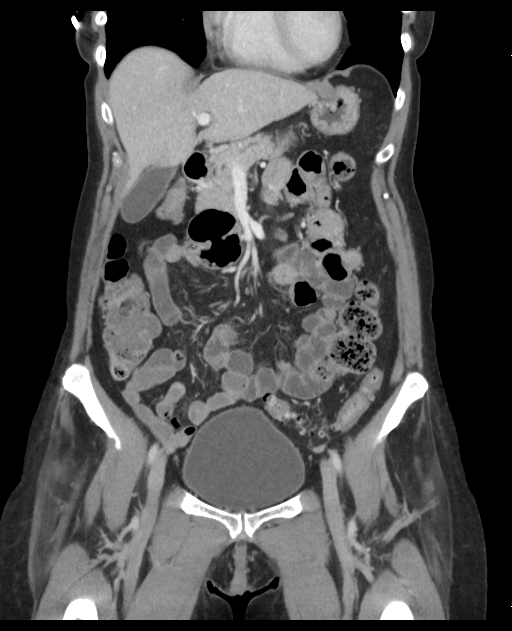
[im 81/145  soft-tissue]
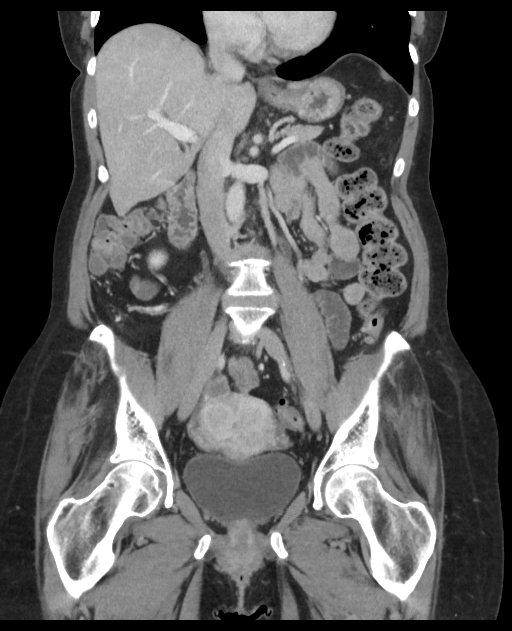

[16 of 46 positions shown; findings below may reference images not displayed]

FINDINGS: LUNG BASES: Reticular nodular changes in the left lung base best seen on image 
10. Nonspecific though most likely underlying bronchiolitis. Dedicated CT of the 
chest may better evaluate extent of disease. 
HEPATOBILIARY: No mass or biliary dilatation. No gallstones. 
SPLEEN: Normal in size. 
PANCREAS: No ductal dilatation or mass. 
ADRENALS: No mass. 
GENITOURINARY: No enhancing mass or hydronephrosis.  Bladder is unremarkable. 
LYMPH NODES: No adenopathy. 
STOMACH, SMALL BOWEL AND COLON: Normal appendix. Diverticulosis. No obstructive 
changes or free fluid. 
VASCULAR STRUCTURES: Atherosclerotic changes without aneurysmal dilatation. 
MUSCULOSKELETAL: Endplate changes especially at L2-L3. 
ADDITIONAL FINDINGS: Uterus and adnexa are without evidence of mass. No free 
fluid seen.
IMPRESSION: Suspect inflammatory changes left lung base incompletely evaluated. 
Atherosclerotic changes and degenerative changes. 
RADIATION DOSE REDUCTION: All CT scans are performed using radiation dose 
reduction techniques, when applicable.  Technical factors are evaluated and 
adjusted to ensure appropriate moderation of exposure.  Automated dose 
management technology is applied to adjust the radiation doses to minimize 
exposure while achieving diagnostic quality images.

## 2020-08-05 IMAGING — CT CT CHEST WITHOUT CONTRAST
2 of 3 series · 15 of 36 positions shown, 18 images · non-contrast
Comparison: 07/18/2020

CT CHEST WITHOUT CONTRAST, 08/05/2020 [DATE]: 
CLINICAL INDICATION:  Pneumonitis 
A search for DICOM formatted images was conducted for prior CT imaging studies 
completed at a non-affiliated media free facility.
TECHNIQUE: The chest was scanned from base of neck through the lung bases 
without contrast on a high resolution low dose CT scanner. Routine MPR and MIP 
3D renderings were reconstructed on an independent workstation with concurrent 
physician supervision.

[Series 2: chest 2.0 i31s 3 · axial · 0.73mm/px · z∈[-233,+21]mm · 12 of 151 slices shown, 15 images]
[im 12/151  mediastinal]
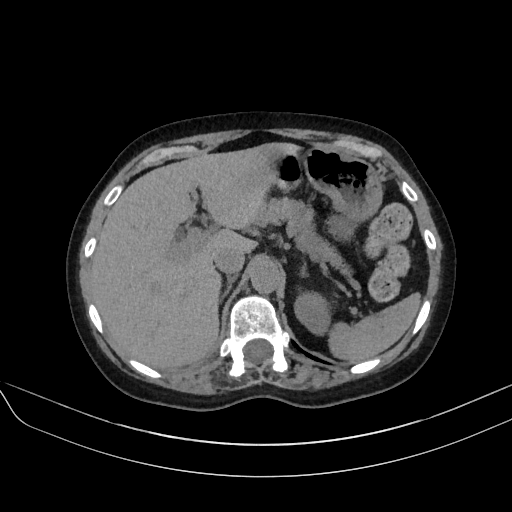
[im 12/151  lung]
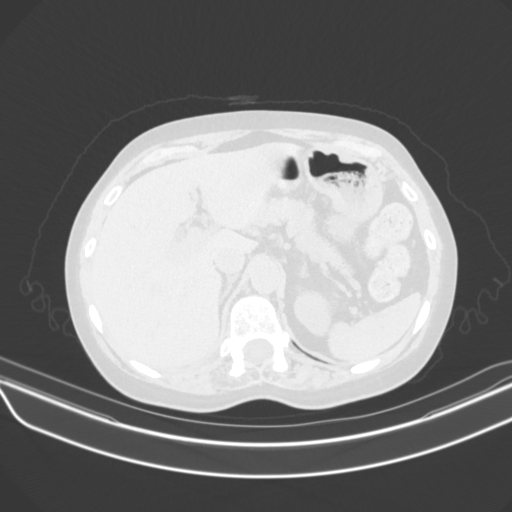
[im 23/151  lung]
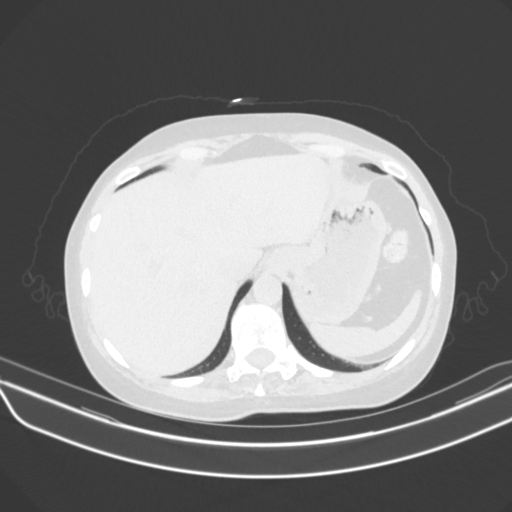
[im 34/151  lung]
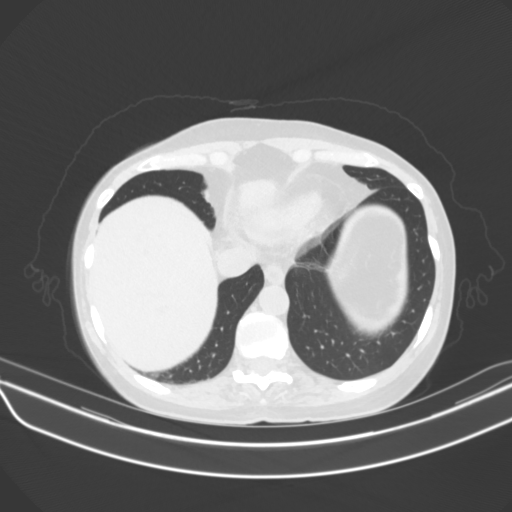
[im 45/151  lung]
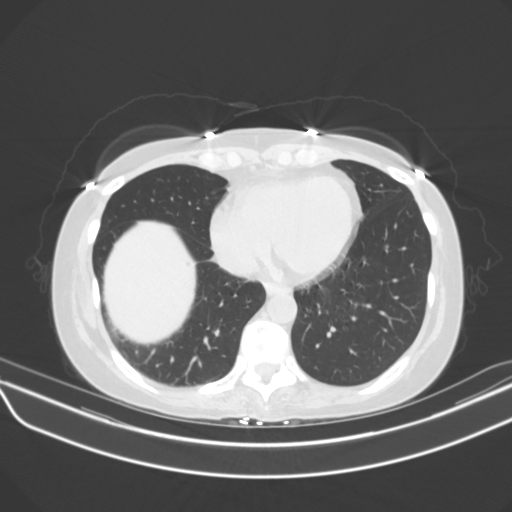
[im 56/151  mediastinal]
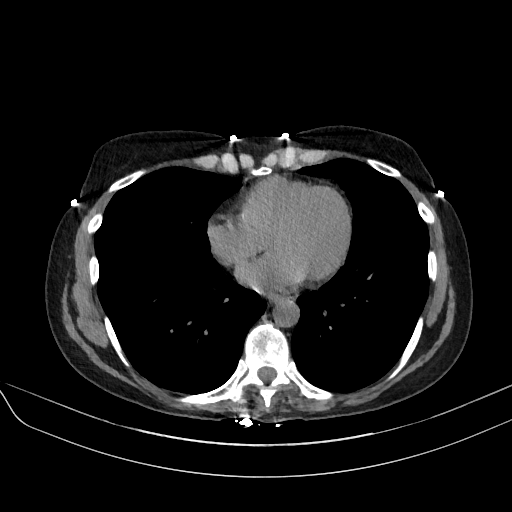
[im 56/151  lung]
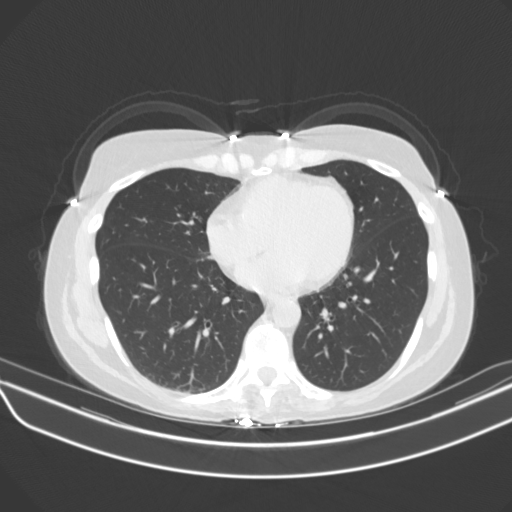
[im 67/151  lung]
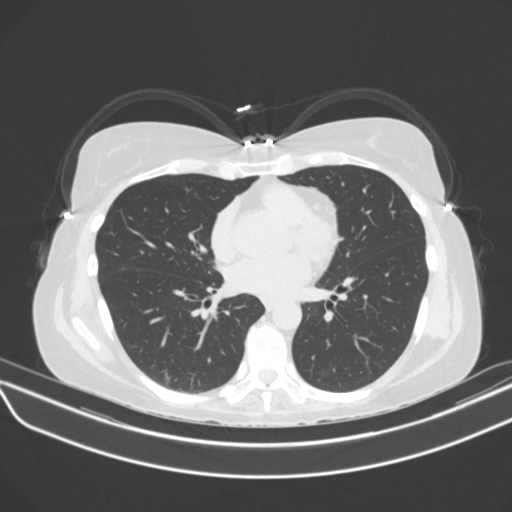
[im 84/151  lung]
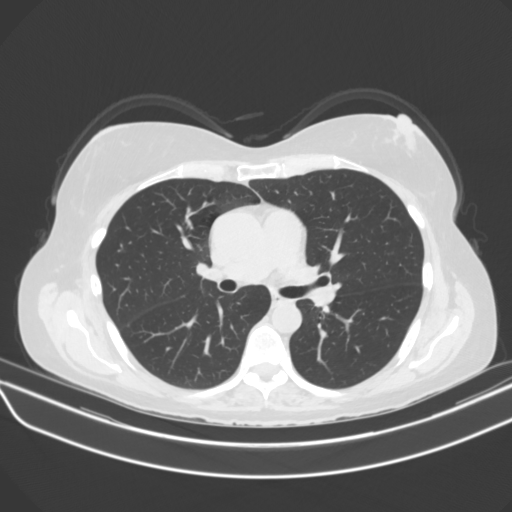
[im 95/151  lung]
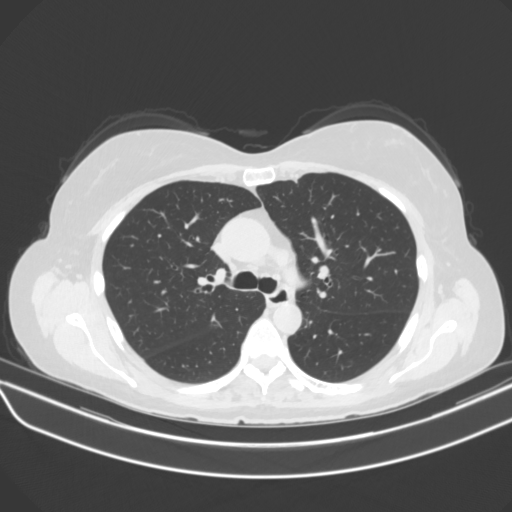
[im 106/151  mediastinal]
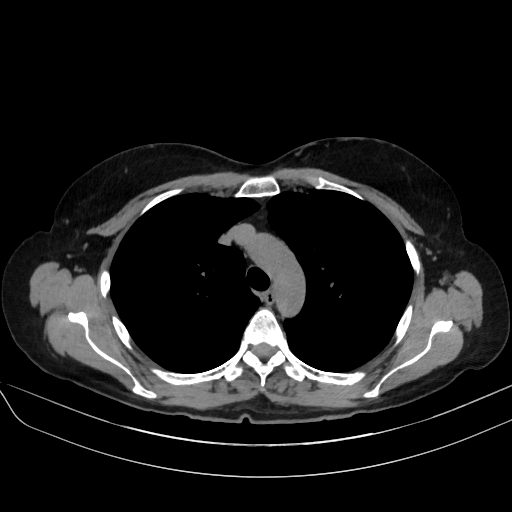
[im 106/151  lung]
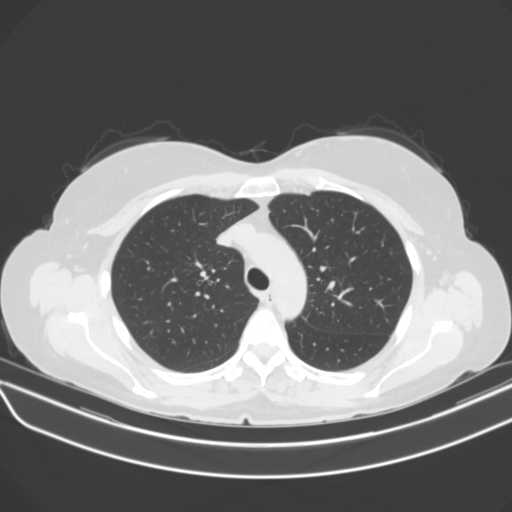
[im 117/151  lung]
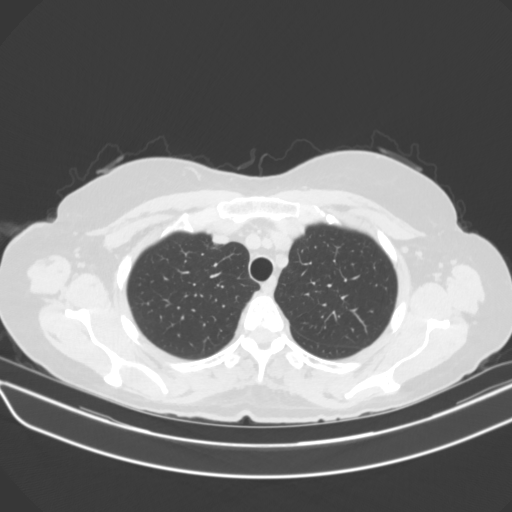
[im 128/151  lung]
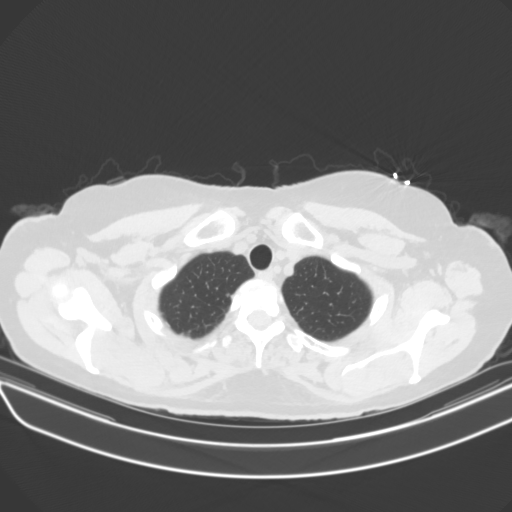
[im 139/151  lung]
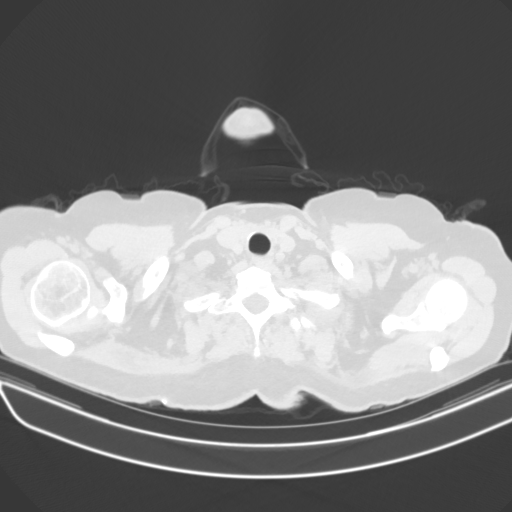

[Series 5: coronal · coronal · 0.56mm/px · 3 of 103 slices shown]
[im 21/103  lung]
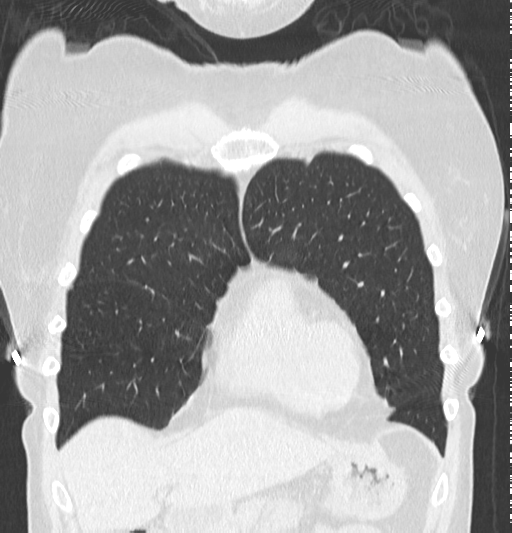
[im 41/103  lung]
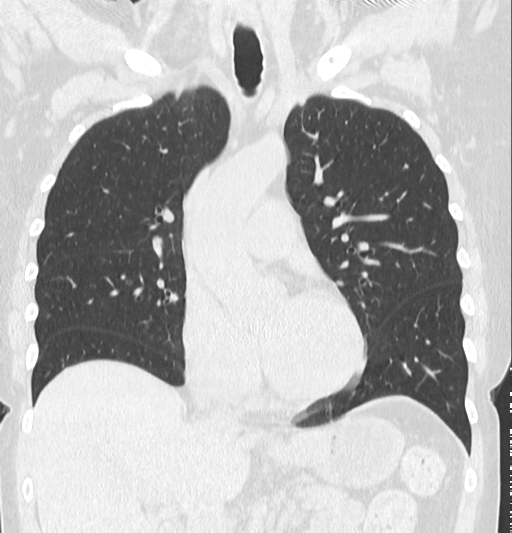
[im 62/103  lung]
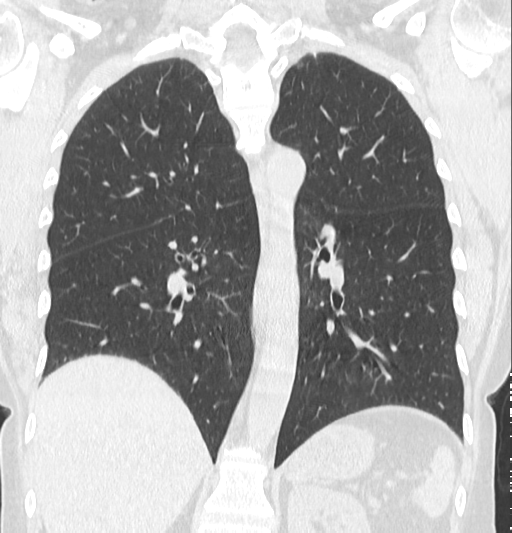

[15 of 36 positions shown; findings below may reference images not displayed]

FINDINGS: LUNGS AND PLEURA:  The reticulonodular densities at the left lung base appear to 
have resolved. No new abnormalities in the lung are identified. There are small 
sites of localized hazy density at the lung bases that are not identifiable on 
the comparison. The appearance is most consistent with an inflammatory or 
infectious reaction a 3 month follow-up is recommended to reevaluate these 
lesions. No abnormalities in the appearance of the pleura are exhibited. 
MEDIASTINUM:  No adenopathy. Normal heart size. No pericardial effusion. 
CHEST WALL/AXILLA: No mass or adenopathy. 
UPPER ABDOMEN: Negative. 
MUSCULOSKELETAL: No acute abnormality.
IMPRESSION: The fibronodular density in the medial left lower lobe has resolved since the 
comparison. There are small sites of groundglass density with a central focus of 
increased density suggesting an inflammatory or infectious process. Follow-up in 
3 months would be helpful to assess the level of activity in these 
abnormalities. No other active pathologic process is apparent. 
RADIATION DOSE REDUCTION: All CT scans are performed using radiation dose 
reduction techniques, when applicable.  Technical factors are evaluated and 
adjusted to ensure appropriate moderation of exposure.  Automated dose 
management technology is applied to adjust the radiation doses to minimize 
exposure while achieving diagnostic quality images.

## 2020-11-05 IMAGING — CT CT CHEST WITHOUT CONTRAST
2 of 4 series · 15 of 36 positions shown, 18 images · non-contrast
Comparison: Comparison was made to the prior exam(s) within the last 12 months 
08/05/20

FINAL Diagnostic Imaging Report 
________________________________________________________________________________________________ 
CT CHEST WITHOUT CONTRAST, 11/05/2020 [DATE]: 
CLINICAL INDICATION: B20.G2 = ABNORMAL CT FINDINGS 
A search for DICOM formatted images was conducted for prior CT imaging studies 
completed at a non-affiliated media free facility.
TECHNIQUE: The chest was scanned from base of neck through the lung bases 
without contrast on a high resolution low dose CT scanner. Routine MPR and MIP 
3D renderings were reconstructed on an independent workstation with concurrent 
physician supervision.

[Series 2: chest 2.0 i31s 3 · axial · 0.78mm/px · z∈[-291,-3]mm · 12 of 160 slices shown, 15 images]
[im 8/160  mediastinal]
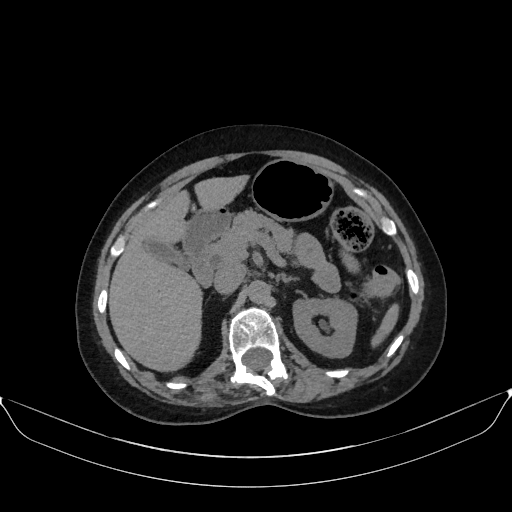
[im 8/160  lung]
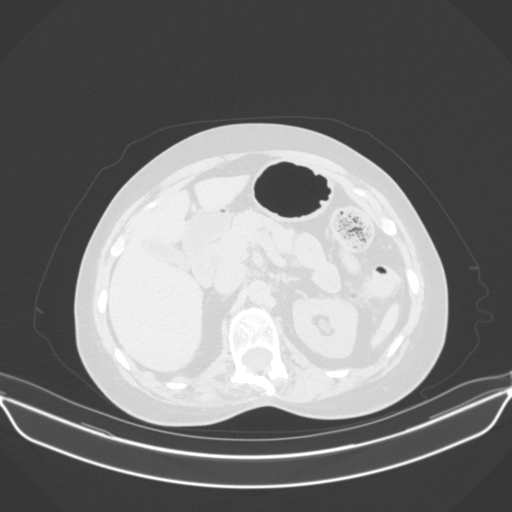
[im 23/160  lung]
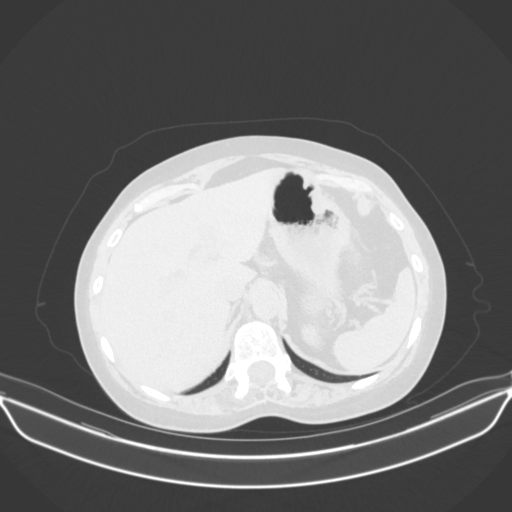
[im 38/160  lung]
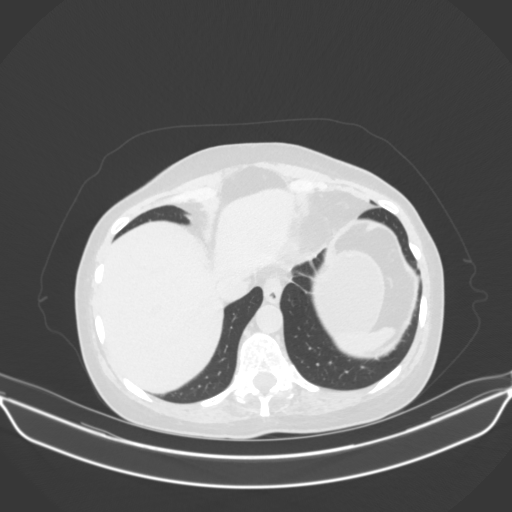
[im 46/160  lung]
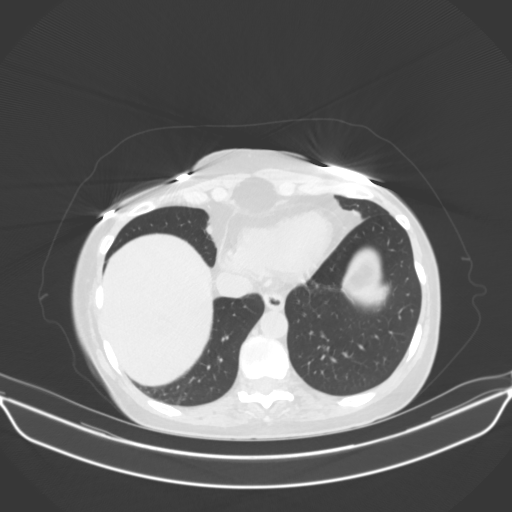
[im 61/160  mediastinal]
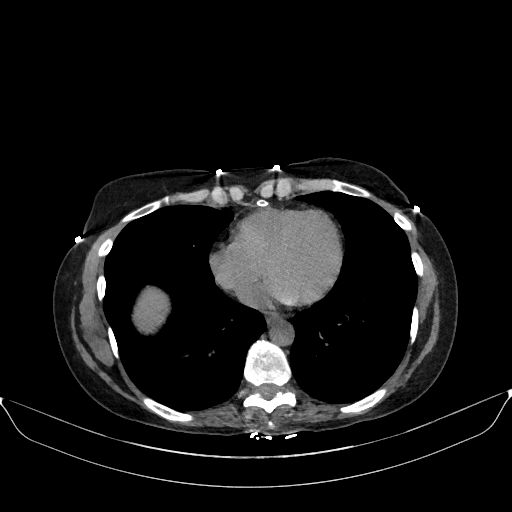
[im 61/160  lung]
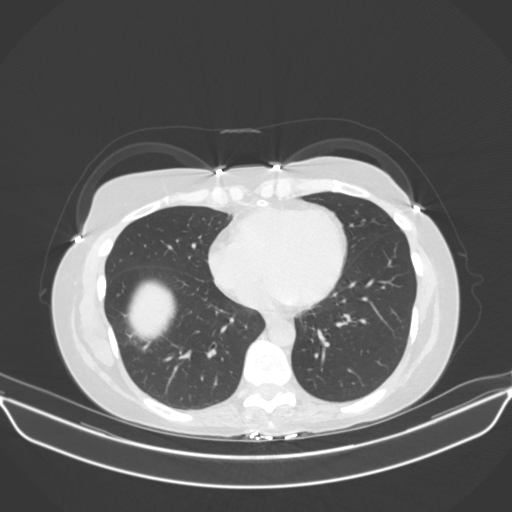
[im 76/160  lung]
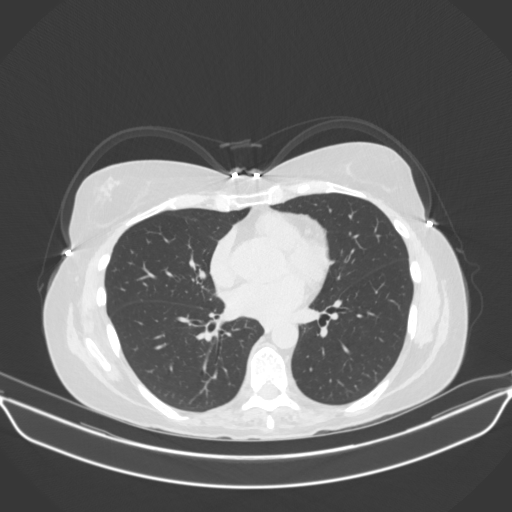
[im 84/160  lung]
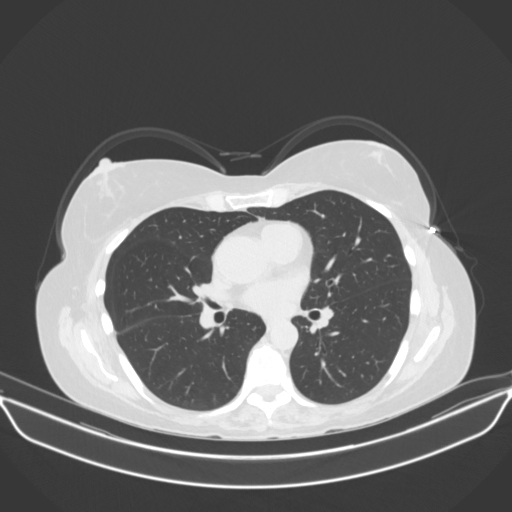
[im 99/160  lung]
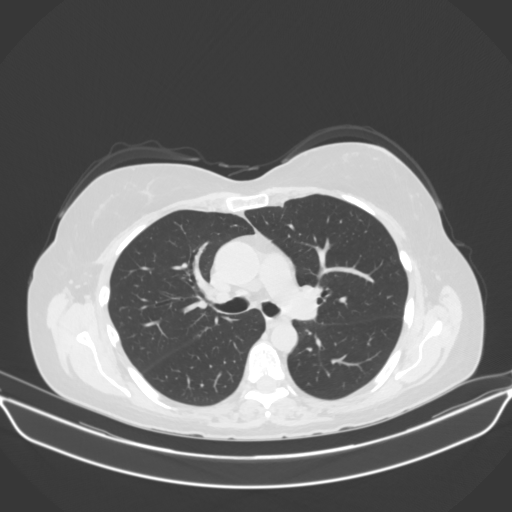
[im 114/160  mediastinal]
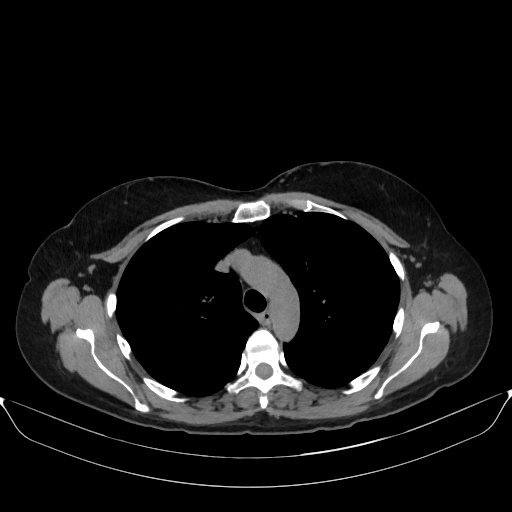
[im 114/160  lung]
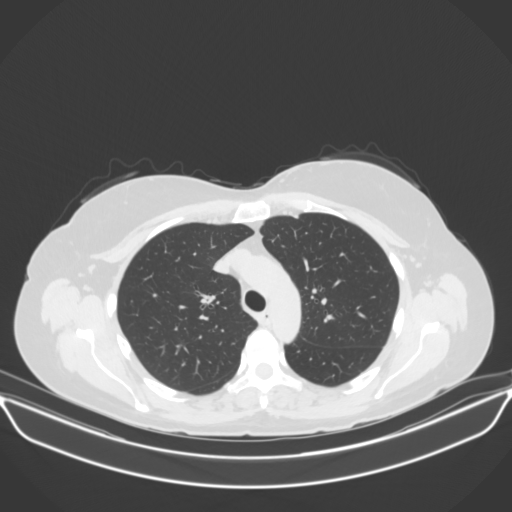
[im 122/160  lung]
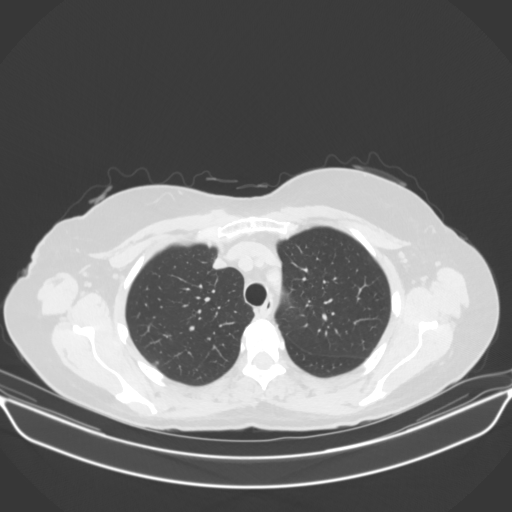
[im 137/160  lung]
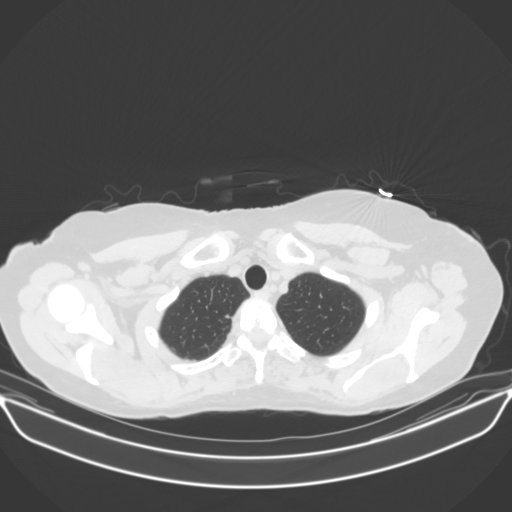
[im 152/160  lung]
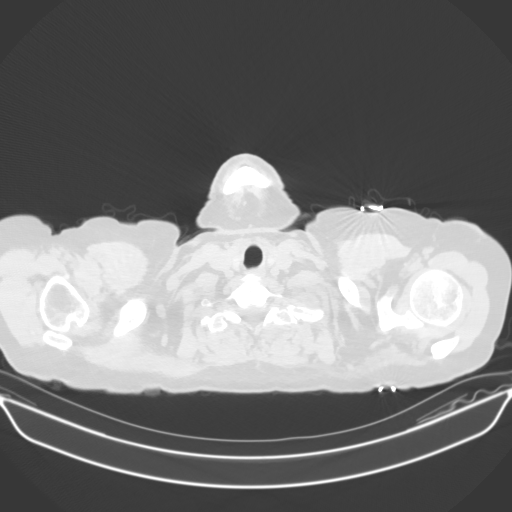

[Series 5: coronal · coronal · 0.62mm/px · 3 of 109 slices shown]
[im 22/109  lung]
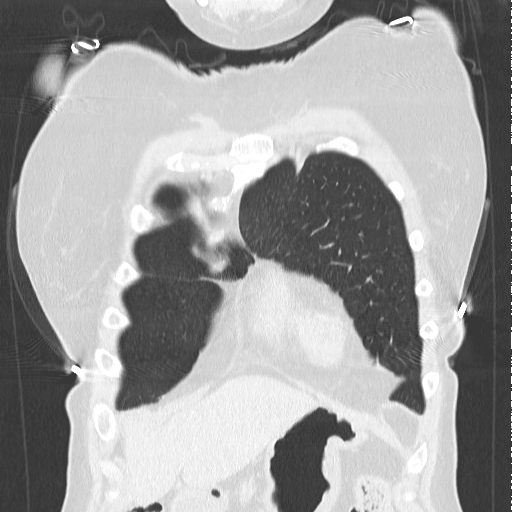
[im 44/109  lung]
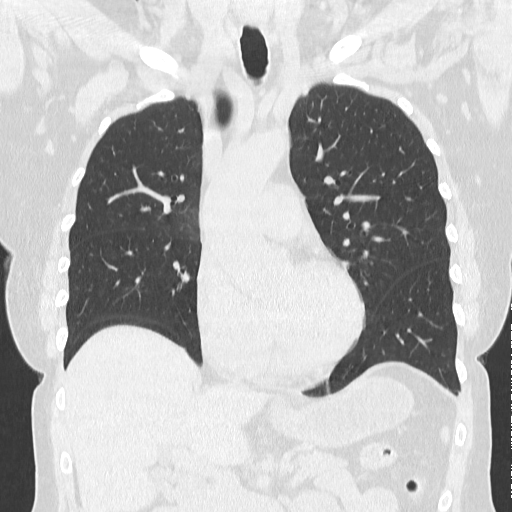
[im 65/109  lung]
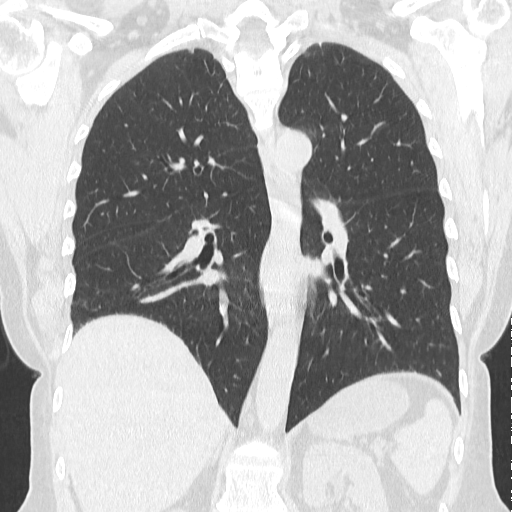

[15 of 36 positions shown; findings below may reference images not displayed]

FINDINGS: LUNGS AND PLEURA:  Resolution of the small groundglass opacities on prior study. 
Stable several scattered noncalcified pulmonary nodules, the largest of which 
measure 2.6 x 1.8 mm in the RUL (sequence 3 image 26) and 5.4 x 1.4 mm in the 
RML (image 98). Minimal biapical and scattered peripheral fibrosis. Minimal 
basilar atelectasis. No pleural effusion.  
MEDIASTINUM:  No adenopathy. Normal heart size. No pericardial effusion. 
CHEST WALL/AXILLA: No mass or adenopathy.  
UPPER ABDOMEN: Small hiatal hernia. Gallbladder is partially contracted with 
wall measuring 4 mm. 
MUSCULOSKELETAL: Degenerative change and osteopenia. No acute abnormality.
IMPRESSION: 1.  Resolution of the small groundglass opacities on prior study.  
2.  Stable several scattered noncalcified pulmonary nodules (up to 5.4 x
mm), minimal fibrosis and basilar atelectasis.  
3.  Small hiatal hernia.  
4.  Degenerative change and osteopenia. 
RADIATION DOSE REDUCTION: All CT scans are performed using radiation dose 
reduction techniques, when applicable.  Technical factors are evaluated and 
adjusted to ensure appropriate moderation of exposure.  Automated dose 
management technology is applied to adjust the radiation doses to minimize 
exposure while achieving diagnostic quality images.

## 2020-11-14 DIAGNOSIS — R918 Other nonspecific abnormal finding of lung field: Secondary | ICD-10-CM | POA: Insufficient documentation

## 2020-12-31 ENCOUNTER — Telehealth: Payer: Self-pay

## 2020-12-31 NOTE — Telephone Encounter (Signed)
Pt called to confirm appt with Ila on September 30th. Pt states that she will attend.

## 2021-01-22 ENCOUNTER — Encounter: Payer: Self-pay | Admitting: Surgery

## 2021-01-22 DIAGNOSIS — Z780 Asymptomatic menopausal state: Secondary | ICD-10-CM

## 2021-01-22 DIAGNOSIS — N63 Unspecified lump in unspecified breast: Secondary | ICD-10-CM

## 2021-01-23 ENCOUNTER — Encounter: Payer: Self-pay | Admitting: Surgery

## 2021-01-23 ENCOUNTER — Encounter: Payer: Self-pay | Admitting: Gastroenterology

## 2021-01-23 NOTE — Telephone Encounter (Signed)
This patient is calling wondering if Ila has seen her most recent my chart message, I told her I would forward it to [place] again and provided her with the [facility] phone number

## 2021-01-23 NOTE — Telephone Encounter (Signed)
Incoming call from patient asking for Ila to return her message.  Advised that Ila is seeing patients and would return her message when able.    Electronically signed by Kandis Fantasia, RNC-OB, 01/23/21 1:33 PM

## 2021-01-23 NOTE — Addendum Note (Signed)
Addended byLacretia Nicks on: 01/23/2021 01:59 PM     Modules accepted: Orders

## 2021-01-26 NOTE — Telephone Encounter (Signed)
Requisition faxed to [facility].    Electronically signed by Kandis Fantasia, RNC-OB, 01/26/21 10:20 AM

## 2021-02-06 ENCOUNTER — Other Ambulatory Visit: Payer: Self-pay

## 2021-02-06 ENCOUNTER — Encounter: Payer: Self-pay | Admitting: Gastroenterology

## 2021-02-09 DIAGNOSIS — C50912 Malignant neoplasm of unspecified site of left female breast: Secondary | ICD-10-CM | POA: Insufficient documentation

## 2021-02-10 ENCOUNTER — Telehealth: Payer: Self-pay

## 2021-02-10 NOTE — Telephone Encounter (Signed)
Incoming fax from [facility] requesting a prior authorization for a breast MRI.  Call to [facility].    Authorization # [id number]  Valid 02/16/2021-05/17/2021    Call to [facility] with approval information.    Electronically signed by Kandis Fantasia, RNC-OB, 02/10/21 1:43 PM

## 2021-02-11 ENCOUNTER — Telehealth: Payer: Self-pay | Admitting: Surgery

## 2021-02-11 NOTE — Telephone Encounter (Signed)
Phone call to patient.  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  She has an MRI appointment on Monday, 02/16/2021, and an appointment with Dr. Illene Labrador, surgeon, on Tuesday, 02/17/2021.  She is currently here from [place] and staying with her mother.

## 2021-02-16 ENCOUNTER — Other Ambulatory Visit: Payer: Self-pay | Admitting: Gastroenterology

## 2021-02-27 ENCOUNTER — Encounter: Payer: Self-pay | Admitting: Surgery

## 2021-02-27 ENCOUNTER — Other Ambulatory Visit: Payer: Self-pay

## 2021-02-27 ENCOUNTER — Ambulatory Visit: Payer: BLUE CROSS/BLUE SHIELD | Attending: Surgery | Admitting: Surgery

## 2021-02-27 VITALS — BP 120/80 | HR 80 | Temp 97.9°F | Ht 65.0 in | Wt 136.4 lb

## 2021-02-27 DIAGNOSIS — Z124 Encounter for screening for malignant neoplasm of cervix: Secondary | ICD-10-CM | POA: Insufficient documentation

## 2021-02-27 DIAGNOSIS — Z780 Asymptomatic menopausal state: Secondary | ICD-10-CM | POA: Insufficient documentation

## 2021-02-27 DIAGNOSIS — E039 Hypothyroidism, unspecified: Secondary | ICD-10-CM | POA: Insufficient documentation

## 2021-02-27 DIAGNOSIS — B009 Herpesviral infection, unspecified: Secondary | ICD-10-CM | POA: Insufficient documentation

## 2021-02-27 DIAGNOSIS — C50919 Malignant neoplasm of unspecified site of unspecified female breast: Secondary | ICD-10-CM | POA: Insufficient documentation

## 2021-02-27 DIAGNOSIS — Z1589 Genetic susceptibility to other disease: Secondary | ICD-10-CM | POA: Insufficient documentation

## 2021-02-27 DIAGNOSIS — Z01419 Encounter for gynecological examination (general) (routine) without abnormal findings: Secondary | ICD-10-CM | POA: Insufficient documentation

## 2021-02-27 NOTE — Patient Instructions (Signed)
[facility] in [place]    Get Bone density scan in 6 months.

## 2021-02-27 NOTE — Progress Notes (Signed)
GYN ANNUAL EXAM:     [name] is a 65 y.o. G33P1101 postmenopausal female who presents for an annual GYN exam. The patient has just been recently diagnosed with breast cancer.  It is ER and PR positive per the patient.   She is scheduled for Breast Cancer surgery on 03/11/2021.  She will then get her radiation done in [place].   She typically lives in the [place], [place].  She is up here visiting family.  She used to live in the [place] area.  Her last office visit was here for an annual visit on 03/21/2019.  She is currently on compounded Biest with Testosterone prescribed by a Functional NP in [place].  She is on Prometrium daily.   She is worried about stopping her HRT.   Her Breast surgeon, Dr. Marcelline Deist, would like her to stop the Prometrium.  She has not talked to her Functional Medicine NP about the Breast Cancer diagnosis yet.   She did have genetic testing done at the [facility].  The results are pending.   She was diagnosed with mild Pulmonary Fibrosis in 10/2020.   She was given samples on Nurtec for her Migraines and has used it occasionally.  She has not had any issues in some time with these.   She also has samples of Ubrelvy to try.     GYN History  LMP: Had an ablation.  LMP probably was around age 29. (?? 05/2008)  Last pap smear: Date: 01/20/2018   results: NILM with negative high risk HPV co-testing  History of abnormal pap smear: No.   The patient is sexually active.   Sexually active: single partner, contraception - post menopausal status  Together with partner: 45 years-married (2nd husband)  STD History: HSV 2  Current contraception: post menopausal status  HPV vaccine series: Not received    OB History   Gravida Para Term Preterm AB Living   2 2 1 1  0 1   SAB IAB Ectopic Multiple Live Births   0 0 0 0        # Outcome Date GA Lbr Len/2nd Weight Sex Delivery Anes PTL Lv   2 Preterm  [redacted]w[redacted]d             Complications: Abruptio Placenta   1 Term               Obstetric Comments   Her child born at 59  weeks died at 21 months old from Tracheal stenosis.       Screening History:  Last mammogram: Summer 2022 with subsequent diagnostic imaging done here on 02/06/2021 and Breast MRI on 02/16/2021- Left Breast Cancer-Invasive Ductal Carcinoma  Dexascan: 10/20/2018  Improved from previous DXA in 2013 that had shown Osteopenia.  Previous DXA to this one was in 12/2015.   Colonoscopy: 5 years ago (usually goes every 5 years)-Due this 05/2021.  Has a hx of Diverticulosis.   The patient is taking hormone replacement therapy. Patient denies post-menopausal vaginal bleeding.  The patient wears seatbelts:yes  The patient participates in regular exercise: yes  Has the patient ever had a blood transfusion?:no  Lives with her husband.  She is staying with her mother while she is in the [place] area.  Domestic violence:No    Depression Screening:       PHQ 9 Score: 0    Past Medical History:   Diagnosis Date    Acne     Actinic keratosis     Allergy history unknown  Anemia     Back pain     Resolved    Complication of anesthesia     Novacaine Dizzy    Depression     Dermatitis 02/09/2012    Dermatitis     Diverticulosis     GERD (gastroesophageal reflux disease)     Gout     Herpes     Genital    IBS (irritable bowel syndrome)     Inflammatory arthritis 03/07/2017    Lumbar stenosis     Migraine     Neuropathy     Pulmonary fibrosis     Recurrent sinus infections     STD (sexually transmitted disease)     Gential Herpes    Urticaria     Varicella         Past Surgical History:   Procedure Laterality Date    CESAREAN SECTION, CLASSIC  1985    ENDOMETRIAL ABLATION  2009    INCONTINENCE SURGERY  2010    NASAL POLYP SURGERY  2003    RECTOCELE REPAIR  2010    SPHINCTEROPLASTY  2010    ABSCESS AFTER SURGERY        Allergies   Allergen Reactions    Amoxicillin Hives    Demerol Nausea And Vomiting    Environmental Allergies     Erythromycin      ABDOMINAL PAIN    Gluten Meal      BLOATING, SNEEZING, WATERY  EYES, NEUROPATHY FLARE    Sulfa Antibiotics Hives    Plaquenil [Hydroxychloroquine] Other (See Comments)     Blurred vision        Current Outpatient Medications on File Prior to Visit   Medication Sig Dispense Refill    fluticasone-salmeterol (ADVAIR/WIXELA) 100-50 mcg/dose diskus inhaler Advair Diskus 100 mcg-50 mcg/dose powder for inhalation      methylPREDNISolone (MEDROL) 4 MG tablet methylprednisolone 4 mg tablets in a dose pack      tiZANidine (ZANAFLEX) 4 mg tablet       Liotrix 15 (3.1-12.5) MG (MCG) TABS t3 57mcg sr capsules   TAKE ONE CAPSULE BY MOUTH EVERY MORNING      Cholecalciferol (VITAMIN D3 PO) Take by mouth  Drops 5,000 units po daily      testosterone POWD By no specified route  Estriol 3 mg + E 2 2 mg +4.5 mg/ml Smooth White Cream   Apply 2 clicks BID   Disp 90 ml   (3 month supply is 3 containers)     [facility]   [address]  [place], [place] [address]  [phone]      levothyroxine (SYNTHROID, LEVOTHROID) 50 mcg tablet TAKE 1 TABLET BY MOUTH DAILY (BEFORE BREAKFAST) 90 tablet 2    Multiple Vitamin (MULTI-VITAMIN DAILY PO) Take by mouth      mycophenolate (CELLCEPT) 500 MG tablet Take 1,000 mg by mouth 2 times daily      DULoxetine (CYMBALTA) 60 MG DR capsule Take 60 mg by mouth daily      omeprazole (PRILOSEC) 40 MG capsule Take 40 mg by mouth daily   BEFORE A MEAL  1    gabapentin (GABAPENTIN) 300 MG capsule Take 3 capsules (900 mg total) by mouth nightly 270 capsule 3    cetirizine (ZYRTEC) 10 MG tablet Take 10 mg by mouth daily      Probiotic Product (PROBIOTIC FORMULA PO) Take 1 capsule by mouth daily      DIGESTIVE ENZYMES PO Take 1  capsule by mouth daily      Cyanocobalamin (VITAMIN B-12) 2000 MCG TBCR       valACYclovir (VALTREX) 1 GM tablet TAKE 1 TABLET DAILY 90 tablet 2    tiZANidine (ZANAFLEX) 4 MG tablet Take 8 mg by mouth nightly as needed  AT BEDTIME FOR MUSCLE SPASMS  2    HYDROcodone-acetaminophen (NORCO) 5-325 MG per tablet  Take 1 tablet by mouth every 6 hours as needed for Pain   Max daily dose: 4 tablets 28 tablet 0    BREO ELLIPTA 100-25 MCG/INH inhaler One inhalation a day  2    PROAIR HFA 108 (90 BASE) MCG/ACT inhaler Inhale 2 puffs into the lungs every 4-6 hours as needed  2     No current facility-administered medications on file prior to visit.       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    Review of Systems  Pertinent items are noted in HPI.    History of tremors     Objective:     BP 120/80    Pulse 80    Temp 36.6 C (97.9 F)    Ht 1.651 m (5\' 5" )    Wt 61.9 kg (136 lb 6.4 oz)    BMI 22.70 kg/m   General appearance: alert, appears stated age and cooperative  Neck: no adenopathy, supple, symmetrical, trachea midline and thyroid not enlarged, symmetric, no tenderness/mass/nodules  Lungs: clear to auscultation bilaterally  Breasts: Inspection negative, No nipple retraction or dimpling, No nipple  discharge or bleeding, thickened tissue around upper midline of left breast  Heart: regular rate and rhythm, S1, S2 normal, no murmur, click, rub or gallop  Abdomen: soft, non-tender; bowel sounds normal; no masses,  no organomegaly  Pelvic: cervix normal in appearance, external genitalia normal, no adnexal masses or tenderness, no cervical motion tenderness, positive findings: irregular cervical os, no friability, rectovaginal septum normal, uterus normal size, shape, and consistency and vagina normal without discharge  Extremities: extremities normal, atraumatic, no cyanosis or edema  Skin: Skin color, texture, turgor normal. No rashes or lesions  Lymph nodes: Cervical, supraclavicular, and axillary nodes normal.          Assessment:     [name] is a 65 y.o. female who presents for an  annual GYN exam. She has the following concerns:   1. Well woman exam with routine gynecological exam  GYN Cytology    GYN Cytology   2. Postmenopausal state  GYN Cytology    GYN Cytology   3. Pap smear for cervical cancer screening  GYN Cytology    GYN Cytology   4. Breast cancer      Left   5. 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 Well woman exam with routine gynecological exam  Recommend she get a repeat Dexascan in 6 months or so and after she is done with radiation.   - GYN Cytology; Future  - GYN Cytology    2. Postmenopausal state  Discussed weaning off her HRT.  If she stays on her Estrogen/Testosterone compounded HRT but stops Prometrium daily, concern for endometrial thickening discussed.  Would consider doing Prometrium q 3 months for 2 weeks.   Would like her off HRT while she is doing Breast Cancer treatments.   Risks/benefits of HRT  Usage and Breast Cancer discussed.   - GYN Cytology; Future  - GYN Cytology    3. Pap smear for cervical cancer screening    - GYN Cytology; Future  - GYN Cytology    4. Breast cancer  Await  upcoming surgery.     5. Hypothyroidism  Patient to send copies of her recent labs.     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   Return to care 1 year or PRN.    Blenda Mounts, PA  02/27/2021

## 2021-02-28 ENCOUNTER — Other Ambulatory Visit: Payer: Self-pay | Admitting: Surgery

## 2021-03-02 ENCOUNTER — Encounter: Payer: Self-pay | Admitting: Gastroenterology

## 2021-03-02 NOTE — Telephone Encounter (Signed)
Nursing Phone Triage Note:    History:  [place] requests a refill on   levothyroxine (SYNTHROID, LEVOTHROID) 50 mcg tablet 90 tablet 2 06/24/2020     Sig: TAKE 1 TABLET BY MOUTH DAILY (BEFORE BREAKFAST)    Sent to pharmacy as: levothyroxine (SYNTHROID, LEVOTHROID) 50 mcg tablet        Chart review:  The last time this prescription was written was 06/24/2020 by Ila G. Lorelee Market  .    If a monthly prescription, patient is due for her next prescription on 03/24/2021.  The plan for follow-up with regard to this medicine at the patient's last visit was AGY.    The patient's last annual exam: 02/27/2021.    Allergies confirmed with the patient:  Allergies   Allergen Reactions    Amoxicillin Hives    Demerol Nausea And Vomiting    Environmental Allergies     Erythromycin      ABDOMINAL PAIN    Gluten Meal      BLOATING, SNEEZING, WATERY EYES, NEUROPATHY FLARE    Sulfa Antibiotics Hives    Plaquenil [Hydroxychloroquine] Other (See Comments)     Blurred vision       Counseling, Education, and Plan:  Patient is due/overdue for annual care.    Annual examination scheduled on NOT NEEDED.    Phone encounter routed to providers to consider medication coverage until the time of the patient's next visit.    Routed to [facility] for orders.      Kandis Fantasia, RN  03/02/2021  12:45 PM

## 2021-03-03 ENCOUNTER — Encounter: Payer: Self-pay | Admitting: Gastroenterology

## 2021-03-03 ENCOUNTER — Encounter: Payer: Self-pay | Admitting: Surgery

## 2021-03-03 DIAGNOSIS — Z79890 Hormone replacement therapy (postmenopausal): Secondary | ICD-10-CM

## 2021-03-03 DIAGNOSIS — Z780 Asymptomatic menopausal state: Secondary | ICD-10-CM

## 2021-03-03 DIAGNOSIS — N951 Menopausal and female climacteric states: Secondary | ICD-10-CM

## 2021-03-03 MED ORDER — PROGESTERONE 100 MG PO CAPS *I*
100.0000 mg | ORAL_CAPSULE | Freq: Every day | ORAL | 1 refills | Status: DC
Start: 2021-03-03 — End: 2021-03-04

## 2021-03-04 MED ORDER — PROGESTERONE 100 MG PO CAPS *I*
100.0000 mg | ORAL_CAPSULE | Freq: Every day | ORAL | 1 refills | Status: DC
Start: 2021-03-04 — End: 2022-02-24

## 2021-03-04 NOTE — Addendum Note (Signed)
Addended by: Jacqualyn Posey on: 03/04/2021 11:24 AM     Modules accepted: Orders

## 2021-03-04 NOTE — Telephone Encounter (Signed)
Progesterone sent to incorrect pharmacy, pt. Would like progesterone re-sent to [facility] [address]. Pharmacy updated per request.     Consuello Closs, RN, AMB-BC, CLC  [facility]  03/04/2021  11:23 AM

## 2021-03-06 LAB — UNMAPPED LAB RESULTS
Hematocrit (HT): 41 % (ref 34–47)
Hemoglobin (HGB) (HT): 13.7 g/dL (ref 11.5–16.0)
MCHC (HT): 33.3 g/dL (ref 32.0–36.0)
MCV (HT): 94.1 fL (ref 81.0–99.0)
Mean Corpuscular Hemoglobin (MCH) (HT): 31.4 pg (ref 26.0–34.0)
Platelets (HT): 346 10 3/uL (ref 140–400)
RBC (HT): 4.37 10 6/uL (ref 3.80–5.20)
RDW (HT): 13.4 % (ref 11.5–15.0)
WBC (HT): 7.2 10 3/uL (ref 4.0–10.8)

## 2021-03-11 DIAGNOSIS — Z9012 Acquired absence of left breast and nipple: Secondary | ICD-10-CM | POA: Insufficient documentation

## 2021-03-11 LAB — GYN CYTOLOGY

## 2021-03-11 NOTE — Procedures (Signed)
 Surgery Brief Op NotePatient Information:[name]     MRN: [id number] y.o. Female    DOB: [birth year]Procedure(s):MASTECTOMY, PARTIAL, WITH SENTINEL LYMPH NODE BIOPSY, NEEDLE LOC AT B&IBLOCK, NERVE, PECTORALPre-op Diagnosis: C50.912 LEFT BREAST CANCERPost-op Diagnosis: Left breast cancerFindings: sentinel node excised, left breast tissue excised with needle localizationComplications: noSurgeon: Surgeon(s) and Role:   * Meliton Lonni NOVAK, MD - PrimaryAssistants: Vernell Rains, PA-C, Velinda Repress, PA-CAnesthesia:Type: General, localAnesthesiologist: Anesthesiologist: Starr Chiquita Cumming, MDEstimated Blood Loss: 25 mLFluids: 1000 mLUOP: N/A       Drains: noneCondition: stable          Specimens Removed:ID Type Source Tests Collected by Time Destination 1 : Left partial mastectomy; short superior, long lateral; Please ink margins Surgical Pathology Specimen Breast, Left (partial) SURGICAL PATHOLOGY SPECIMEN Meliton Lonni NOVAK, MD 03/11/2021 1530  2 : Level 1, hot spot 1, green dye: yes, count: 385 Surgical Pathology Specimen Lymph Node, Sentinel, Left Axilla SURGICAL PATHOLOGY SPECIMEN Meliton Lonni NOVAK, MD 03/11/2021 1536  Tourniquet: N/AWound closed with: Skin glueElectronically signed by Vernell Rains, PA-C, 4:11 PM 03/11/21

## 2021-03-12 ENCOUNTER — Encounter: Payer: Self-pay | Admitting: Surgery

## 2021-03-12 LAB — SURGICAL PATHOLOGY

## 2021-05-13 ENCOUNTER — Other Ambulatory Visit: Payer: Self-pay | Admitting: Gastroenterology

## 2021-08-03 IMAGING — DX ANKLE 3 VIEWS LEFT
1 series · 3 of 3 positions shown · non-contrast
Comparison: none

________________________________________________________________________________________________ 
ANKLE 3 VIEWS LEFT, 08/03/2021 [DATE]: 
CLINICAL INDICATION: Left ankle pain 
COMPARISON EXAMINATIONS: None

[Series 1: AP · U · 0.14mm/px · 3 of 3 slices shown]
[im 1/3]
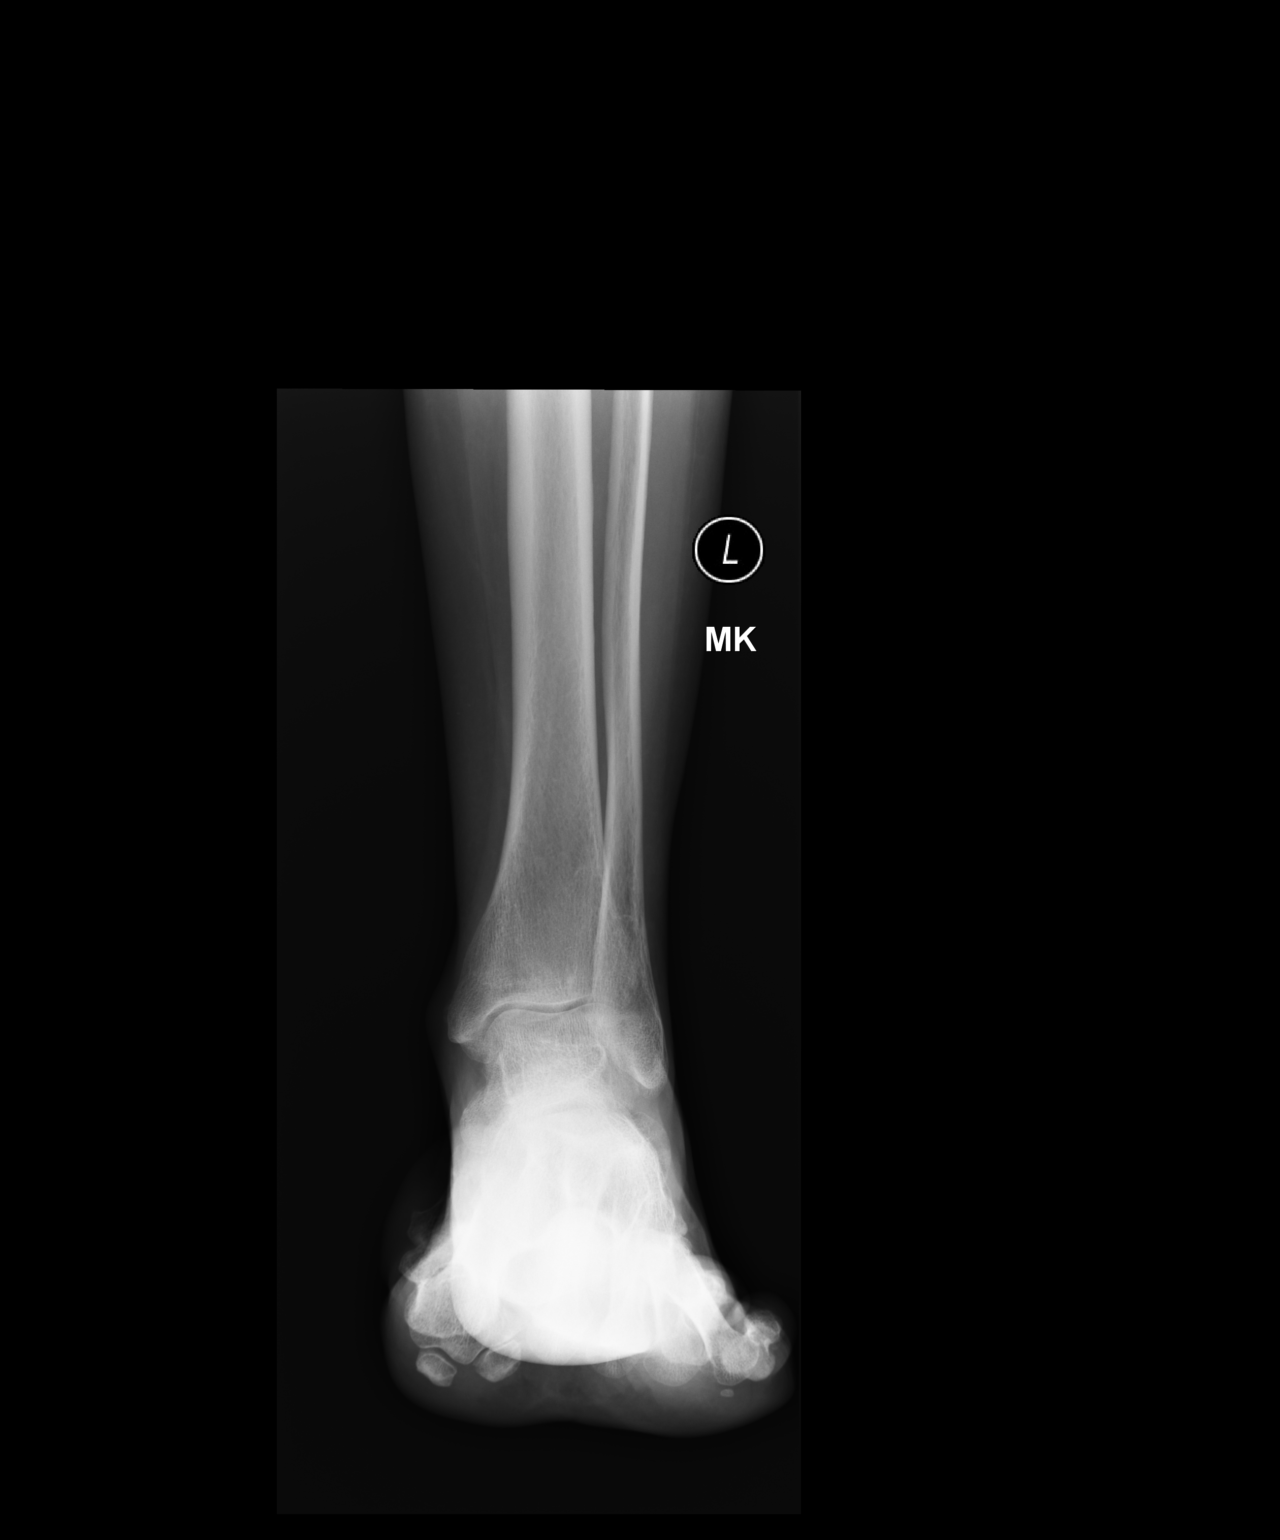
[im 2/3]
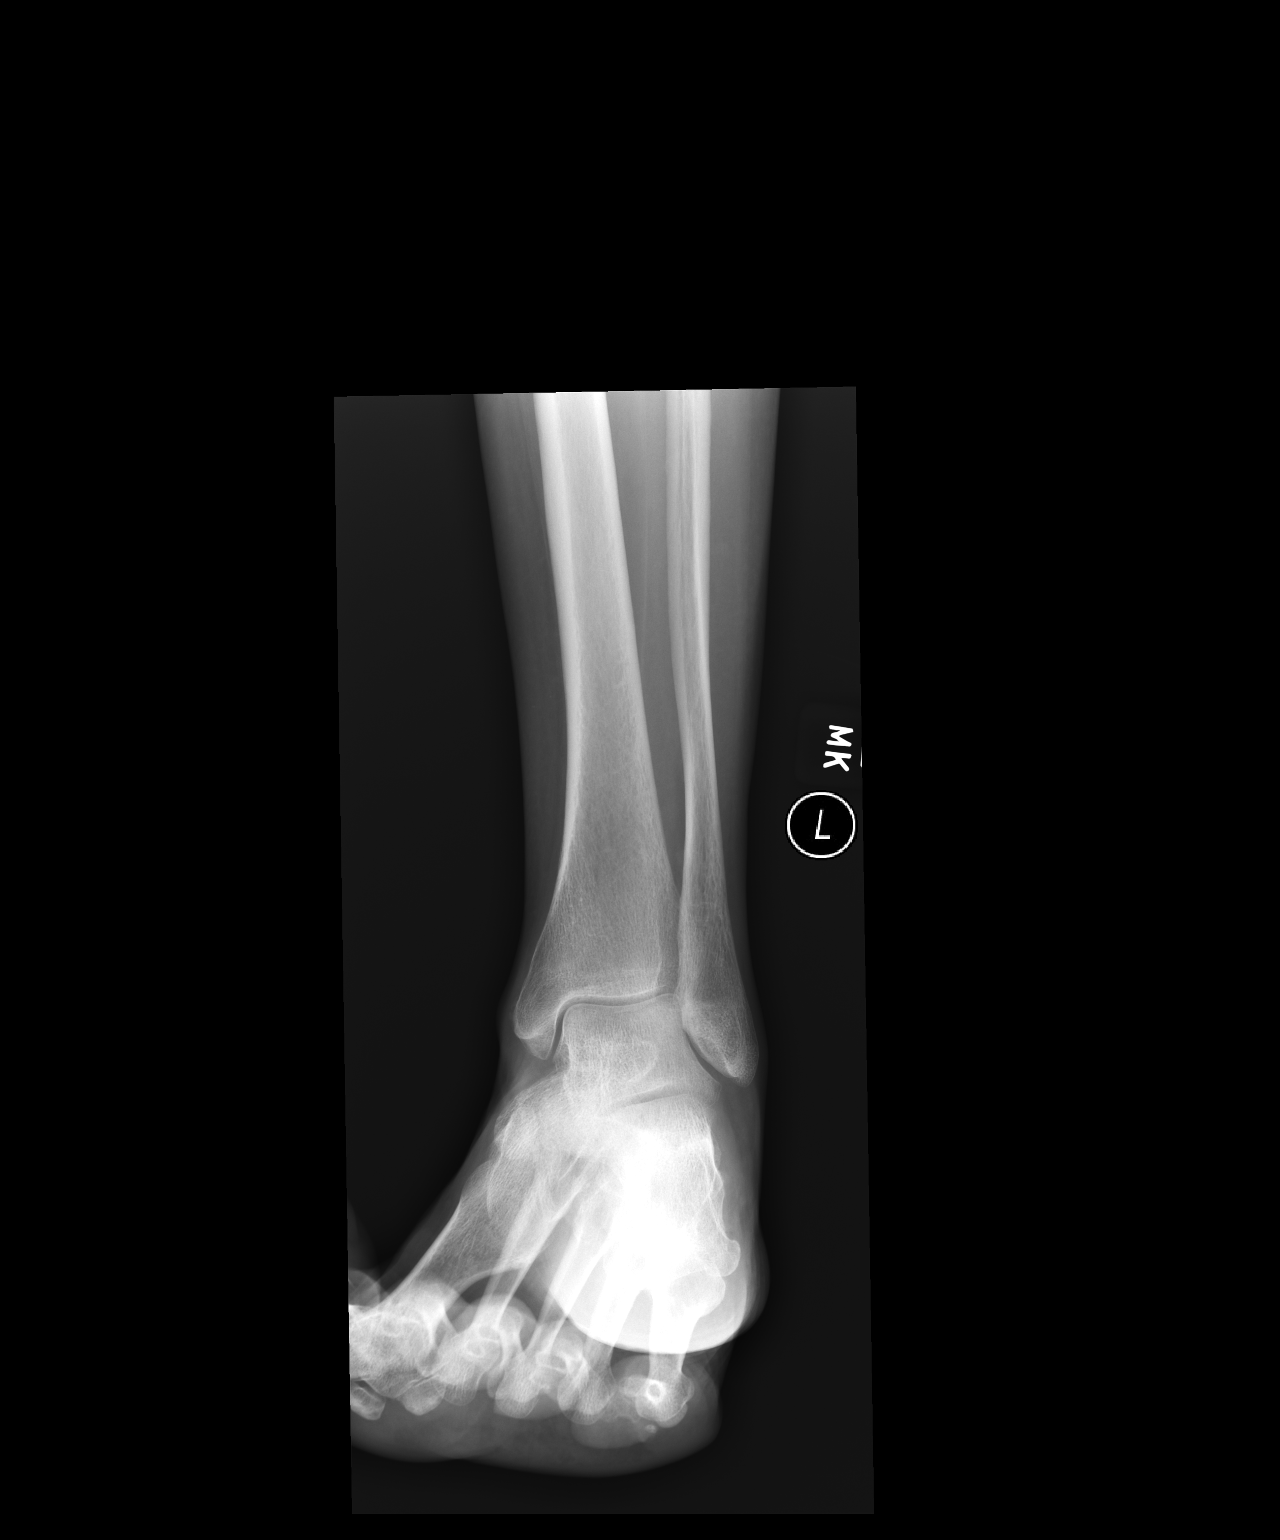
[im 3/3]
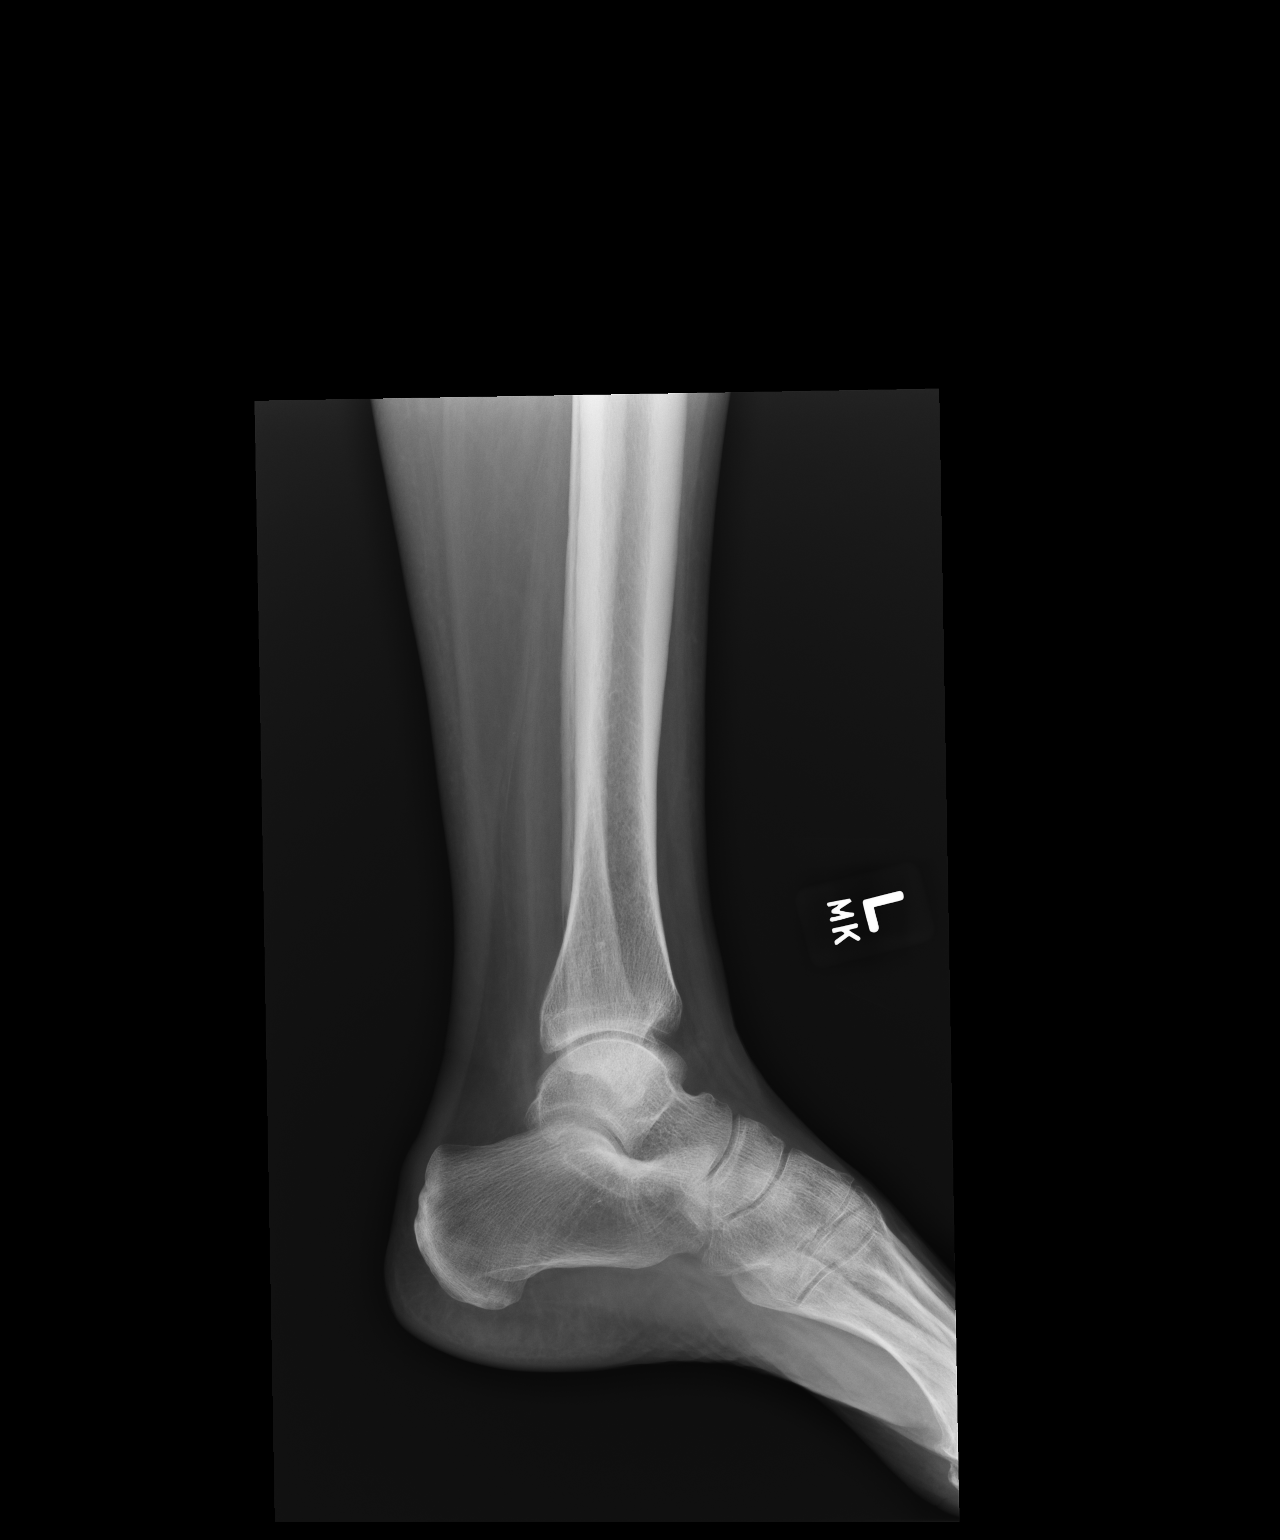

[3 of 3 positions shown; findings below may reference images not displayed]

FINDINGS: No fracture. Normal alignment. Talar dome is preserved. Osteopenia. 
Mild medial ankle soft tissue swelling.
IMPRESSION: 1.  Joint spaces are preserved. 
2.  Mild medial ankle soft tissue swelling.

## 2021-08-03 IMAGING — DX HAND 3 VIEWS LEFT
1 series · 3 of 3 positions shown · non-contrast
Comparison: None.

________________________________________________________________________________________________ 
HAND 3 VIEWS LEFT, 08/03/2021 [DATE]: 
CLINICAL INDICATION: Pain in left fingers

[Series 1: PA · U · 0.14mm/px · 3 of 3 slices shown]
[im 1/3]
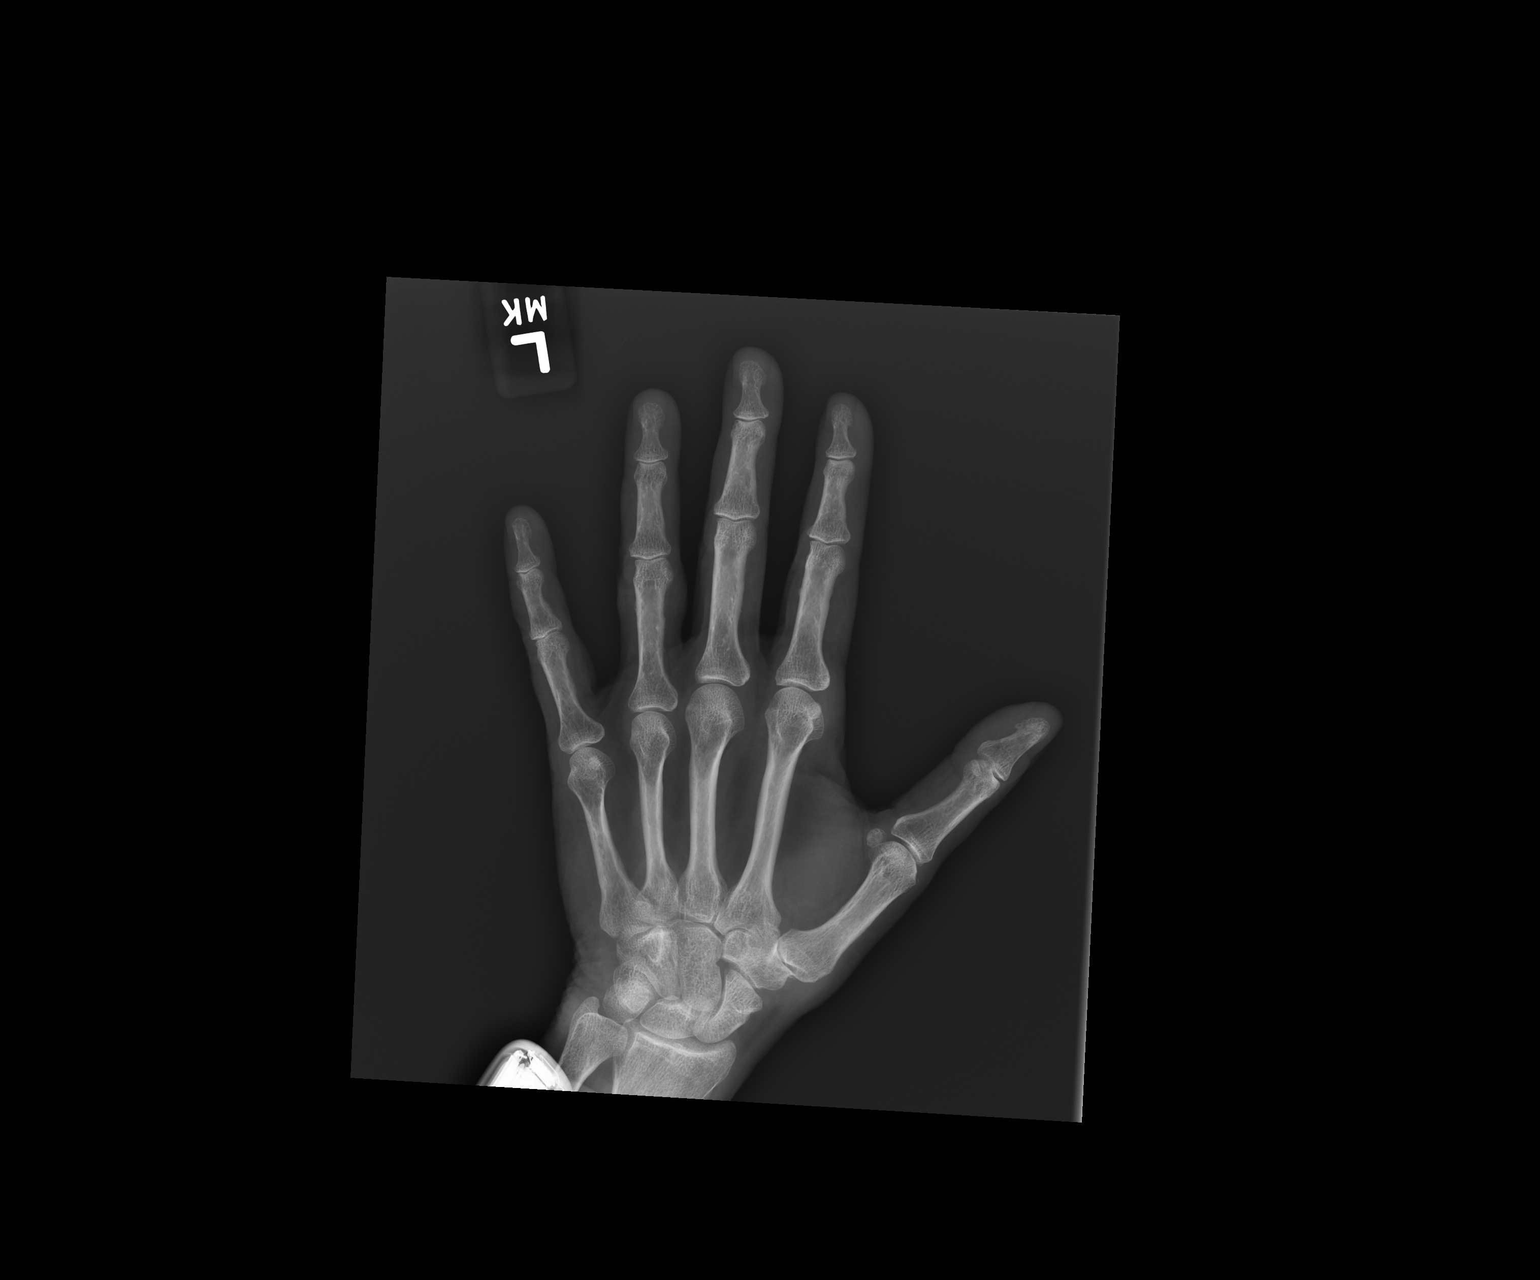
[im 2/3]
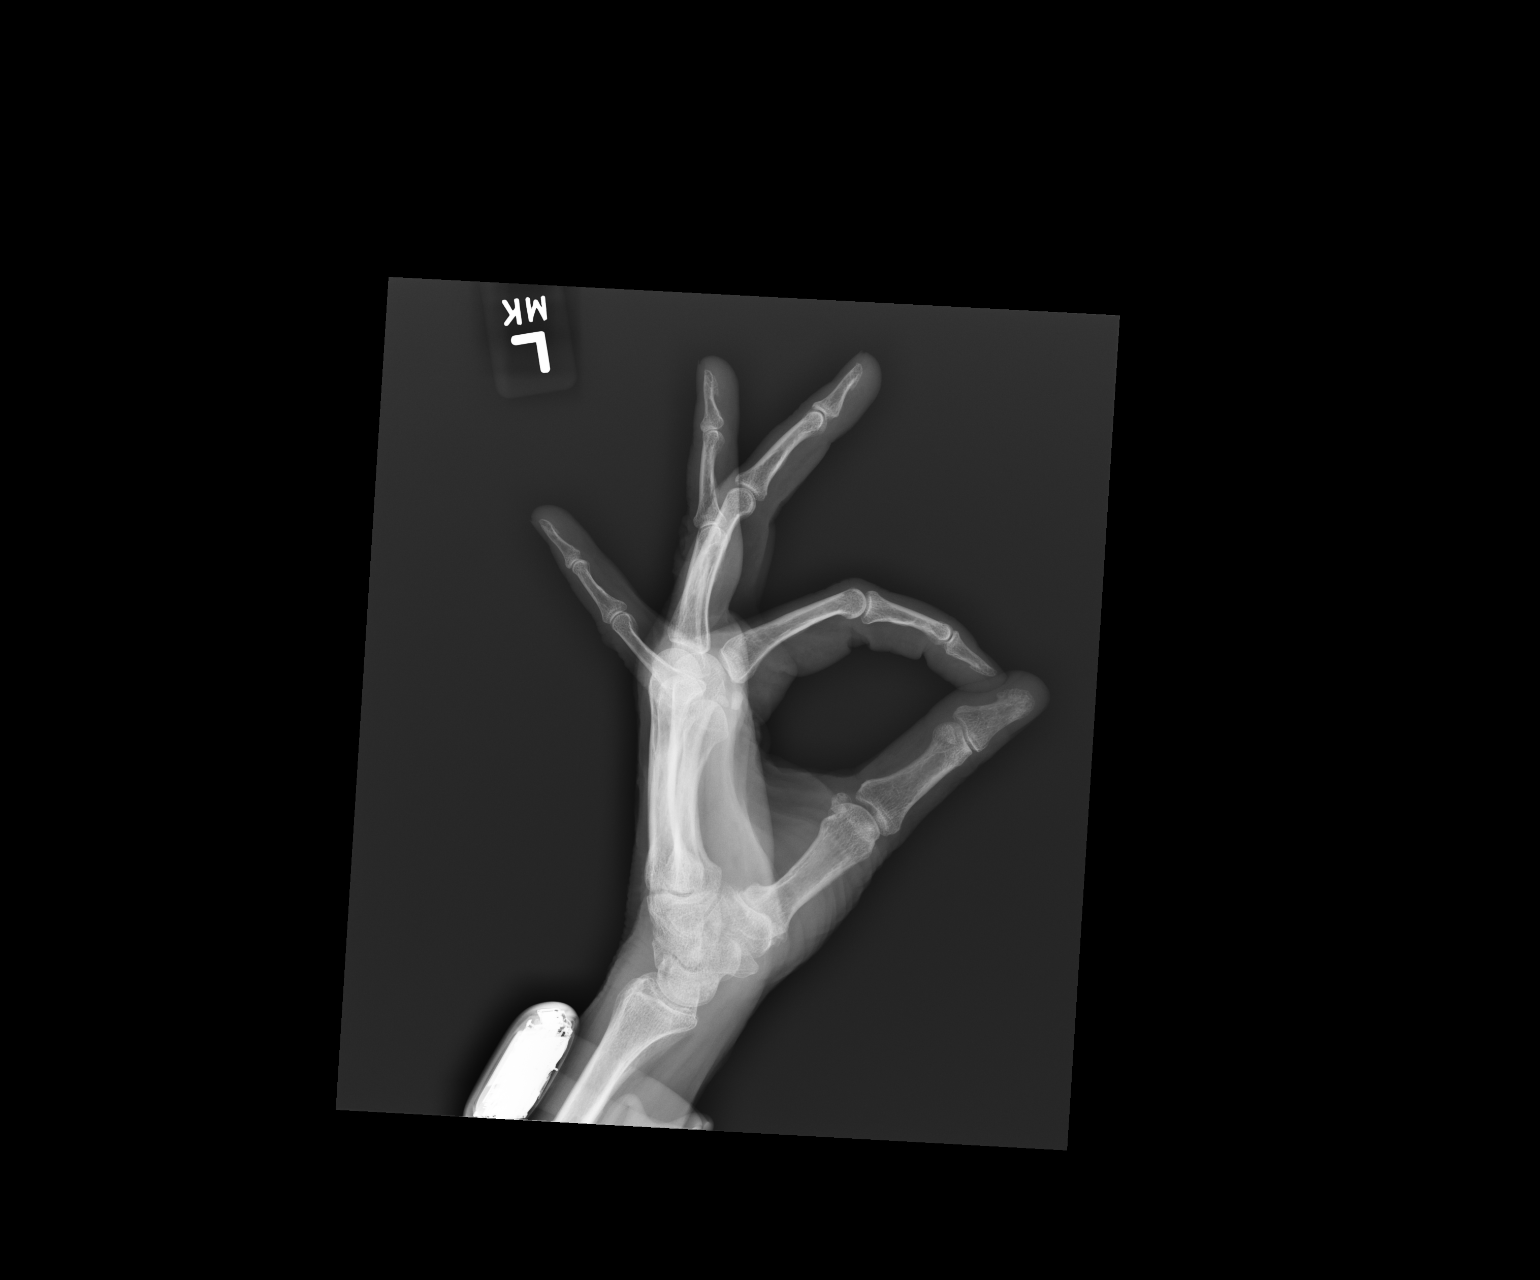
[im 3/3]
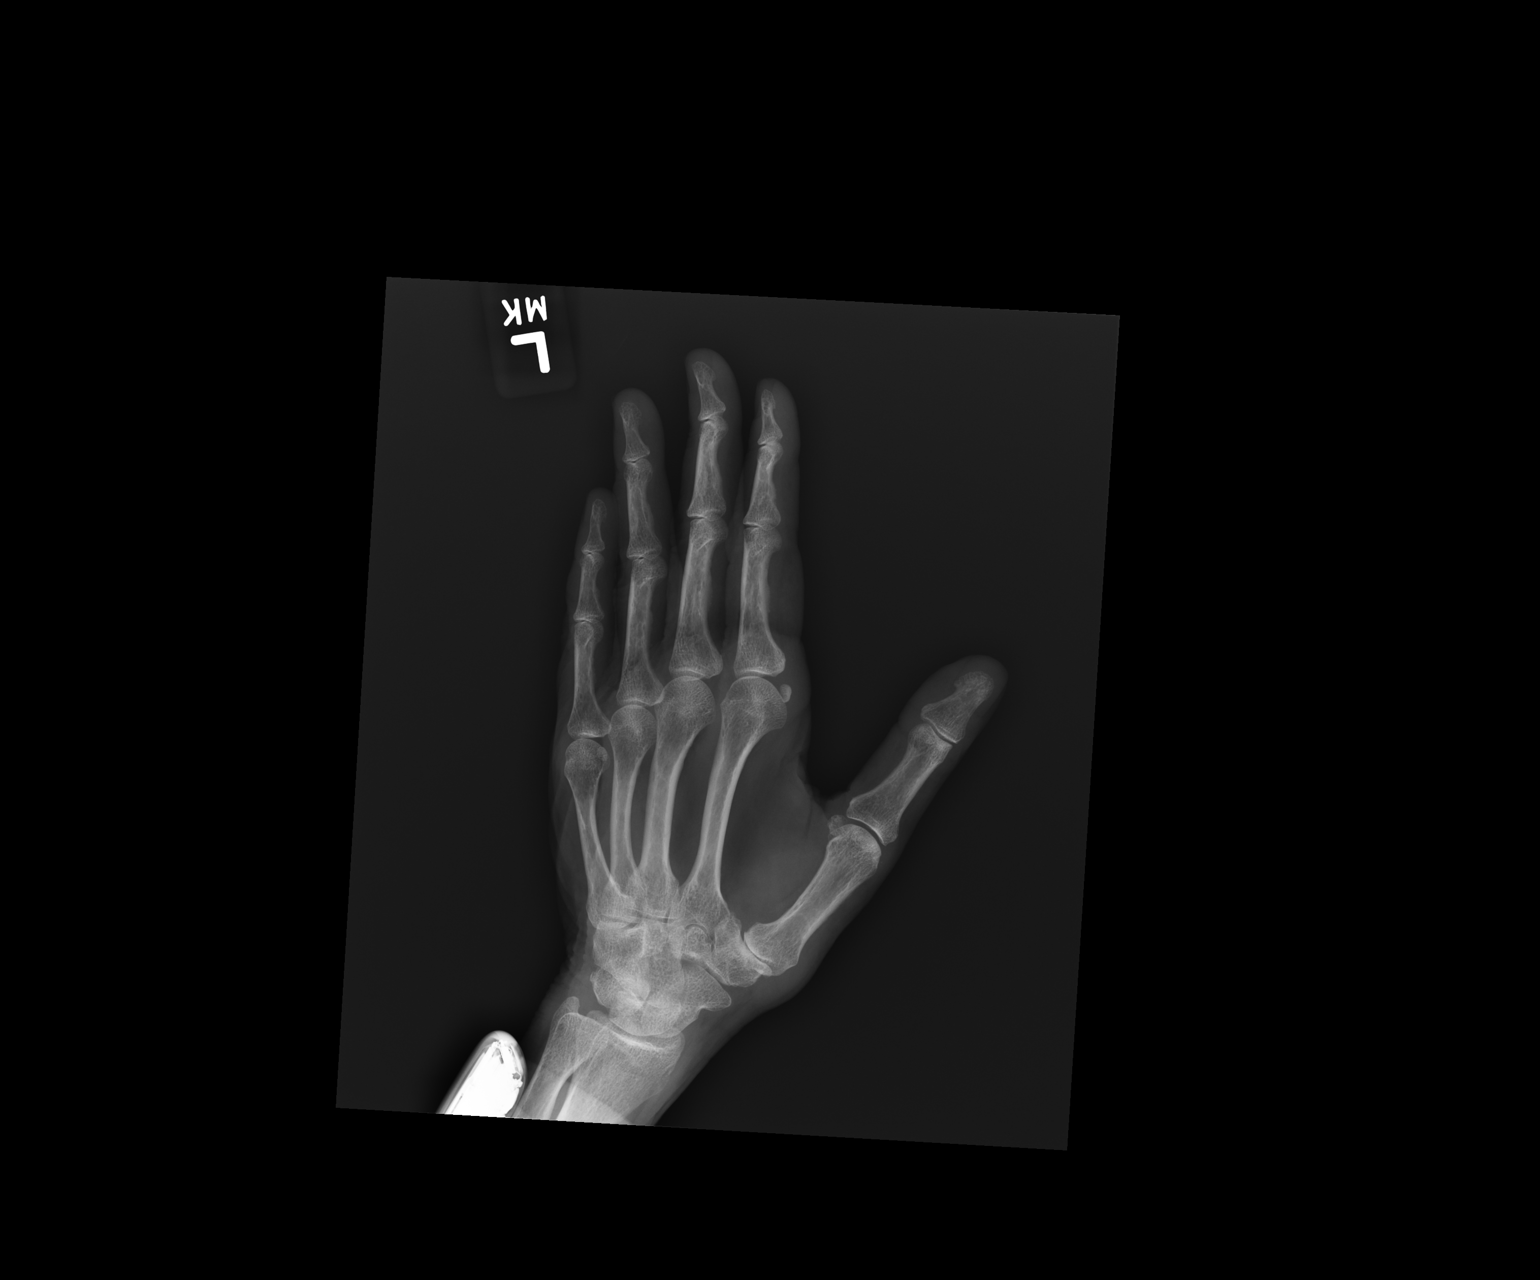

[3 of 3 positions shown; findings below may reference images not displayed]

FINDINGS: No fractures or dislocations. Moderate degenerative change of the 
thumb carpometacarpal (O1O-H) joint. Remaining joints are preserved. Osteopenia. 
No soft tissue swelling.
IMPRESSION: Moderate degenerative change of the O1O-H joint.

## 2021-08-03 IMAGING — DX HIP 2 VIEWS LEFT WITH PELVIS
1 series · 3 of 3 positions shown · non-contrast
Comparison: 07/18/2020 CT pelvis

________________________________________________________________________________________________ 
HIP 2 VIEWS LEFT WITH PELVIS, 08/03/2021 [DATE]: 
CLINICAL INDICATION: Left hip pain

[Series 1: AP · U · 0.14mm/px · 3 of 3 slices shown]
[im 1/3]
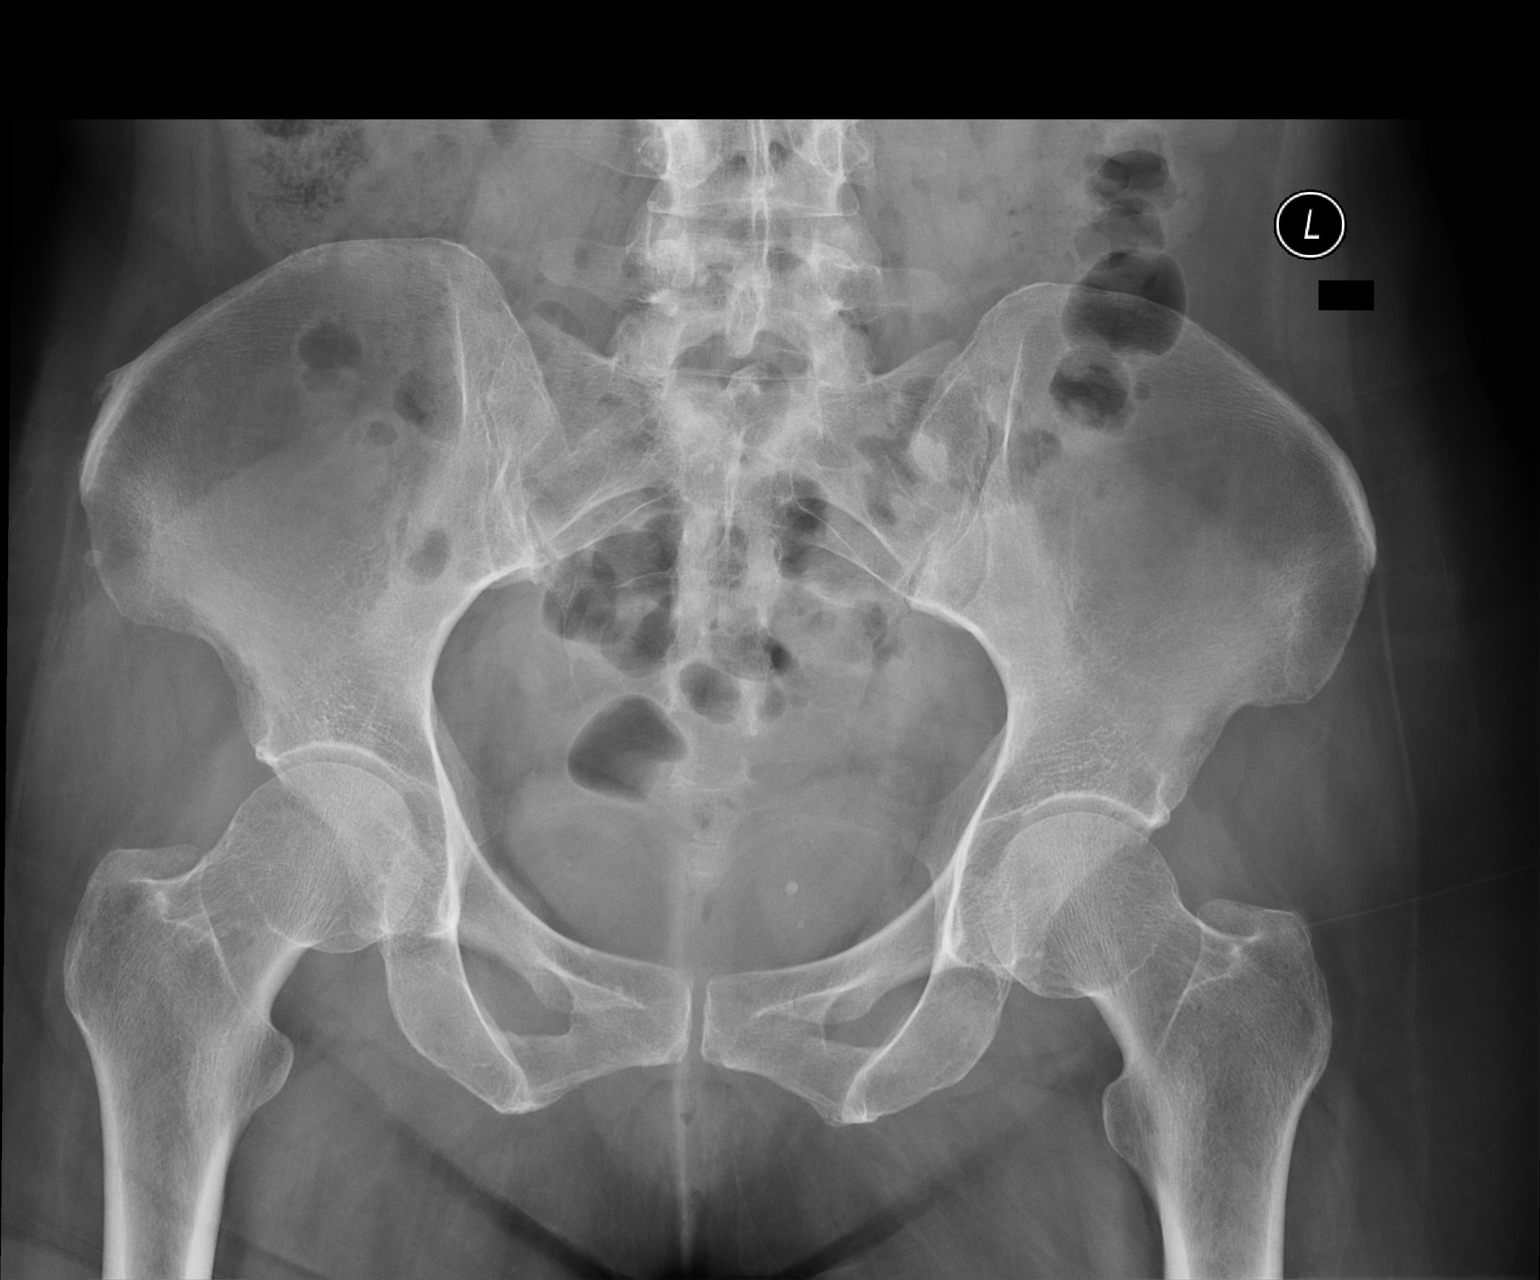
[im 2/3]
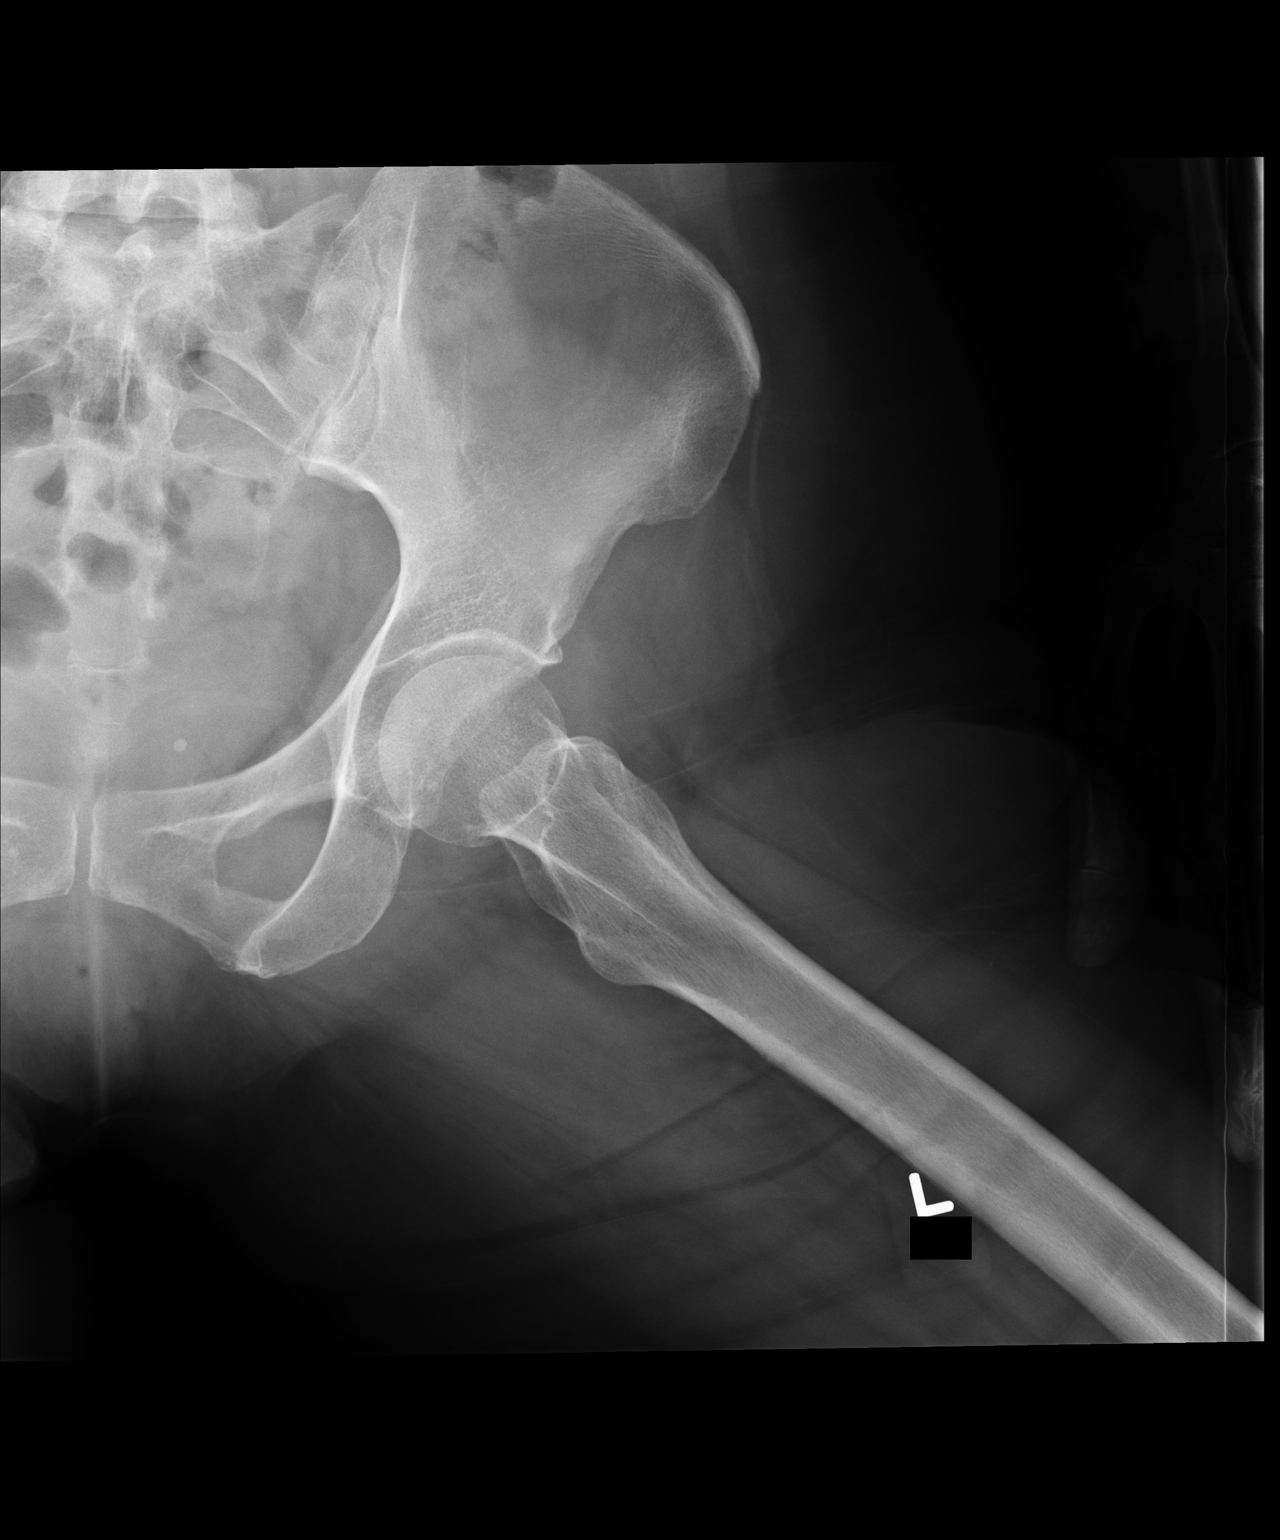
[im 3/3]
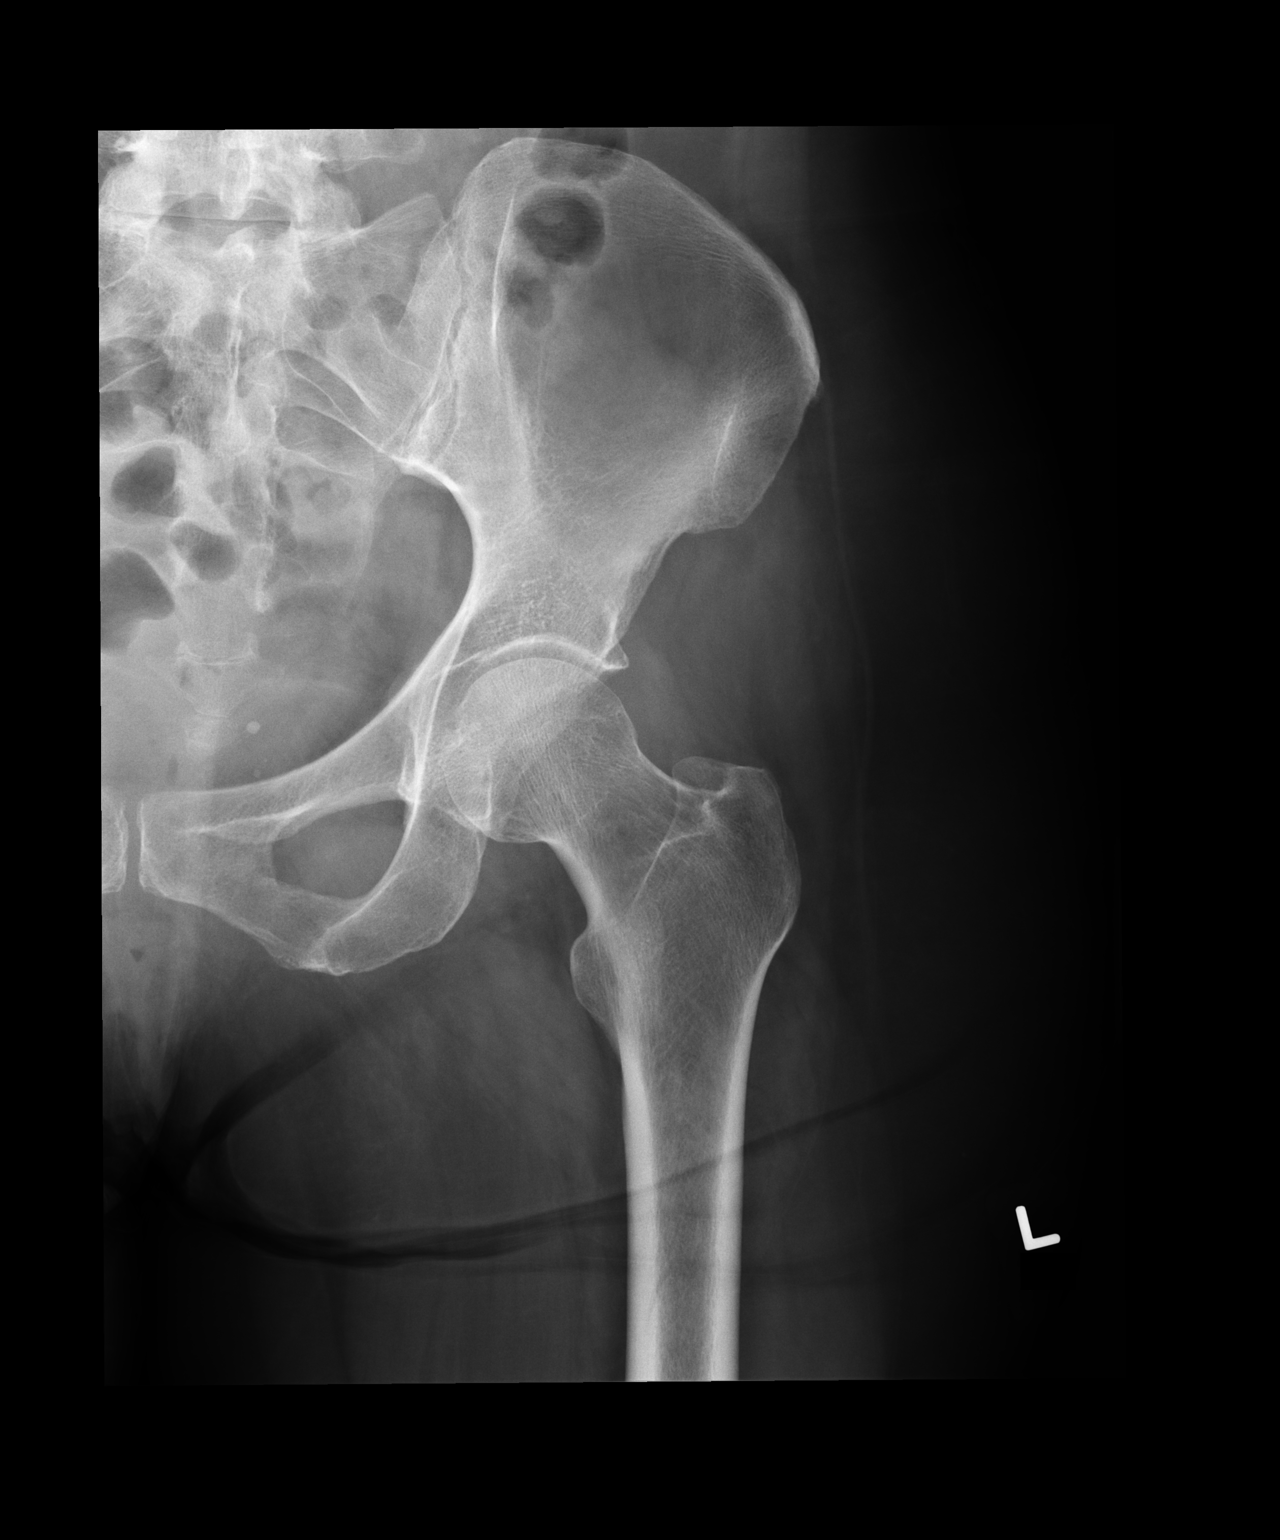

[3 of 3 positions shown; findings below may reference images not displayed]

FINDINGS: No fracture. Normal alignment. Hip joints are preserved. Mild 
degenerative change of the SI joints and spine. Pelvic phleboliths.  
1.  IMPRESSION: 
2.  Hip joints are preserved.  
3.  Mild degenerative change of the SI joints and spine

## 2021-08-03 IMAGING — DX LUMBAR SPINE COMPLETE 4 VIEWS
1 series · 4 of 4 positions shown · non-contrast
Comparison: None

________________________________________________________________________________________________ 
LUMBAR SPINE COMPLETE 4 VIEWS, 08/03/2021 [DATE]: 
CLINICAL INDICATION:  Low back pain. History of breast cancer

[Series 1: AP · U · 0.14mm/px · 4 of 4 slices shown]
[im 1/4]
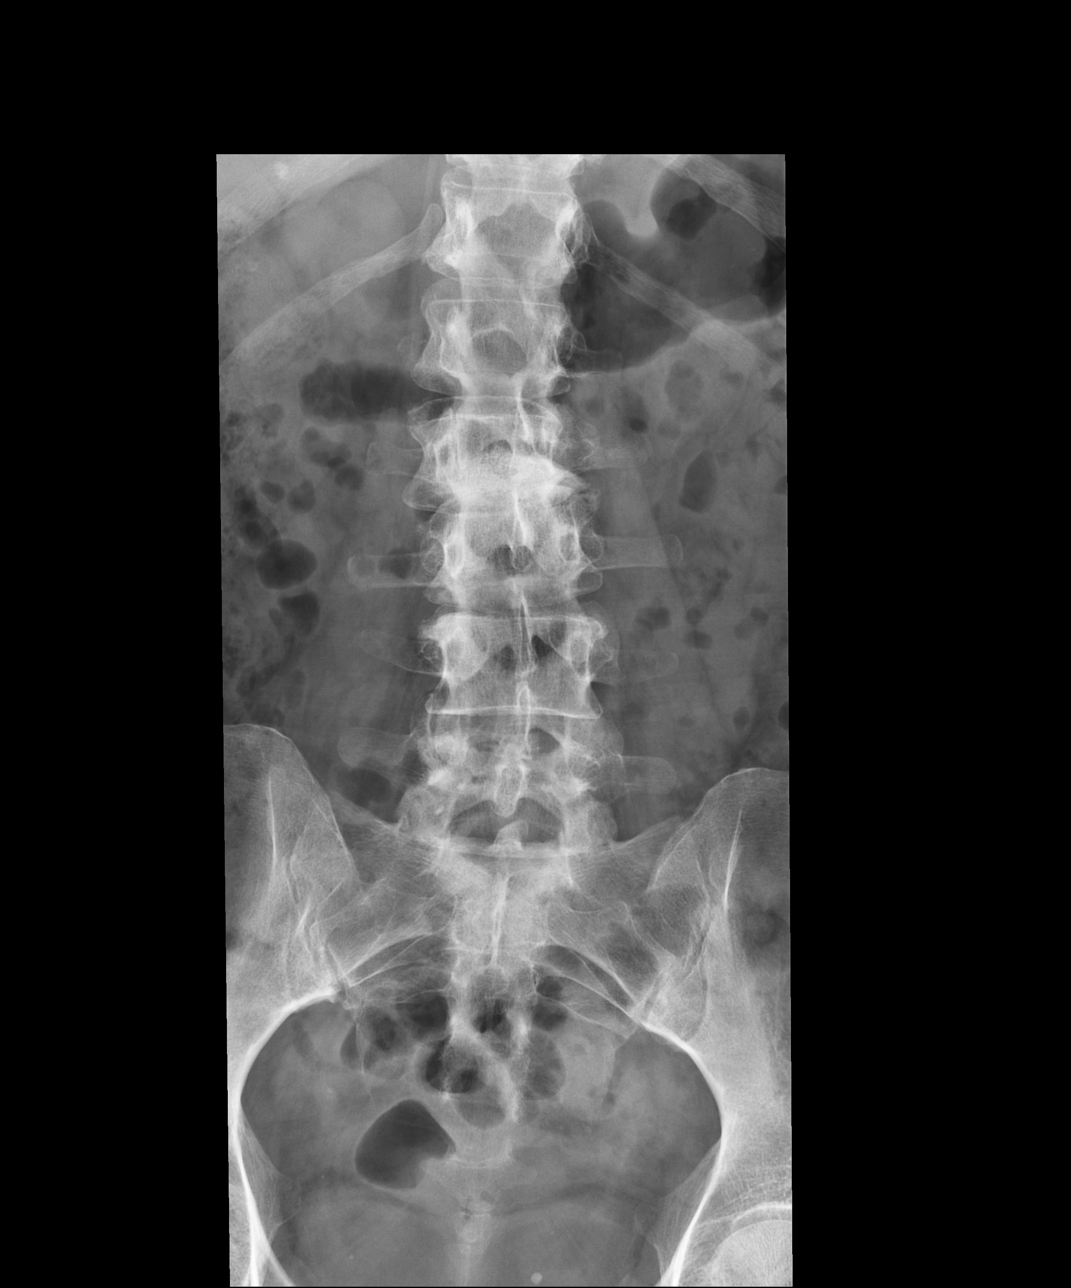
[im 2/4]
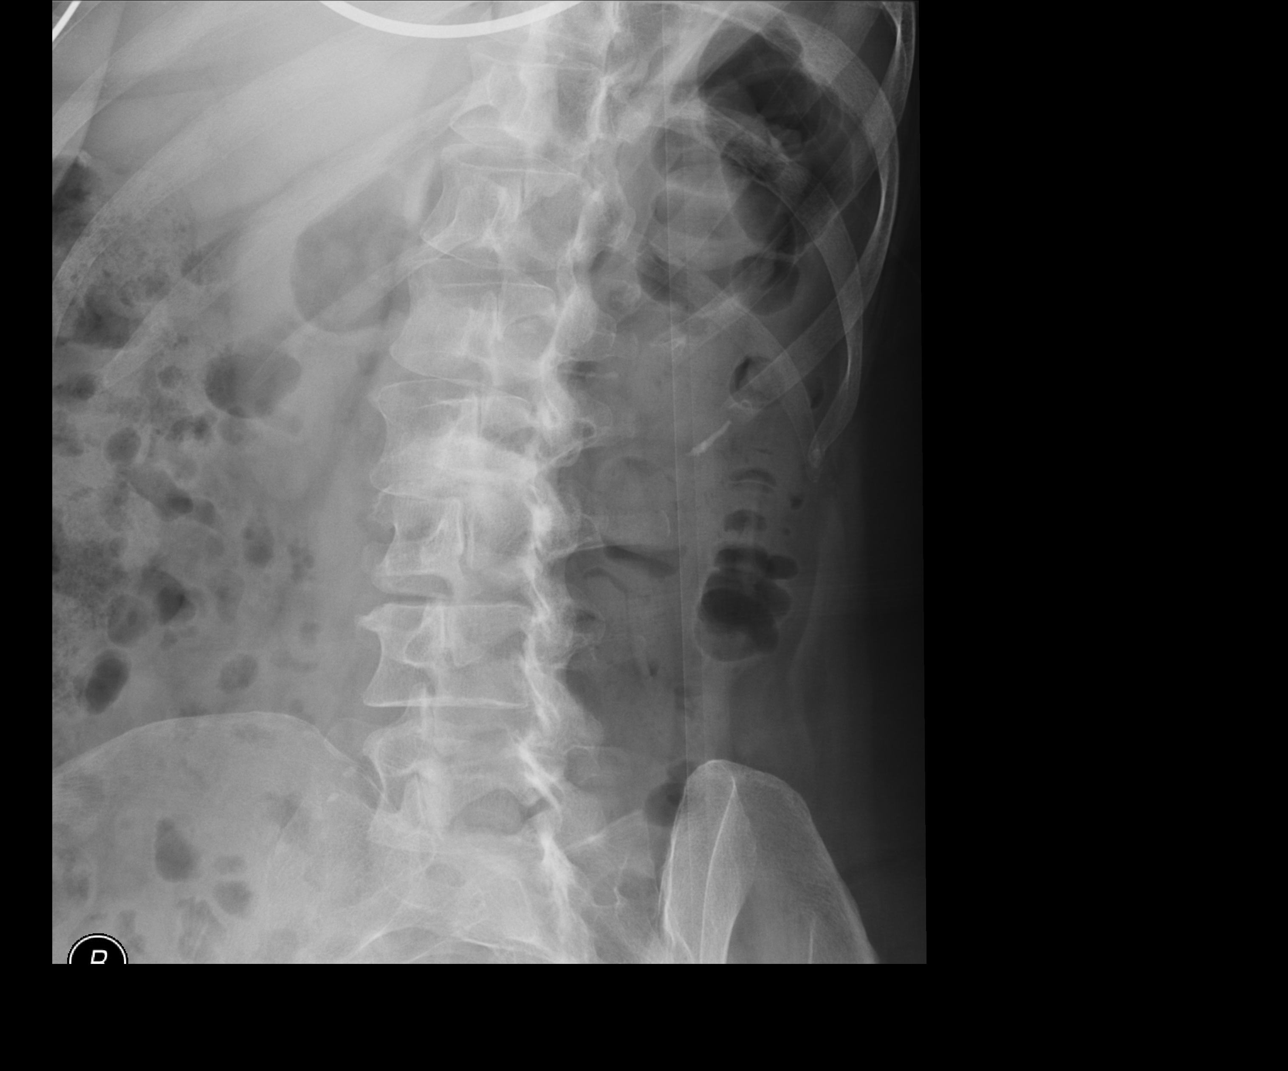
[im 3/4]
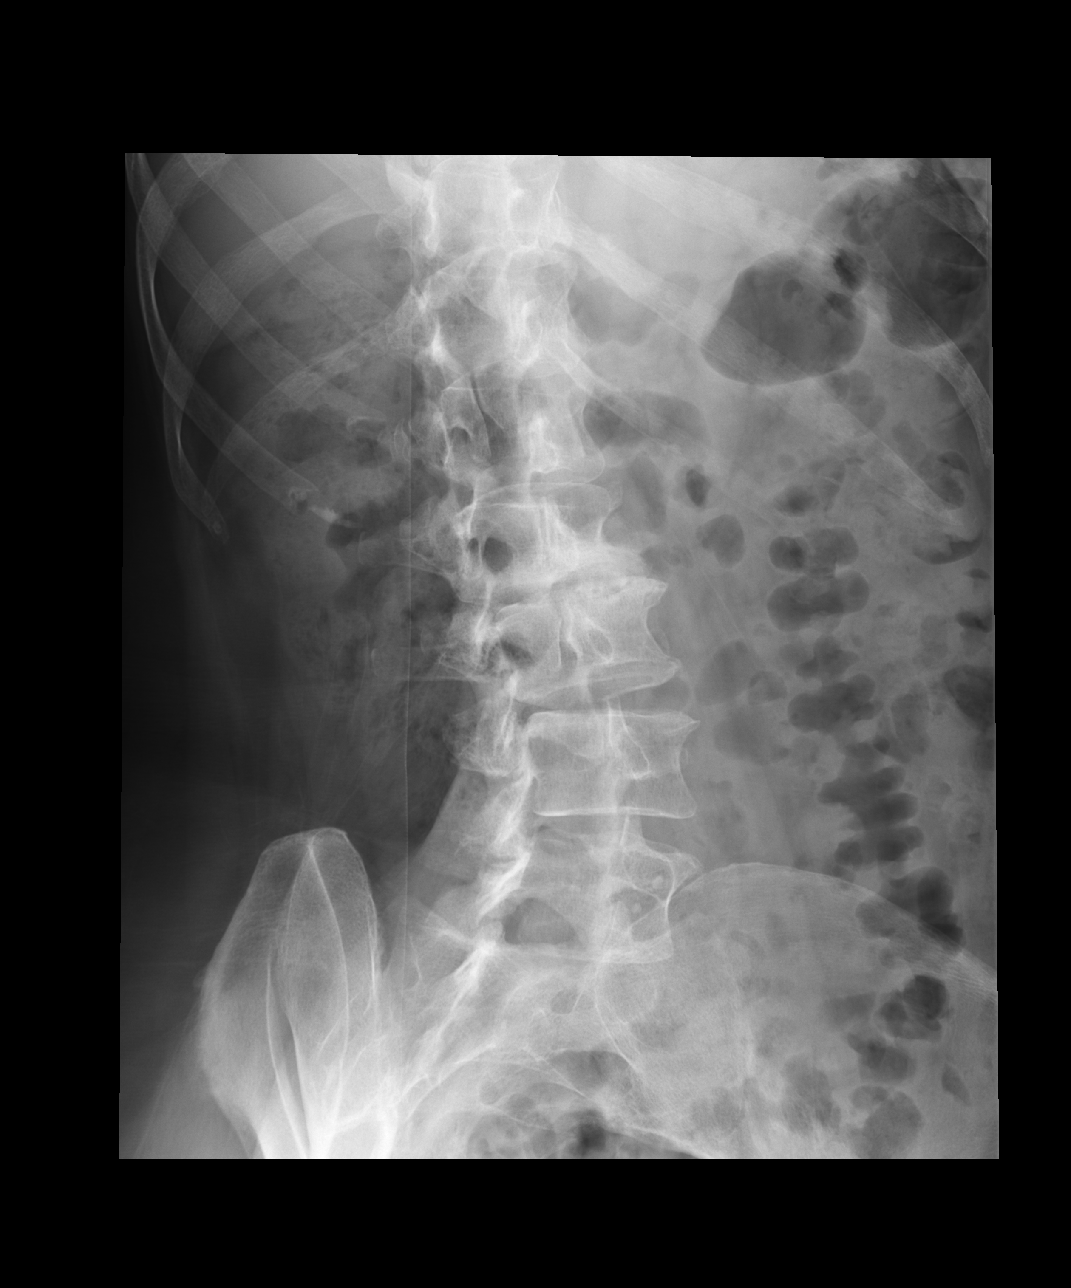
[im 4/4]
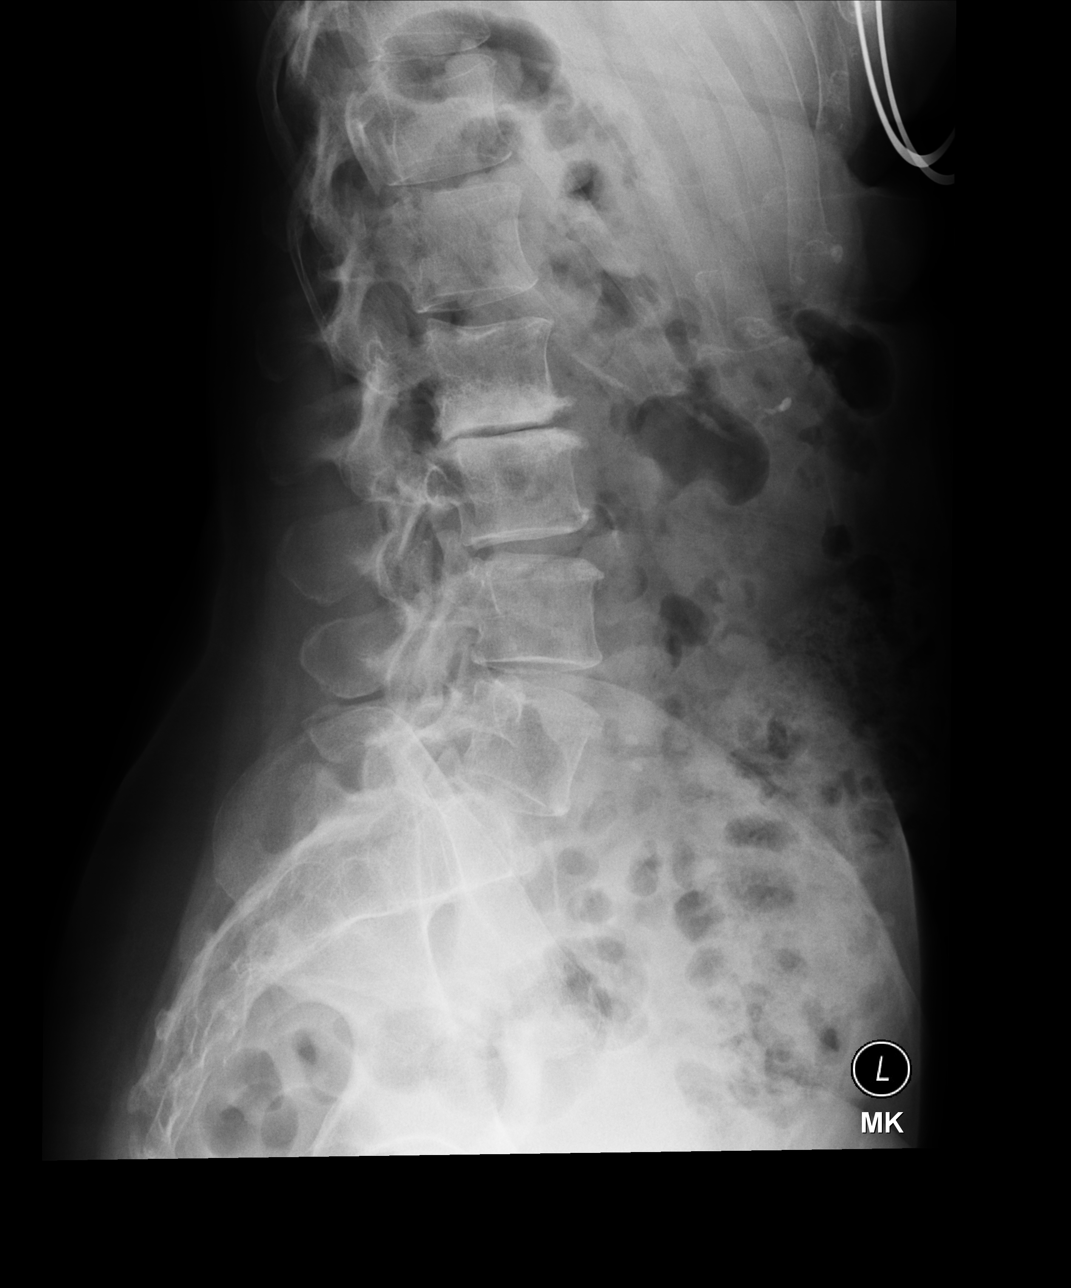

[4 of 4 positions shown; findings below may reference images not displayed]

FINDINGS: Lumbar vertebral heights are intact. Marked disc narrowing at L2-3 
with endplate sclerosis and slight retrolisthesis. Mild to moderate disc 
narrowing at L3-4 and L4-5. There is mild S-shaped lumbar scoliosis. There are 
facet degenerative changes throughout the lumbar spine. No evidence for bone 
destruction. Oblique views show no spondylolysis.
IMPRESSION: Degenerative changes, most pronounced at L2-3. MRI would be useful for further 
evaluation.

## 2021-10-14 IMAGING — CT CT SINUS WITHOUT CONTRAST
3 series · 13 of 47 positions shown, 15 images · non-contrast
Comparison: There are no previous exams available for comparison.

________________________________________________________________________________________________ 
CT SINUS WITHOUT CONTRAST, 10/14/2021 [DATE]: 
CLINICAL INDICATION: Recurrent sinus infections.. 
A search for DICOM formatted images was conducted for prior CT imaging studies 
completed at a non-affiliated media free facility.
TECHNIQUE: The paranasal sinuses were scanned without contrast on a high 
resolution CT scanner using dose reduction techniques.  Routine MPR 
reconstructions were performed.

[Series 3: sinus st 0.75 h30s · axial · 0.36mm/px · z∈[-174,-82]mm · 7 of 161 slices shown, 9 images]
[im 17/161  brain]
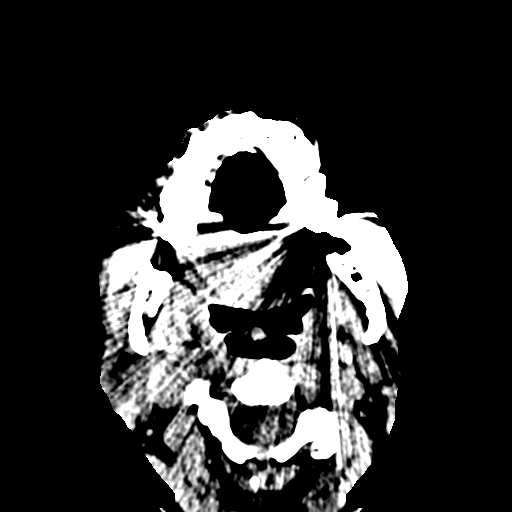
[im 17/161  bone]
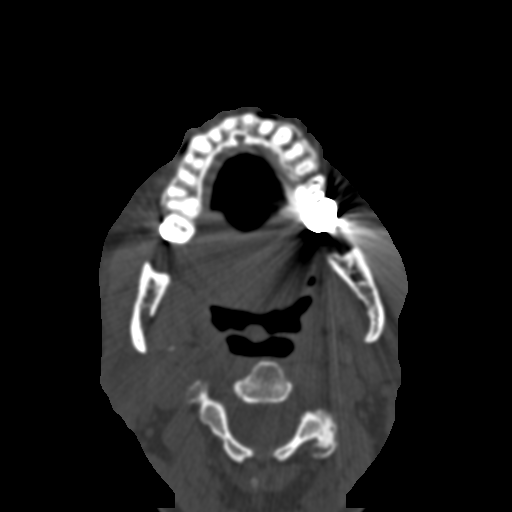
[im 39/161  bone]
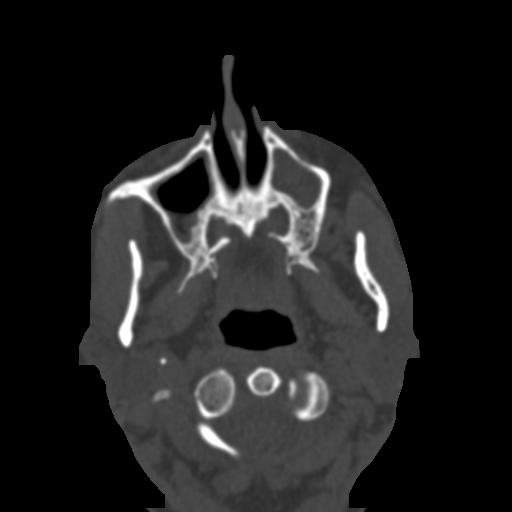
[im 61/161  bone]
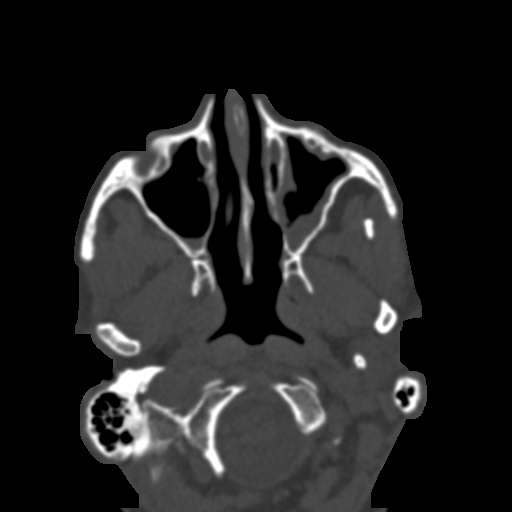
[im 83/161  bone]
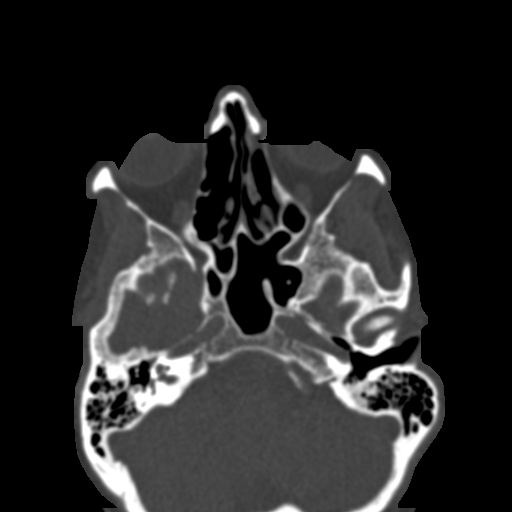
[im 105/161  brain]
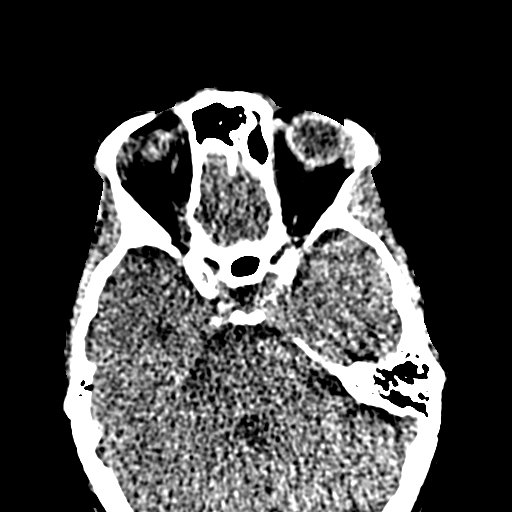
[im 105/161  bone]
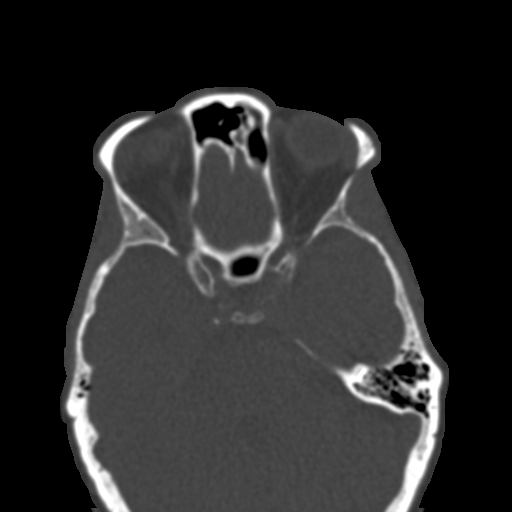
[im 127/161  bone]
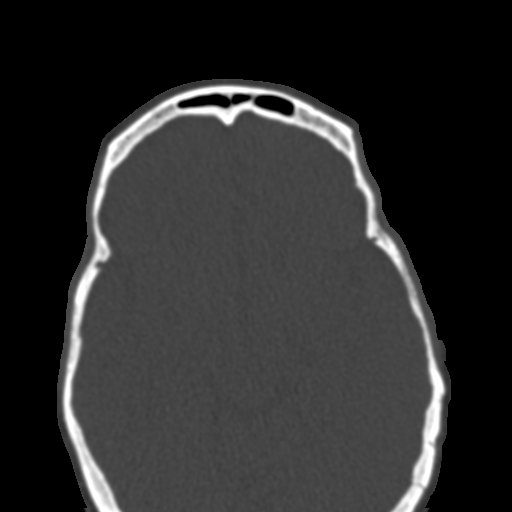
[im 149/161  bone]
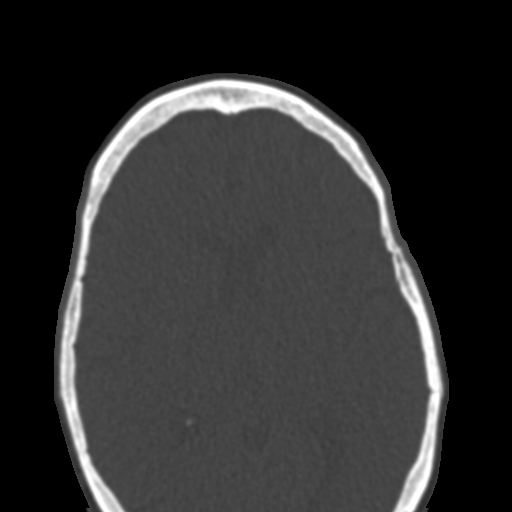

[Series 4: coronal · coronal · 0.23mm/px · 3 of 175 slices shown]
[im 59/175  bone]
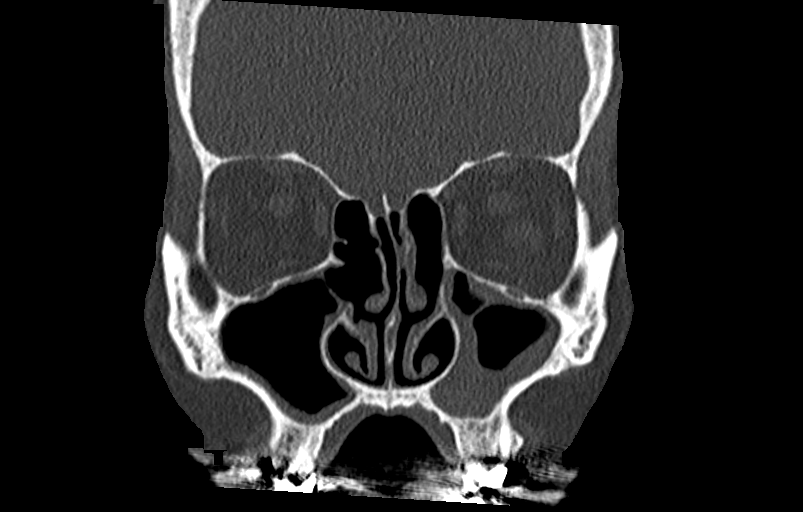
[im 78/175  bone]
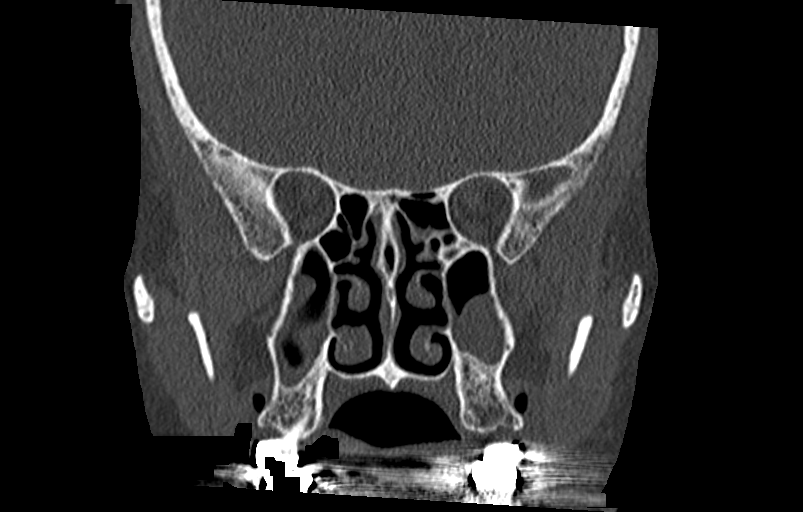
[im 97/175  bone]
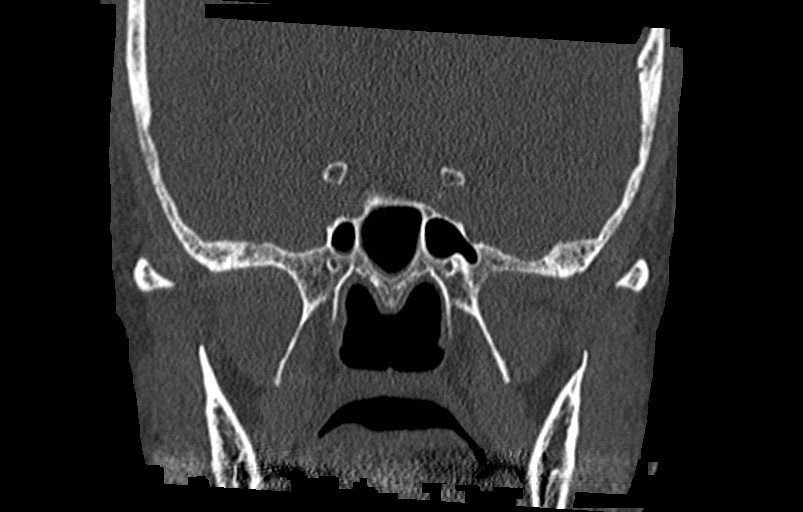

[Series 5: sag · sagittal · 0.23mm/px · 3 of 170 slices shown]
[im 57/170  bone]
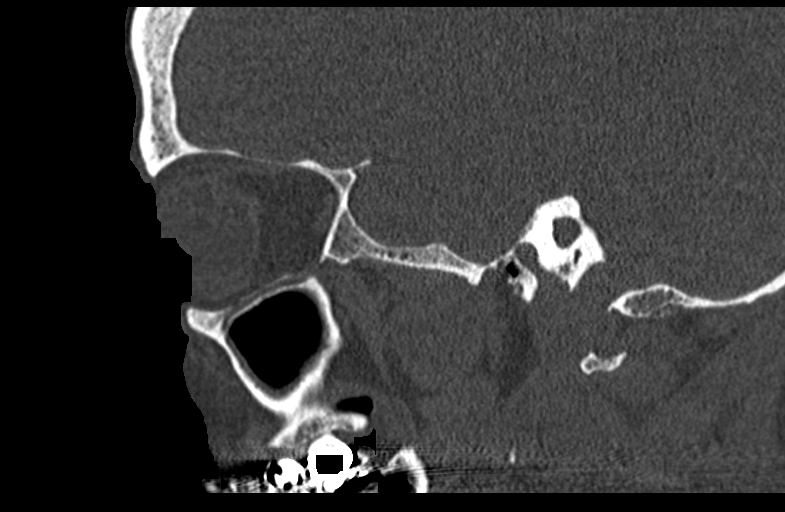
[im 85/170  bone]
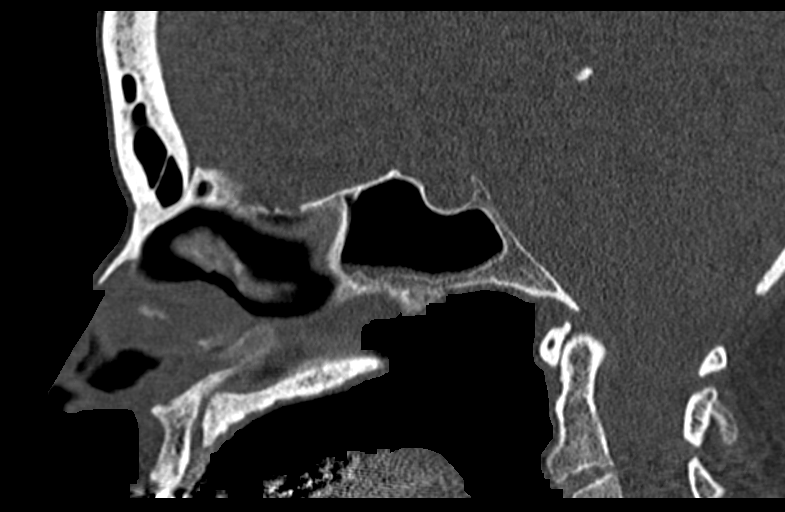
[im 113/170  bone]
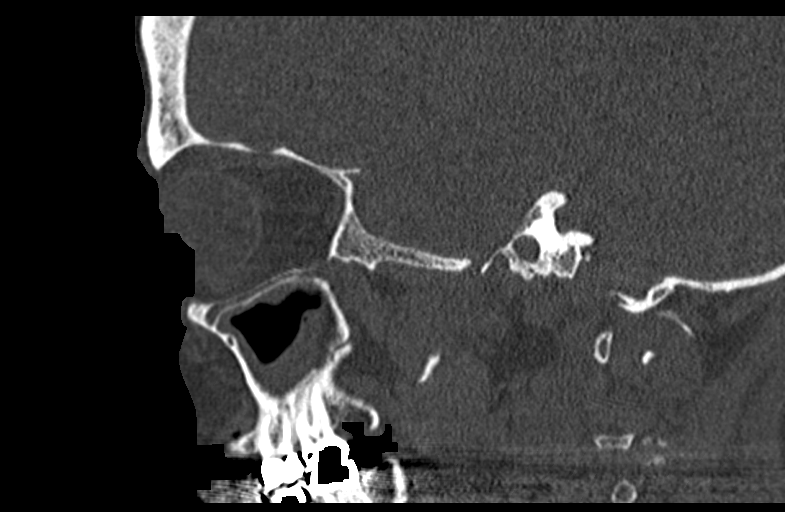

[13 of 47 positions shown; findings below may reference images not displayed]

FINDINGS: -------------------------------------------------------------------------------- 
------------------------- 
SINUSES: 
FRONTAL SINUSES: Mild mucosal thickening in the left frontal sinus.     
ETHMOID AIR CELLS: Bilateral partial ethmoidectomy. Mild mucosal thickening 
bilaterally.     
SPHENOID SINUSES: Frothy material in the right sphenoid sinus. Mild mucosal 
thickening in the bilateral sphenoid sinuses. 
MAXILLARY SINUSES: Bilateral maxillary antrostomy/uncinectomy. Mild fluid in the 
right maxillary sinus with circumferential mild mucosal thickening present. 
Frothy material noted along the superior margin of the right maxillary sinus. 
Moderate mucosal thickening and mucoid material in the left maxillary sinus. 
PARANASAL SINUS DRAINAGE PATHWAYS: Occlusion of left frontal ethmoidal recess 
and bilateral sphenoethmoidal recesses. 
-------------------------------------------------------------------------------- 
----------------------- 
OTHER: 

NASAL CAVITY / SKULL BASE: Nasal septum is deviated towards the left side. The 
skull base is intact. Mastoid air cells and middle ear cavities are clear 
NON-SINUS STRUCTURES: No abnormality of the visualized orbits or intracranial 
compartments. 

-------------------------------------------------------------------------------- 
---------------------
IMPRESSION: Postsurgical changes of prior sinus surgery with mucosal changes as above, worst 
in the left maxillary sinus. 
RADIATION DOSE REDUCTION: All CT scans are performed using radiation dose 
reduction techniques, when applicable.  Technical factors are evaluated and 
adjusted to ensure appropriate moderation of exposure.  Automated dose 
management technology is applied to adjust the radiation doses to minimize 
exposure while achieving diagnostic quality images.

## 2021-11-01 IMAGING — MR MRI LUMBAR SPINE WITHOUT CONTRAST
4 of 6 series · 14 of 48 positions shown · IV contrast (gadolinium)
Comparison: Lumbar radiograph August 03, 2021

________________________________________________________________________________________________ 
MRI LUMBAR SPINE WITHOUT CONTRAST, 11/01/2021 [DATE]: 
CLINICAL INDICATION: Low back pain extending to left hip and groin
TECHNIQUE: Sagittal T1, Sagittal T2, Sagittal STIR, Axial T1 and Axial T2 MR 
images of the lumbar spine were performed without intravenous gadolinium 
enhancement.

[Series 101: survey · axial · 10.0mm · 1.39mm/px · z∈[-15,+199]mm · 5 of 9 slices shown]
[im 1/9]
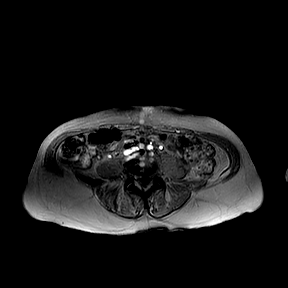
[im 3/9]
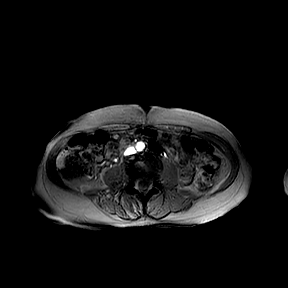
[im 5/9]
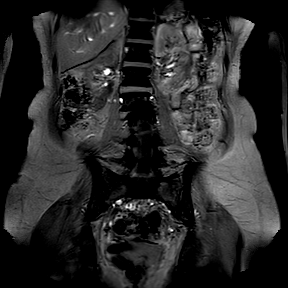
[im 7/9]
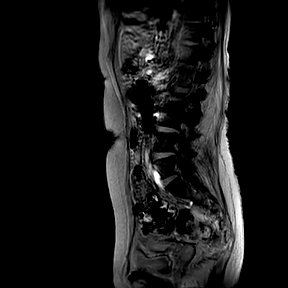
[im 9/9]
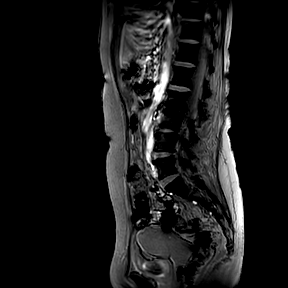

[Series 201: t2w_cor-surv · coronal · 6.0mm · 0.50mm/px · 3 of 6 slices shown]
[im 1/6]
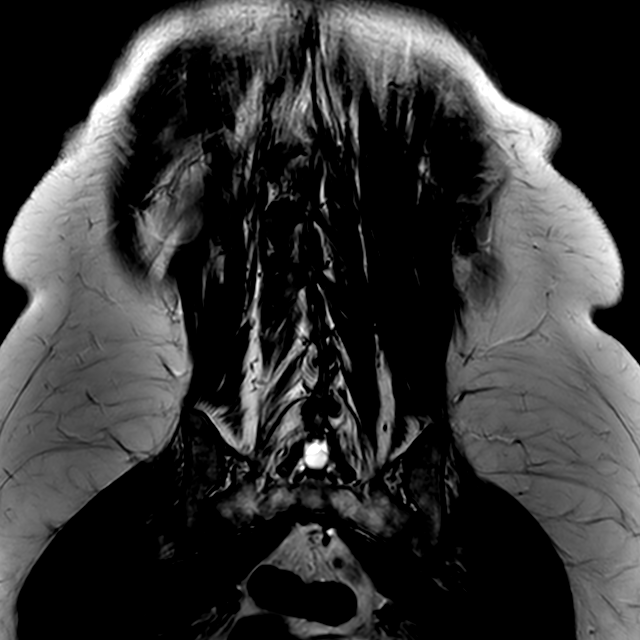
[im 4/6]
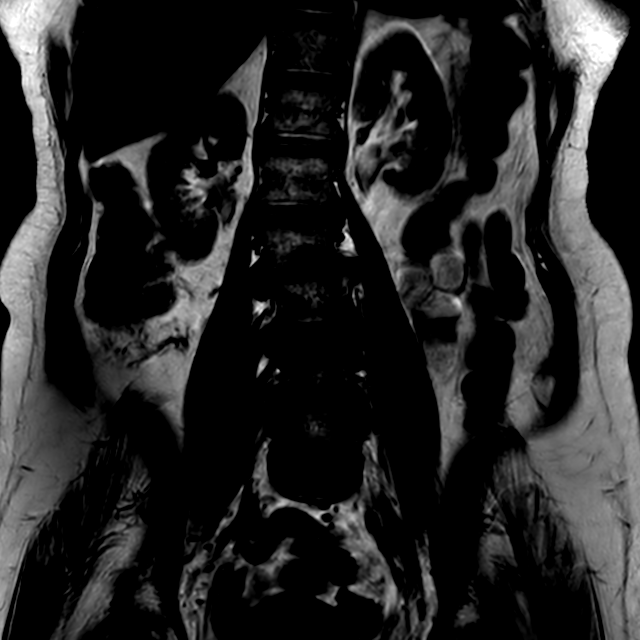
[im 6/6]
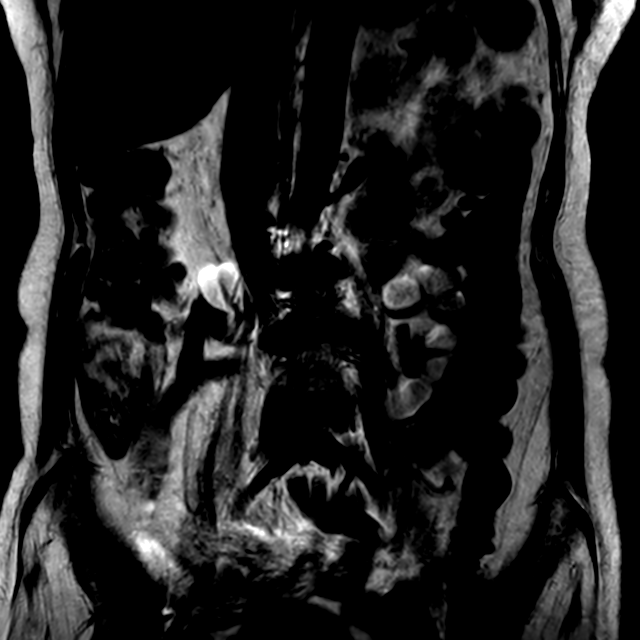

[Series 301: t2w_tse sag · sagittal · 4.0mm · 0.31mm/px · 3 of 17 slices shown]
[im 3/17]
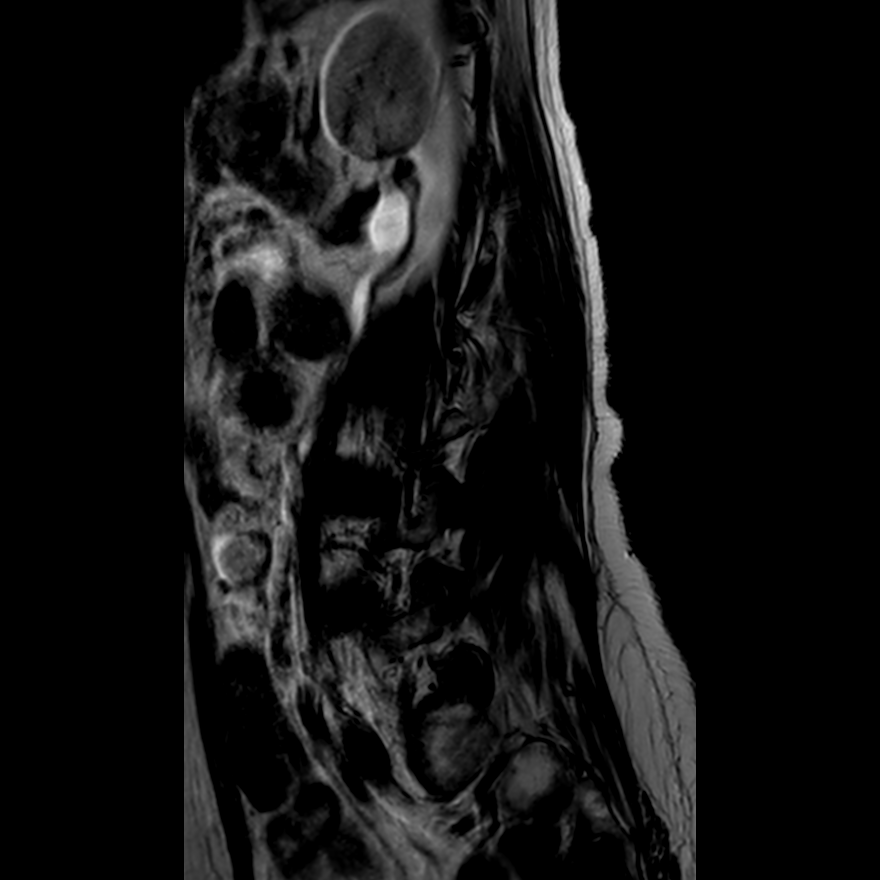
[im 10/17]
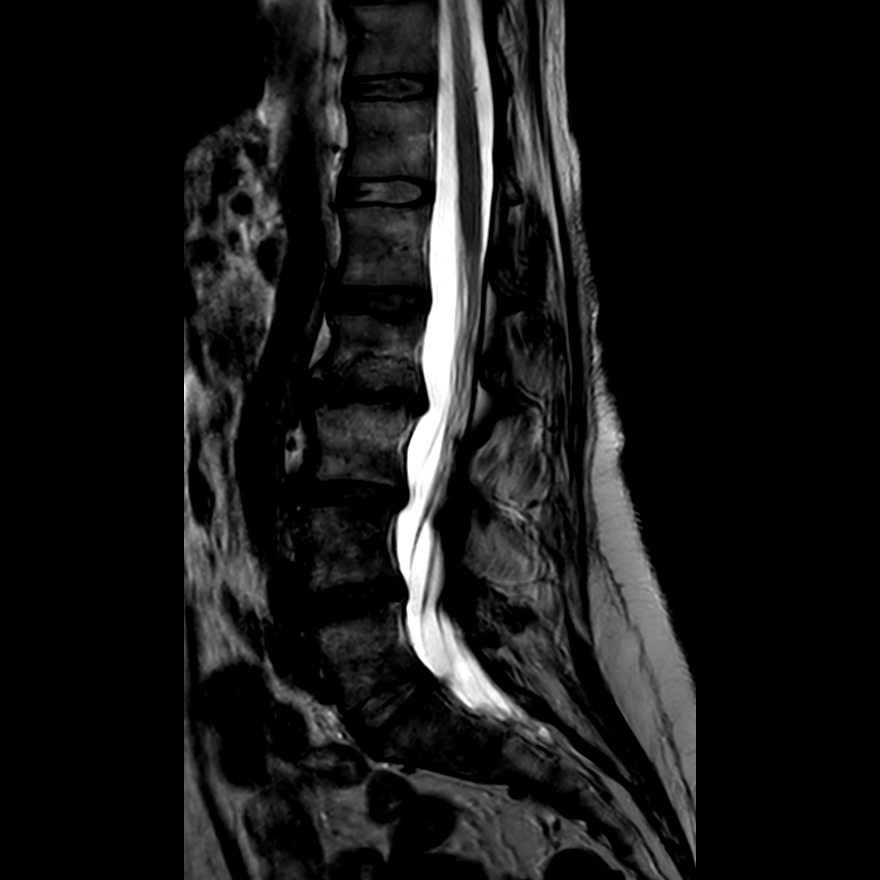
[im 14/17]
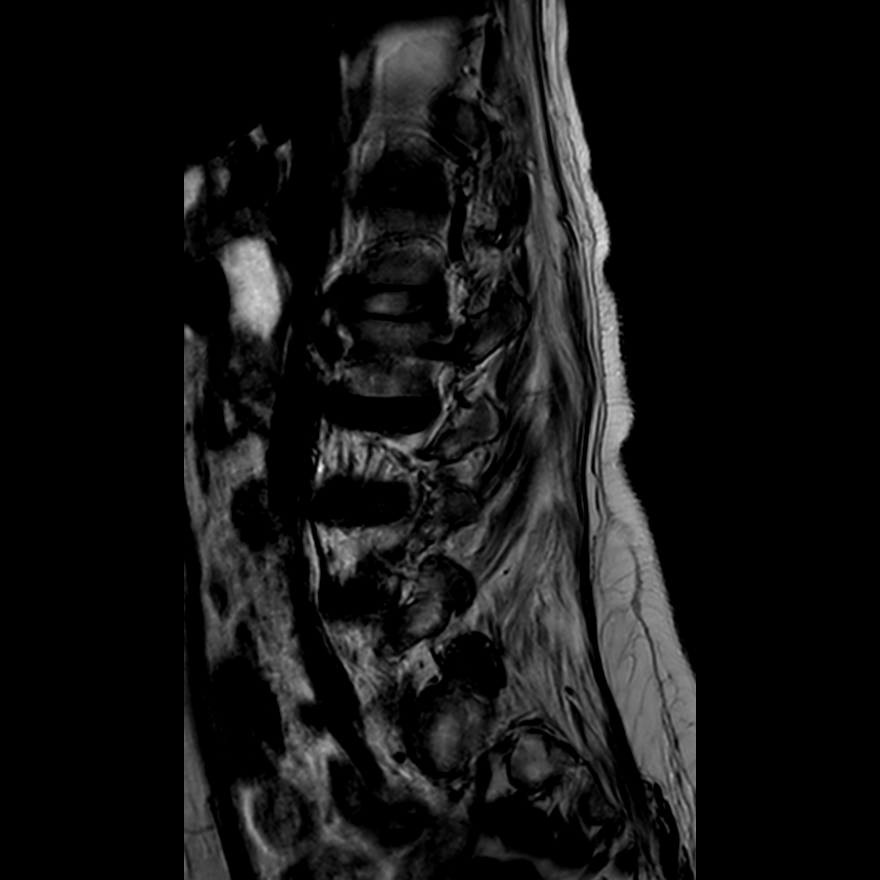

[Series 401: t1w_tse sag · sagittal · 4.0mm · 0.49mm/px · 3 of 17 slices shown]
[im 3/17]
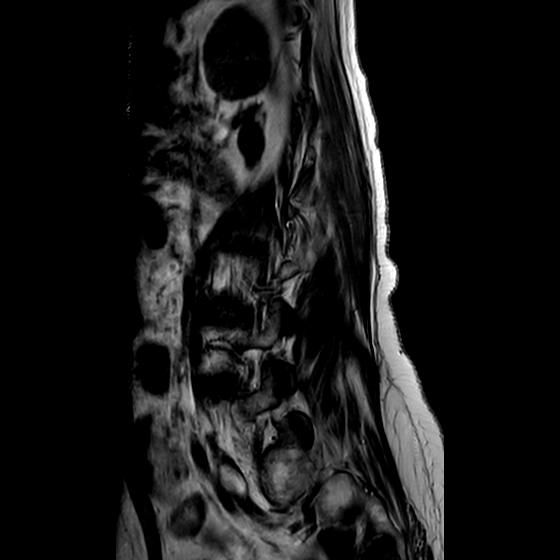
[im 10/17]
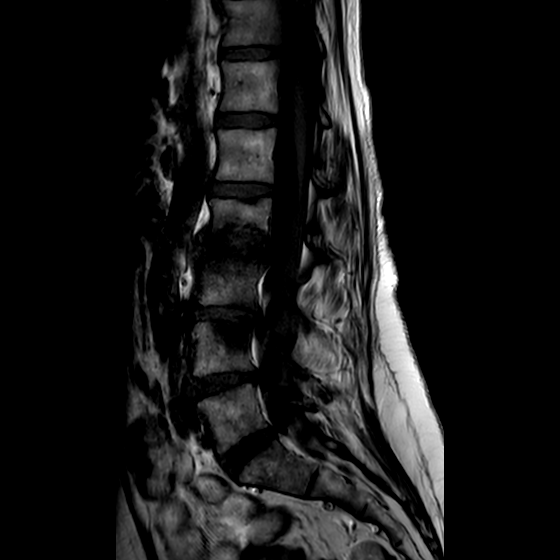
[im 14/17]
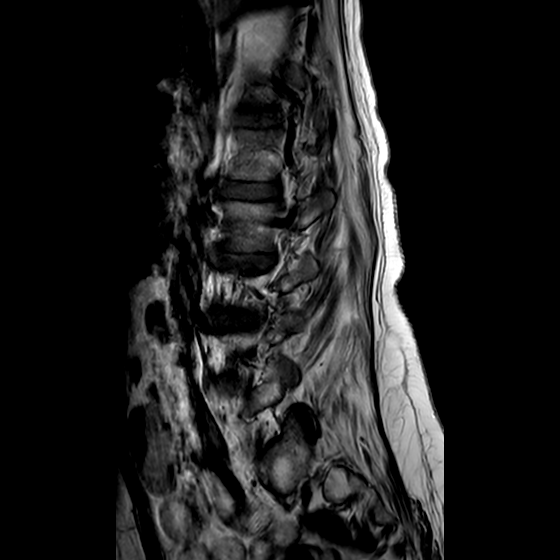

[14 of 48 positions shown; findings below may reference images not displayed]

FINDINGS: Lumbar vertebral heights are intact. There is mild scoliosis, upper 
curve convex to the left. There is marked disc narrowing at L2-3 and along the 
right L3-4 interspace. There is extensive Modic type I change at L2-3, moderate 
involvement at L3-4. The conus terminates opposite T12-L1. 
At L5-S1 the canal is open. Foramina are open. 
At L4-5 there is mild midline disc bulge with annular tear. There is no 
significant canal stenosis. Foramina are open. 
At L3-4 there is mild broad-based disc bulge and slight retrolisthesis. There 
are moderate facet degenerative changes. There is moderate right foraminal 
stenosis, left foramen open. 
At L2-3 there is right posterolateral disc-osteophyte, with mild encroachment on 
the exiting right L3 nerve root, axial T2 image 17. There is mild to moderate 
left foraminal stenosis, right foramen open. 
At L1-2 the canal and foramina are open. 
There are moderate facet degenerative changes throughout the lumbar spine. 
No evidence for spinal malignancy.
IMPRESSION: Degenerative changes, most pronounced at L2-3 where there is right 
posterolateral disc-osteophyte encroaching on the right L3 nerve root. There is 
extensive degenerative reactive endplate edema/Modic type I change at this 
level. 
Moderate Modic type I change on the right at L3-4. 
Mild upper lumbar levoscoliosis.

## 2021-11-01 IMAGING — MR MRI LEFT ANKLE WITHOUT CONTRAST
4 of 6 series · 22 of 40 positions shown · IV contrast (gadolinium)
Comparison: None.

________________________________________________________________________________________________ 
MRI LEFT ANKLE WITHOUT CONTRAST, 11/01/2021 [DATE]: 
CLINICAL INDICATION: Pain in the left ankle and foot. Chronic medial ankle pain 
and swelling. Problems ambulating. History of breast cancer.
TECHNIQUE: Multiplanar, multiecho position MR images of the ankle were performed 
without intravenous gadolinium enhancement. Patient was scanned on a 3T magnet.

[Series 301: PD fat-sat · sagittal · left · 2.5mm · 0.37mm/px · 6 of 28 slices shown (1 of 3)]
[im 1/28]
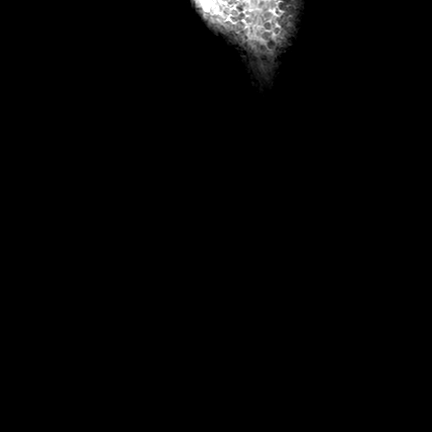
[im 6/28]
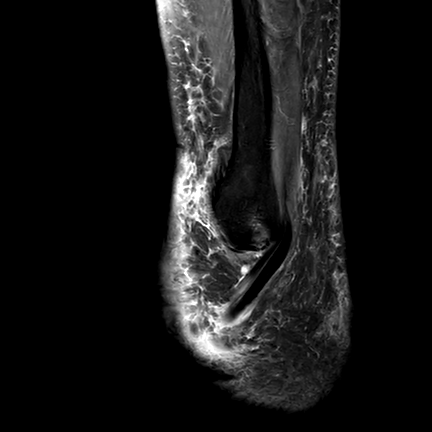
[im 11/28]
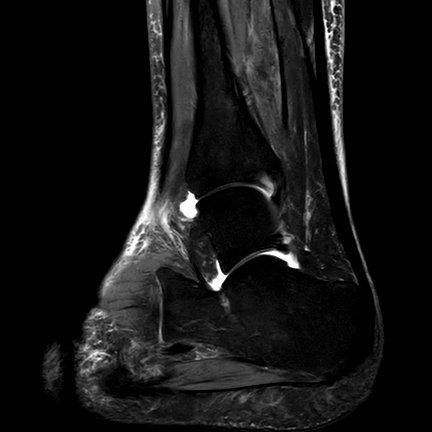
[im 17/28]
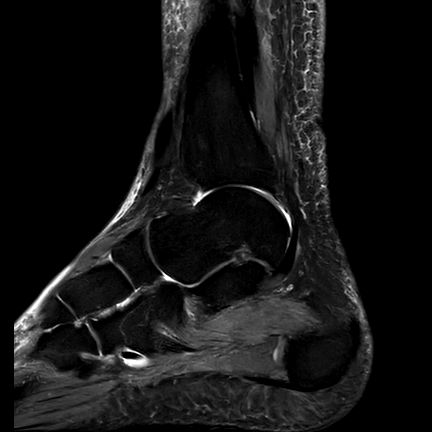
[im 22/28]
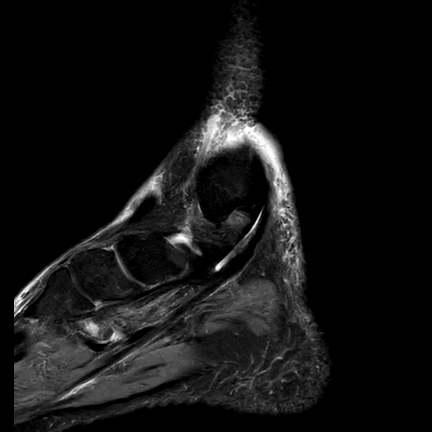
[im 28/28]
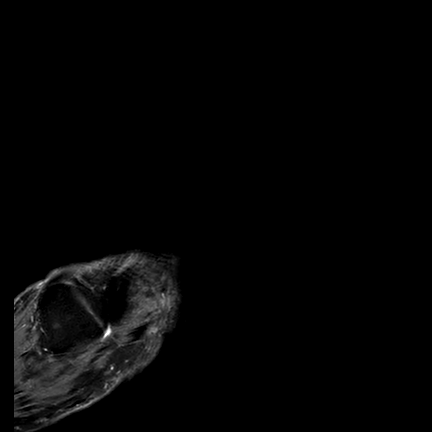

[Series 401: T1 · sagittal · left · 2.5mm · 0.31mm/px · 3 of 28 slices shown]
[im 6/28]
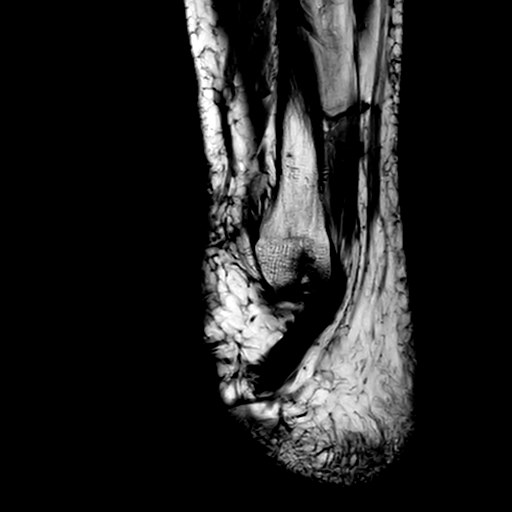
[im 17/28]
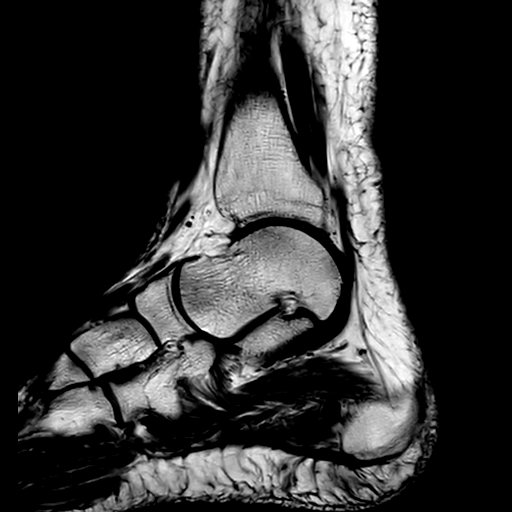
[im 28/28]
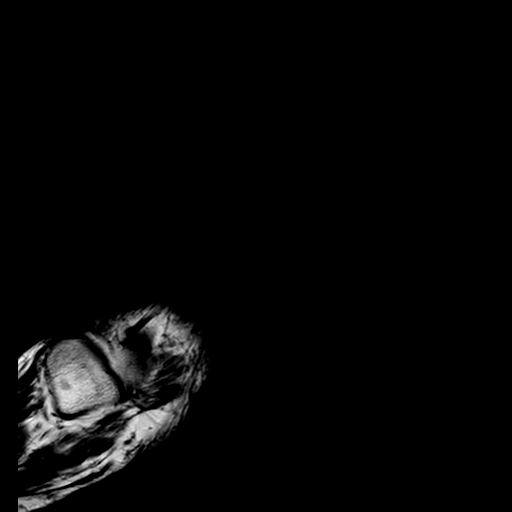

[Series 501: PD fat-sat · axial · left · 3.0mm · 0.31mm/px · z∈[-79,+64]mm · 9 of 43 slices shown (2 of 3)]
[im 1/43]
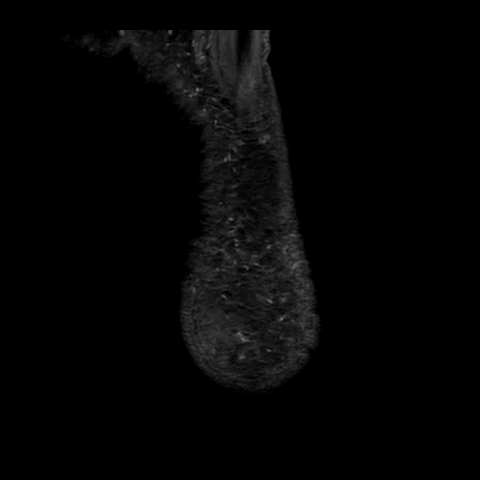
[im 6/43]
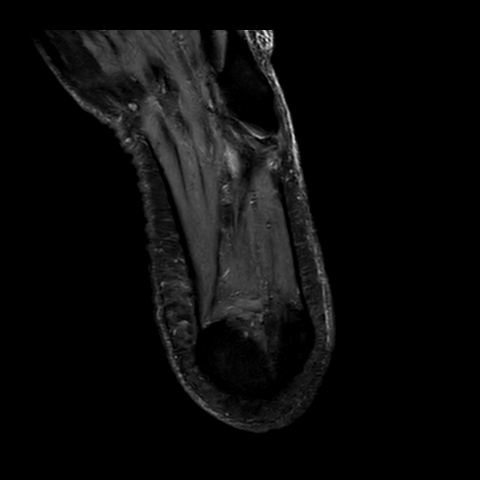
[im 11/43]
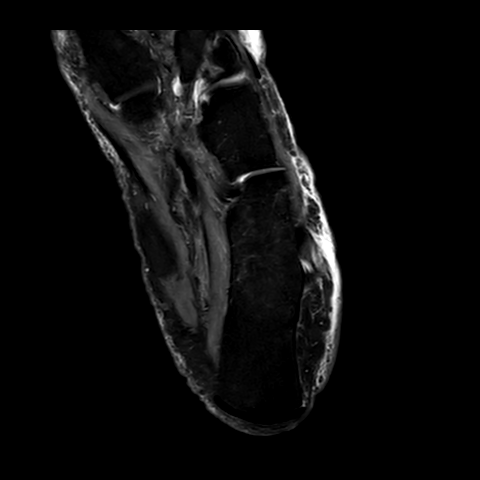
[im 16/43]
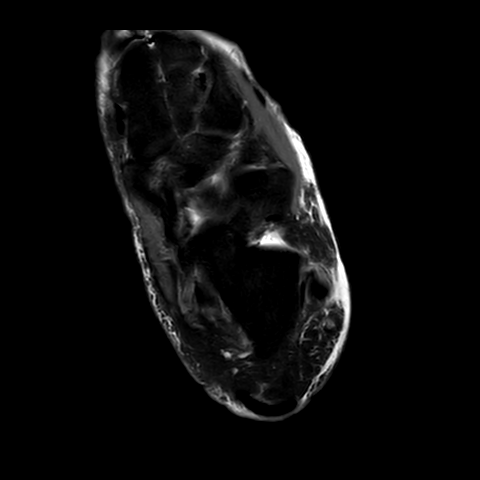
[im 22/43]
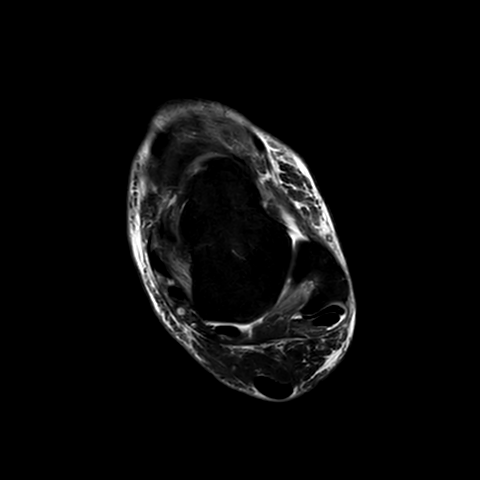
[im 27/43]
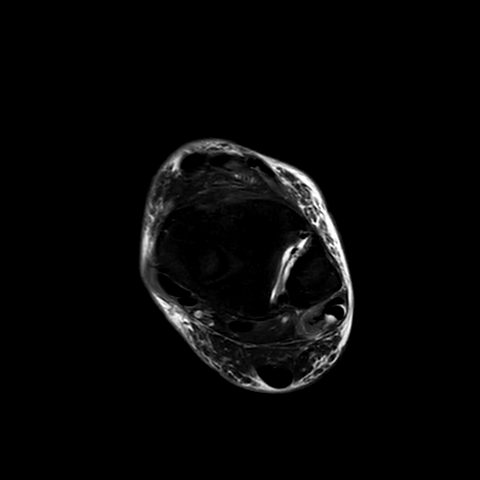
[im 32/43]
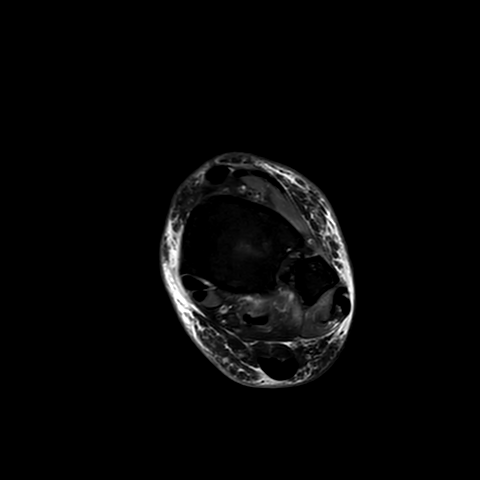
[im 37/43]
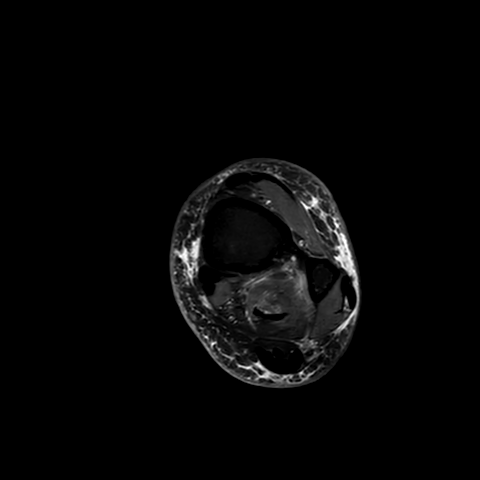
[im 43/43]
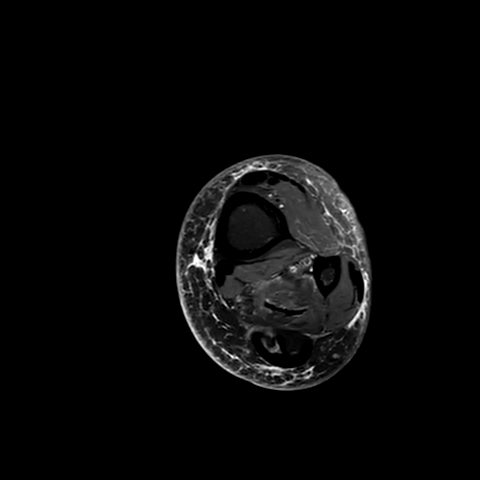

[Series 601: PD fat-sat · coronal · left · 3.0mm · 0.37mm/px · 4 of 36 slices shown (3 of 3)]
[im 1/36]
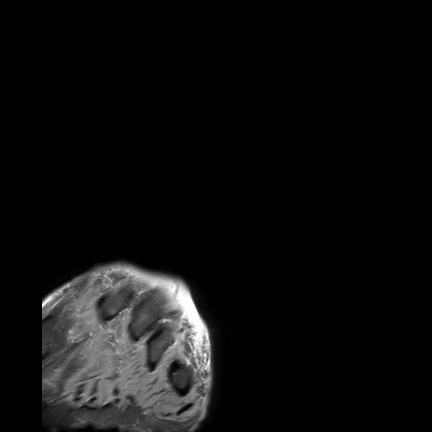
[im 6/36]
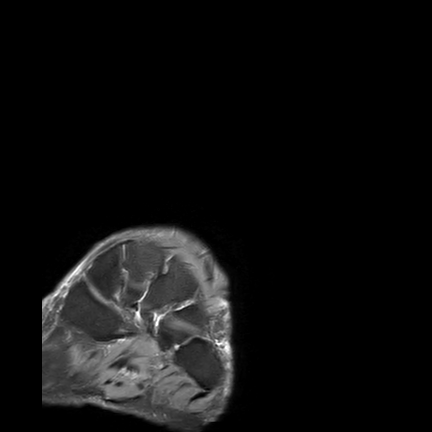
[im 18/36]
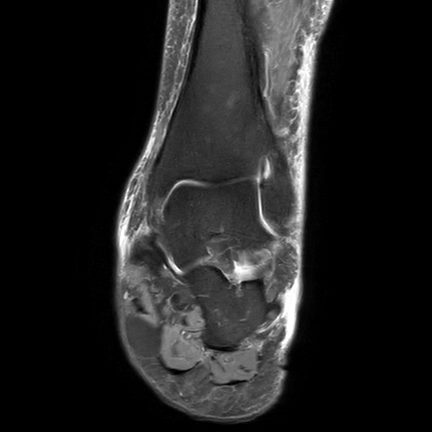
[im 30/36]
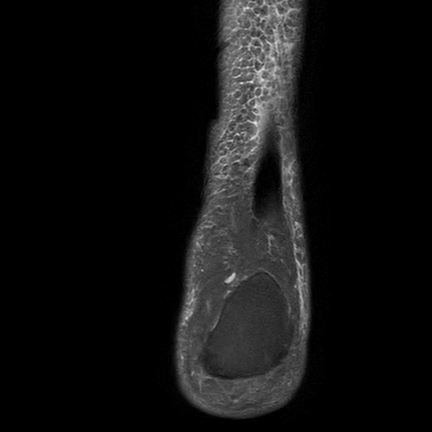

[22 of 40 positions shown; findings below may reference images not displayed]

FINDINGS: TENDONS: There is longitudinal split thickness tearing of the peroneal brevis 
distal to the retromalleolar groove. There is a small amount of fluid within the 
peroneal tendon sheath with mild tenosynovitis. The flexor and extensor tendons 
are intact. The Achilles tendon is preserved. 
LIGAMENTS:  
LATERAL LIGAMENTS: The anterior talofibular ligament is intact. The 
calcaneofibular ligament and posterior talofibular ligament are preserved. 
SYNDESMOTIC LIGAMENTS: The anterior and posterior tibiofibular and interosseous 
ligaments are preserved. 
DELTOID LIGAMENTOUS COMPLEX: The deep and superficial components of the deltoid 
ligamentous complex are intact. 
SINUS TARSI LIGAMENTS: The cervical and interosseous ligaments are preserved. 
The inferior extensor retinaculum appears intact.  
JERROLD LIGAMENTOUS COMPLEX: Plantar calcaneonavicular ligament preserved. 
BONES AND JOINTS: Normal bone marrow signal intensity. No fracture, contusion or 
stress response. The talar dome is preserved. No focal osteochondral lesion. 
Articular cartilage is preserved. 
ADDITIONAL FINDINGS: There is asymmetric atrophy/fatty infiltration of the 
included flexor hallucis longus muscle. There is fatty infiltration of the 
included distal aspect of the medial gastrocnemius at its myotendinous juncture. 
Plantar fascial is preserved. Tarsal tunnel is negative. Sinus tarsi fat is 
preserved. There is scattered edema within the subcutaneous tissues. No soft 
tissue mass or ganglion.
IMPRESSION: 1.  Longitudinal split thickness tearing of the peroneal brevis and mild 
peroneal tenosynovitis.. 
2.  Nonspecific atrophy/fatty infiltration of the flexor hallucis and included 
portion of the distal medial gastrocnemius. 
3.  Scattered subcutaneous edema.

## 2022-02-05 ENCOUNTER — Telehealth: Payer: Self-pay

## 2022-02-05 ENCOUNTER — Encounter: Payer: Self-pay | Admitting: Surgery

## 2022-02-05 DIAGNOSIS — B379 Candidiasis, unspecified: Secondary | ICD-10-CM

## 2022-02-05 MED ORDER — FLUCONAZOLE 150 MG PO TABS *I*
150.00 mg | ORAL_TABLET | ORAL | 0 refills | Status: AC
Start: 2022-02-05 — End: ?

## 2022-02-05 NOTE — Telephone Encounter (Signed)
Mychart message from patient. That she has yeast infection symptoms.   Outgoing t/c to patient to discuss symptoms .     Patient states she was on antibiotic for sinus infection after covid and now has white curdled looking lumpy discharge. She has had yeast infection in past. Patient states she has taken diflucan in past and that has helped.    Patient spoke to front office staff and 9/27 was the soonest appointment with Chauncey Fischer, PA.     Patient requesting diflucan order to [facility].     She is currently in the [place] area.     Patient has an appointment with Chauncey Fischer, PA on 9/27.    Routed to [facility] providers.    Bernerd Pho, RN

## 2022-02-05 NOTE — Telephone Encounter (Signed)
Outgoing t/c to patient updating that script sent and to call nurses back if still having symptoms and to get in for an appointment.     Bernerd Pho, RN

## 2022-02-05 NOTE — Telephone Encounter (Signed)
Rx diflucan signed. Patient needs apt if no improvement in symptoms.  Thayer Ohm, NP

## 2022-02-08 ENCOUNTER — Other Ambulatory Visit: Payer: Self-pay | Admitting: Gastroenterology

## 2022-02-10 NOTE — Progress Notes (Signed)
Received mammogram results from [facility] - 02/08/22 DOS. Given to nursing.

## 2022-02-24 ENCOUNTER — Encounter: Payer: Self-pay | Admitting: Surgery

## 2022-02-24 ENCOUNTER — Other Ambulatory Visit: Payer: Self-pay

## 2022-02-24 ENCOUNTER — Other Ambulatory Visit
Admission: RE | Admit: 2022-02-24 | Discharge: 2022-02-24 | Disposition: A | Discharge status: Home / Self Care | Payer: Medicare Other | Source: Ambulatory Visit | Attending: Surgery | Admitting: Surgery

## 2022-02-24 ENCOUNTER — Ambulatory Visit: Payer: Medicare Other | Attending: Surgery | Admitting: Surgery

## 2022-02-24 VITALS — BP 116/80 | Temp 98.3°F | Wt 130.0 lb

## 2022-02-24 DIAGNOSIS — K5900 Constipation, unspecified: Secondary | ICD-10-CM | POA: Insufficient documentation

## 2022-02-24 DIAGNOSIS — E039 Hypothyroidism, unspecified: Secondary | ICD-10-CM | POA: Insufficient documentation

## 2022-02-24 DIAGNOSIS — Z01419 Encounter for gynecological examination (general) (routine) without abnormal findings: Secondary | ICD-10-CM | POA: Insufficient documentation

## 2022-02-24 DIAGNOSIS — N3941 Urge incontinence: Secondary | ICD-10-CM | POA: Insufficient documentation

## 2022-02-24 DIAGNOSIS — Z780 Asymptomatic menopausal state: Secondary | ICD-10-CM | POA: Insufficient documentation

## 2022-02-24 DIAGNOSIS — M8580 Other specified disorders of bone density and structure, unspecified site: Secondary | ICD-10-CM | POA: Insufficient documentation

## 2022-02-24 DIAGNOSIS — N952 Postmenopausal atrophic vaginitis: Secondary | ICD-10-CM | POA: Insufficient documentation

## 2022-02-24 DIAGNOSIS — Z1589 Genetic susceptibility to other disease: Secondary | ICD-10-CM | POA: Insufficient documentation

## 2022-02-24 DIAGNOSIS — B009 Herpesviral infection, unspecified: Secondary | ICD-10-CM | POA: Insufficient documentation

## 2022-02-24 DIAGNOSIS — N951 Menopausal and female climacteric states: Secondary | ICD-10-CM | POA: Insufficient documentation

## 2022-02-24 DIAGNOSIS — Z853 Personal history of malignant neoplasm of breast: Secondary | ICD-10-CM | POA: Insufficient documentation

## 2022-02-24 LAB — T3, FREE: T3,Free: 3.2 pg/mL (ref 2.0–4.4)

## 2022-02-24 LAB — TSH: TSH: 2.23 u[IU]/mL (ref 0.27–4.20)

## 2022-02-24 LAB — T4, FREE: Free T4: 0.9 ng/dL (ref 0.9–1.7)

## 2022-02-24 MED ORDER — ESTRIOL POWD
1 refills | Status: DC
Start: 2022-02-24 — End: 2024-04-02

## 2022-02-24 NOTE — Progress Notes (Signed)
GYN ANNUAL EXAM:     [name] is a 66 y.o. G49P1101 postmenopausal female who presents for an annual GYN exam. The patient complains of migraine headaches.  This is new for her.   She is constipated and has dryer skin.   The patient was last seen in 01/2019 for an annual GYN visit.  She had breast cancer surgery on 03/11/2021.  She had subsequent radiation after the surgery.  This finished in 05/2021.  She is now on Tamoxifen.     She typically lives in [place], [place].  She is here visiting family.  She used to live in [place].  She had been on BHRT prior to having her breast cancer diagnosis.  She sees a functional NP in [place] yearly.   She is on vaginal Estriol.  She has this filled in [place].  It was being filled Editor, commissioning in [place]. [name] [phone]).    She had recent labs done at [place] [facility] in [place] but was unable to pull them up on her phone while she was here.   She sees a Functional Medicine NP in [place] once per year.  Her name is [name].   She tried Ashwagandha for hot flashes 500 mg po BID with some relief, but they are back somewhat now.     GYN History  LMP: Had an endometrial ablation.  LMP was probably around age 63-05/2007.   Last pap smear: Date: 02/28/2019   results: NILM with negative high risk HPV co-testing  History of abnormal pap smear: No.   The patient is sexually active.   Sexually active: single partner, contraception - post menopausal status  Together with partner: 51 years-married  STD History: HSV 2  Current contraception: post menopausal status  HPV vaccine series: Not received    OB History   Gravida Para Term Preterm AB Living   2 2 1 1  0 1   SAB IAB Ectopic Multiple Live Births   0 0 0 0        # Outcome Date GA Lbr Len/2nd Weight Sex Delivery Anes PTL Lv   2 Preterm  [redacted]w[redacted]d             Complications: Abruptio Placenta   1 Term               Obstetric Comments   Her child born at 61 weeks died at 22 months old from Tracheal  stenosis.       Screening History:  Last mammogram: 02/08/2022 postsurgical changes in the upper outer quadrant of left breast, right breast negative.  Dexascan:  Repeat was done late last year or early this year.   Previous one:  10/20/2018   Osteopenia- improved since previous exam of 02/2012.  Colonoscopy: 52023-was negative.  Had had Colitis for 6 weeks before that.  CT scan showed the Colitis.  6 years ago-usually goes every 5 years.  Was due in 05/2021.  History of diverticulosis.  The patient is using vaginal hormone therapy only.  Patient denies post-menopausal vaginal bleeding.  Her urinary urge incontinence has worsened.   The patient wears seatbelts:yes  The patient participates in regular exercise: yes  Has the patient ever had a blood transfusion?:no  Lives with her husband.  She stays with her mother while she is in the [place] area.  Domestic violence:No    Depression Screening:       PHQ 9 Score: 0    Past Medical History:   Diagnosis Date  Acne     Actinic keratosis     Allergy history unknown     Anemia     Back pain     Resolved    Complication of anesthesia     Novacaine Dizzy    Depression     Dermatitis 02/09/2012    Dermatitis     Diverticulosis     GERD (gastroesophageal reflux disease)     Gout     Herpes     Genital    IBS (irritable bowel syndrome)     Inflammatory arthritis 03/07/2017    Lumbar stenosis     Migraine     Neuropathy     Pulmonary fibrosis     Recurrent sinus infections     STD (sexually transmitted disease)     Gential Herpes    Urticaria     Varicella         Past Surgical History:   Procedure Laterality Date    CESAREAN SECTION, CLASSIC  1985    ENDOMETRIAL ABLATION  2009    INCONTINENCE SURGERY  2010    NASAL POLYP SURGERY  2003    RECTOCELE REPAIR  2010    SPHINCTEROPLASTY  2010    ABSCESS AFTER SURGERY        Allergies   Allergen Reactions    Amoxicillin Hives    Demerol Nausea And Vomiting    Environmental Allergies     Erythromycin      ABDOMINAL PAIN    Gluten Meal       BLOATING, SNEEZING, WATERY EYES, NEUROPATHY FLARE    Sulfa Antibiotics Hives    Plaquenil [Hydroxychloroquine] Other (See Comments)     Blurred vision        Current Outpatient Medications on File Prior to Visit   Medication Sig Dispense Refill    fluconazole (DIFLUCAN) 150 mg tablet Take 1 tablet (150 mg total) by mouth every 3 days  for Infection caused by Fungus 2 tablet 0    levothyroxine (SYNTHROID, LEVOTHROID) 50 mcg tablet TAKE 1 TABLET BY MOUTH DAILY (BEFORE BREAKFAST) 90 tablet 1    fluticasone-salmeterol (ADVAIR/WIXELA) 100-50 mcg/dose diskus inhaler Advair Diskus 100 mcg-50 mcg/dose powder for inhalation      Cyanocobalamin (VITAMIN B-12) 2000 MCG TBCR       tiZANidine (ZANAFLEX) 4 mg tablet       Liotrix 15 (3.1-12.5) MG (MCG) TABS t3 sr capsules   TAKE ONE CAPSULE BY MOUTH EVERY MORNING      Cholecalciferol (VITAMIN D3 PO) Take by mouth  Drops 5,000 units po daily      valACYclovir (VALTREX) 1 GM tablet TAKE 1 TABLET DAILY 90 tablet 2    Multiple Vitamin (MULTI-VITAMIN DAILY PO) Take by mouth      mycophenolate (CELLCEPT) 500 MG tablet Take 2 tablets (1,000 mg total) by mouth 2 times daily      DULoxetine (CYMBALTA) 60 MG DR capsule Take 1 capsule (60 mg total) by mouth daily      omeprazole (PRILOSEC) 40 MG capsule Take 1 capsule (40 mg total) by mouth daily  BEFORE A MEAL  1    PROAIR HFA 108 (90 BASE) MCG/ACT inhaler Inhale 2 puffs into the lungs every 4-6 hours as needed  2    gabapentin (GABAPENTIN) 300 MG capsule Take 3 capsules (900 mg total) by mouth nightly 270 capsule 3    cetirizine (ZYRTEC) 10 MG tablet Take 1 tablet (10 mg total) by mouth daily  Probiotic Product (PROBIOTIC FORMULA PO) Take 1 capsule by mouth daily      DIGESTIVE ENZYMES PO Take 1 capsule by mouth daily      methylPREDNISolone (MEDROL) 4 MG tablet methylprednisolone 4 mg tablets in a dose pack       No current facility-administered medications on file prior to visit.       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    Review of Systems  Pertinent items are noted in HPI.       Objective:     BP 116/80   Temp 36.8 C (98.3 F)   Wt 59 kg (130 lb)   BMI 21.63 kg/m   General appearance: alert, appears stated age, and cooperative  Neck: no adenopathy, supple, symmetrical, trachea midline, and thyroid not enlarged, symmetric, no tenderness/mass/nodules  Lungs: clear to auscultation bilaterally  Breasts: normal appearance, no masses or tenderness  Heart: regular rate and rhythm, S1, S2 normal, no murmur, click, rub or gallop  Abdomen: soft, non-tender; bowel sounds normal; no masses,  no organomegaly  Pelvic: cervix normal in appearance, external genitalia normal, no adnexal masses or tenderness, no cervical motion tenderness, positive findings: vagina slightly atrophic, rectovaginal septum normal, and uterus normal size, shape, and consistency  Extremities: extremities normal, atraumatic, no cyanosis or edema  Skin: Skin  color, texture, turgor normal. No rashes or lesions  Lymph nodes: Cervical, supraclavicular, and axillary nodes normal.          Assessment:     Leandrea Scagnelli is a 66 y.o. female who presents for an annual GYN exam. She has the following concerns:   1. Well woman exam with routine gynecological exam        2. Postmenopausal state  TSH    T3, free    T4, free    T3, reverse    Estriol POWD      3. Menopausal syndrome  Estriol POWD      4. Vaginal atrophy  Estriol POWD      5. Hypothyroidism  TSH    T3, free    T4, free    T3, reverse      6. 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 Osteopenia        10. Constipation  TSH    T3, free    T4, free    T3, reverse      11. Urge urinary incontinence  Estriol POWD                 1. Well woman exam with routine gynecological exam  She is on Gabapentin 300 mg QID.      2. Postmenopausal state    - TSH; Future  - T3, free; Future  - T4, free; Future  - T3, reverse; Future  - Estriol POWD; Estriol 3 mg + DHEA 2.5 mg/ml versabase cream    Apply 1 ml to vulva or place 1 ml into the vagina twice weekly Disp: 30 ml  Dispense: 30 g; Refill: 1    3. Menopausal syndrome  Urinary Incontinence discussed.  Safety of vaginal hormones and Breast Cancer discussed.   - Estriol POWD; Estriol 3 mg + DHEA 2.5 mg/ml versabase cream    Apply 1 ml to vulva or place 1 ml into the vagina twice weekly Disp: 30 ml  Dispense: 30 g; Refill: 1  Rx sent electronically to [facility].   She is to let me know when she needs this filled in [place].     4. Vaginal atrophy    - Estriol POWD; Estriol 3 mg + DHEA 2.5 mg/ml versabase cream    Apply 1 ml to vulva or place 1 ml into the vagina twice weekly Disp: 30 ml  Dispense: 30 g; Refill: 1    5. Hypothyroidism    - TSH; Future  - T3, free; Future  - T4, free; Future  - T3, reverse; Future    6. History of breast cancer    7. 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 Osteopenia  Get copy of last Dexascan.  She will send them to [place].     10. Constipation    - TSH; Future  - T3, free; Future  - T4, free; Future  - T3, reverse; Future    11. Urge urinary incontinence    - Estriol POWD; Estriol 3 mg + DHEA 2.5 mg/ml versabase cream    Apply 1 ml to vulva or place 1 ml into the vagina twice weekly Disp: 30 ml  Dispense: 30 g; Refill: 1    12. Headaches discussed.  Start Vitamin B2 (Riboflavin).  See patient instructions.  If this doesn't help or work, she will let me know if she wants to try Fioricet.      I personally spent 55 minutes on the calendar day of the encounter, including pre and post visit work.    Return to care 1 year or PRN.    Susa Loffler, PA  02/24/2022

## 2022-02-24 NOTE — Patient Instructions (Addendum)
Pure Encapsulations, [facility] [employer], [facility], [place], [employer], [employer]  Try to add Rhodiola  For your headache-take Riboflavin 100 mg up to 400 mg for the headache.  You can try taking 100 mg orally daily, too, for prevention.     Please send a copy of your most recent bone density scan via MyChart so that I have it in our records.

## 2022-02-27 LAB — T3, REVERSE: T3, Reverse: 8.9 ng/dL — ABNORMAL LOW (ref 9.0–27.0)

## 2022-03-14 ENCOUNTER — Encounter: Payer: Self-pay | Admitting: Surgery

## 2022-05-27 IMAGING — MG MAMMOGRAPHY DIAGNOSTIC LEFT 3D TOMOSYNTH
6 of 10 series · 6 of 30 positions shown · non-contrast
Comparison: Comparison was made to prior examinations.

________________________________________________________________________________________________ 
MAMMOGRAPHY DIAGNOSTIC LEFT 3D TOMOSYNTH, LEFT BREAST ULTRASOUND UNILATERAL 
LIMITED, 05/27/2022 [DATE]: 
CLINICAL INDICATION: Left breast lump with pain and itching. Previous left 
breast lumpectomy and radiation therapy for breast cancer in February 2021
TECHNIQUE: Unilateral left breast digital mammography and 3-D Tomosynthesis were 
obtained. In addition, computer-aided detection was utilized. 
BREAST DENSITY: (Level B) There are scattered areas of fibroglandular density.

[L XCCL (1 of 2)]
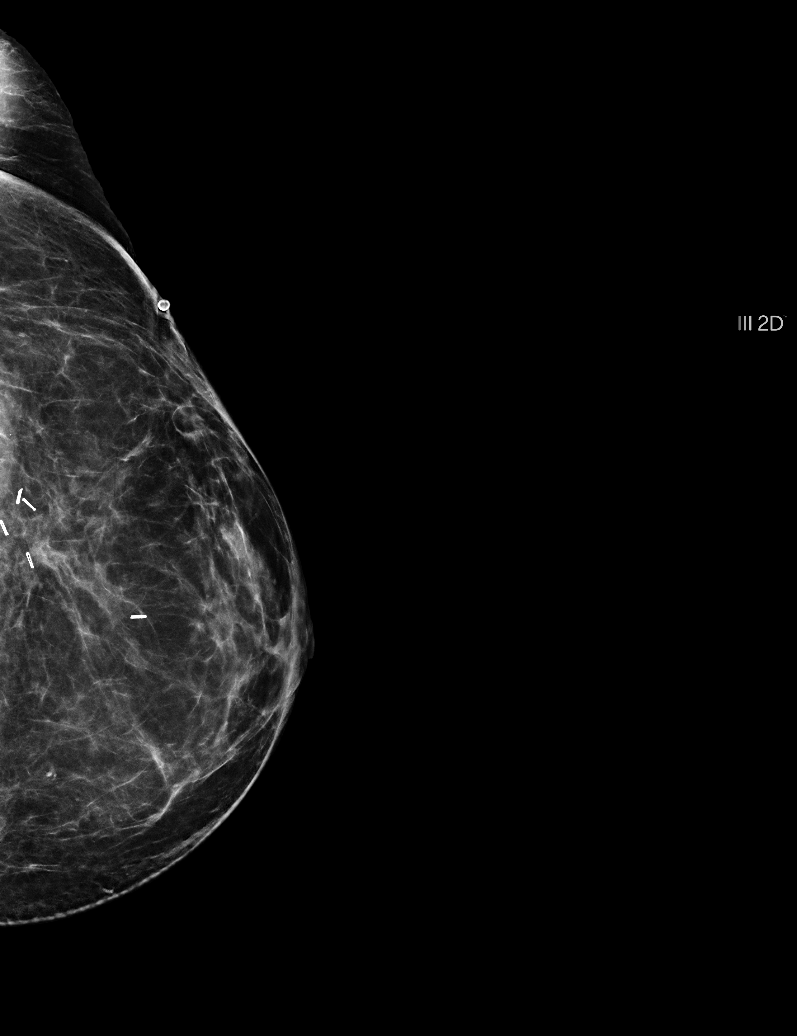

[L MLO (1 of 2)]
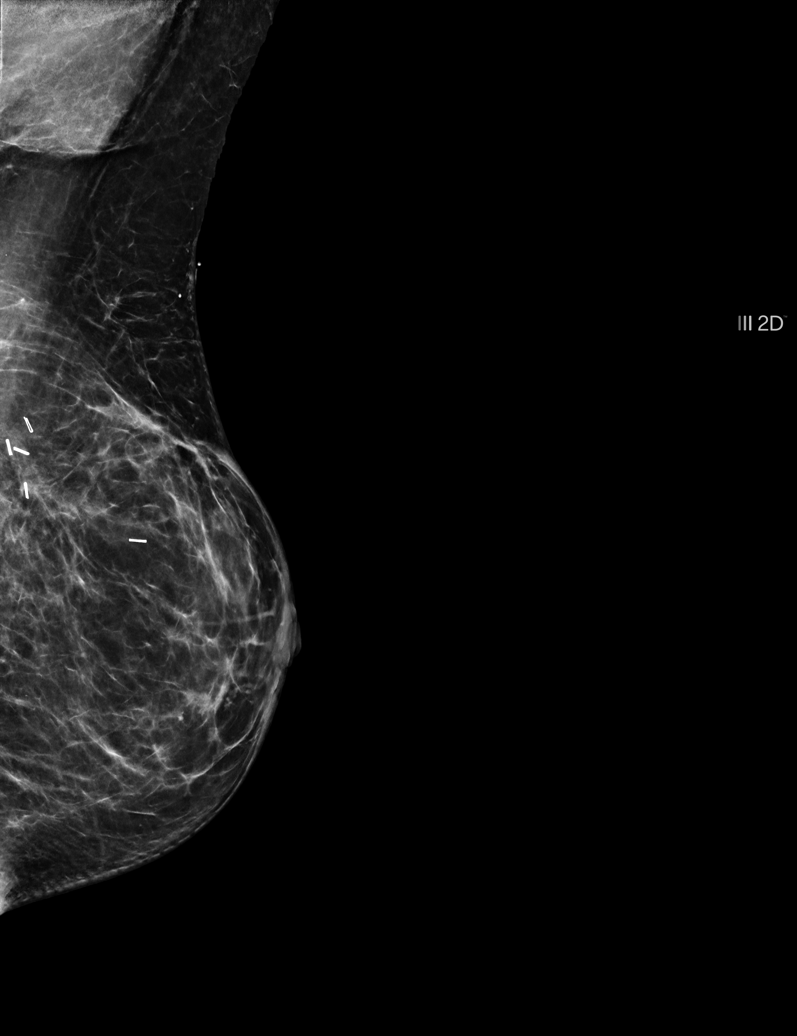

[L MLO (2 of 2)]
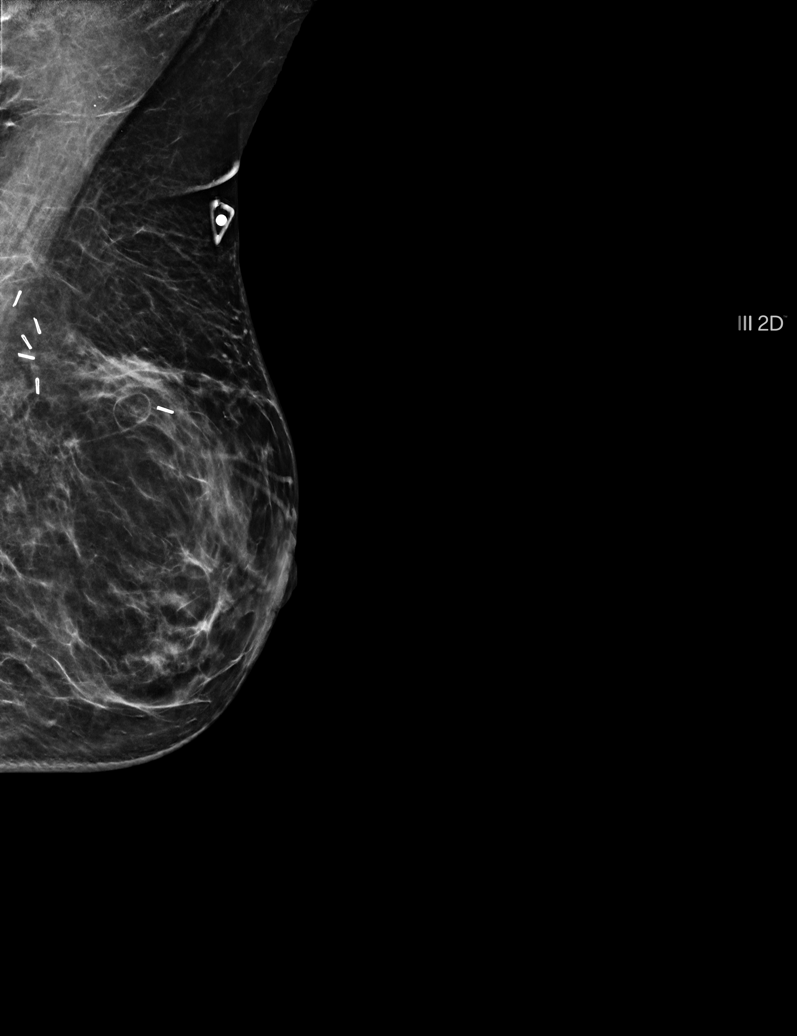

[L CC]
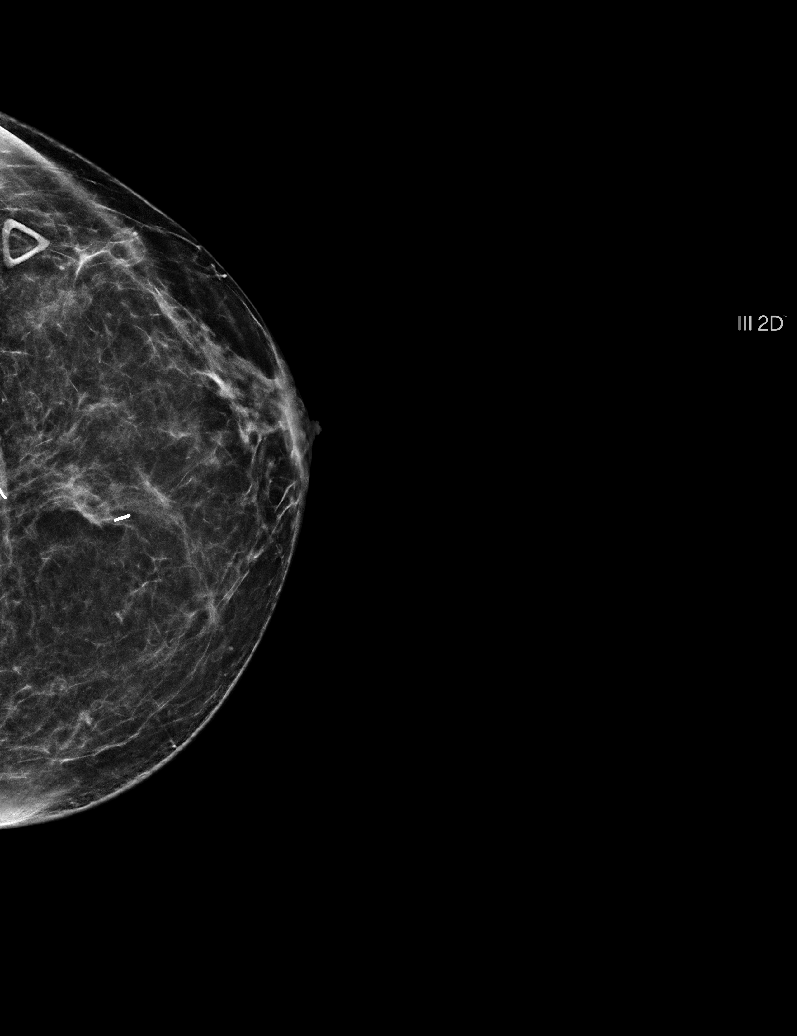

[L XCCL (2 of 2)]
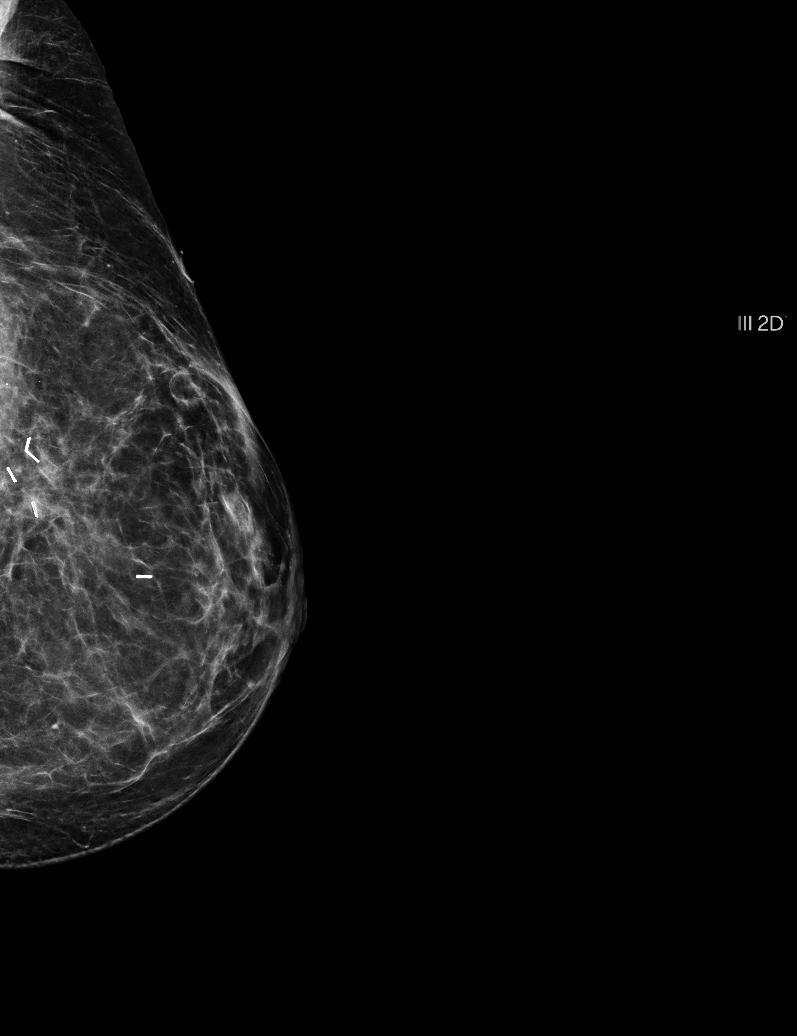

[L CC tomo · tomo slice 28/55.0]
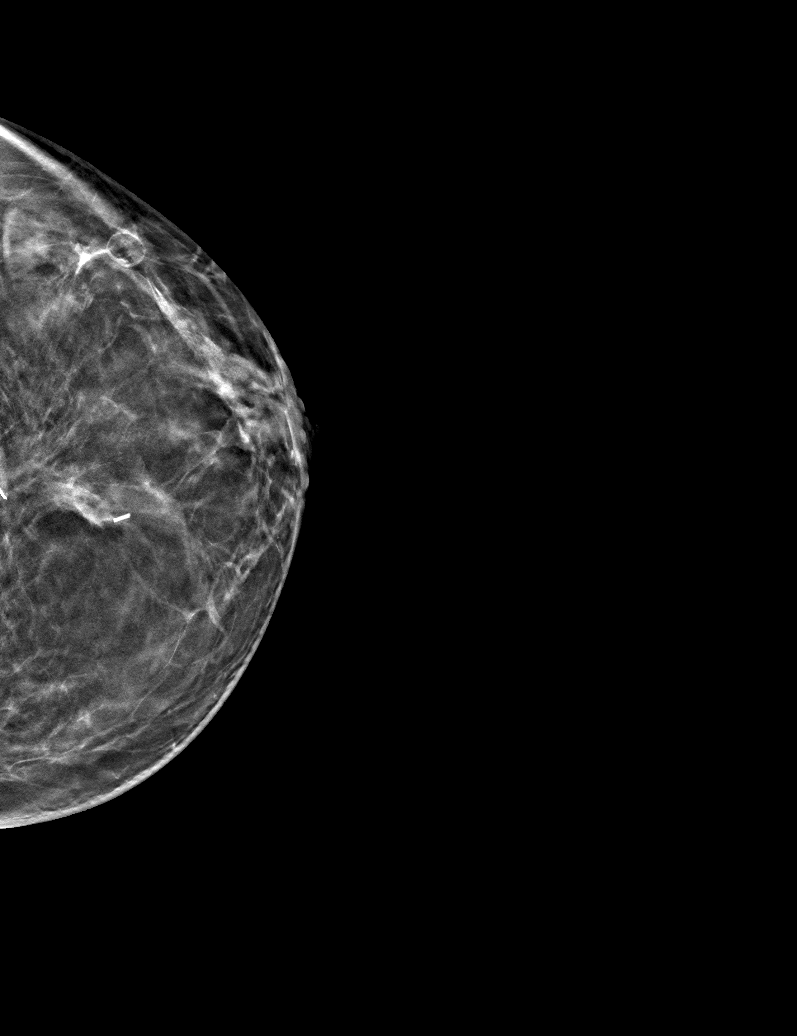

[6 of 30 positions shown; findings below may reference images not displayed]

FINDINGS: Stable scarring in the upper outer quadrant of the left breast with 
associated surgical clips and postsurgical changes. No new mass or abnormality 
seen in the left breast including site of reported lump and aging. 
On left breast ultrasound at site of lump and itching there is no discrete solid 
or cystic lesion at the [DATE] through [DATE] positions of the left breast. This 
appears to be away from site of scarring. Any palpable abnormality should be 
followed clinically.
IMPRESSION: (BI-RADS 2) Benign findings. Routine mammographic follow-up is recommended.

## 2022-07-07 IMAGING — CT CT CHEST WITHOUT CONTRAST
2 of 4 series · 15 of 36 positions shown, 18 images · non-contrast
Comparison: October 2020 CT scan

________________________________________________________________________________________________ 
CT CHEST WITHOUT CONTRAST, 07/07/2022 [DATE]: 
CLINICAL INDICATION: Occasional shortness of breath. History of left breast 
carcinoma January 2021. 
A search for DICOM formatted images was conducted for prior CT imaging studies 
completed at a non-affiliated media free facility.
TECHNIQUE: The chest was scanned from base of neck through the lung bases 
without contrast on a high resolution low dose CT scanner. Routine MPR and MIP 
reconstruction images were performed. Count of known CT and Cardiac Nuclear 
Medicine studies performed in the previous 12 months = 1.

[Series 2: chest 2.0 i31s 3 · axial · 0.64mm/px · z∈[-295,-27]mm · 12 of 150 slices shown, 15 images]
[im 8/150  mediastinal]
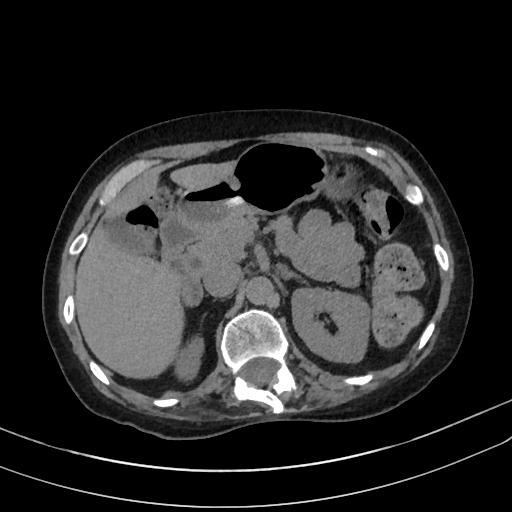
[im 8/150  lung]
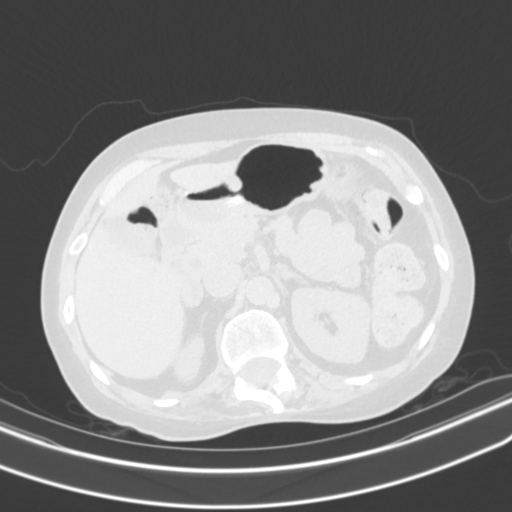
[im 22/150  lung]
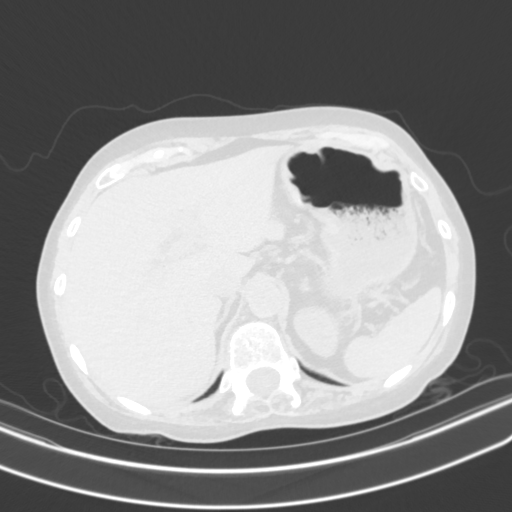
[im 36/150  lung]
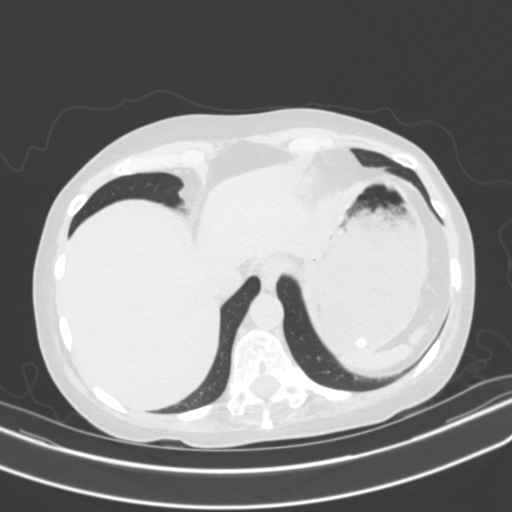
[im 43/150  lung]
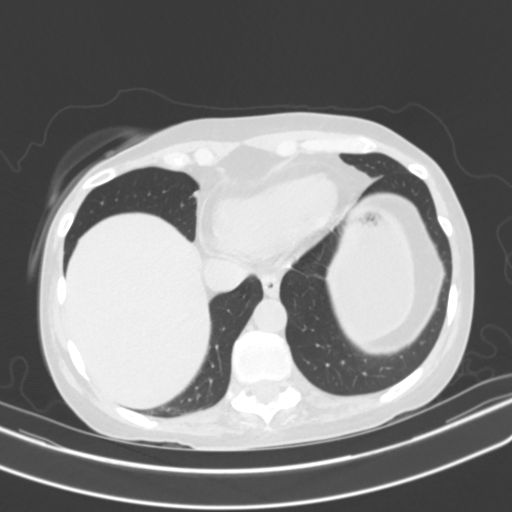
[im 57/150  mediastinal]
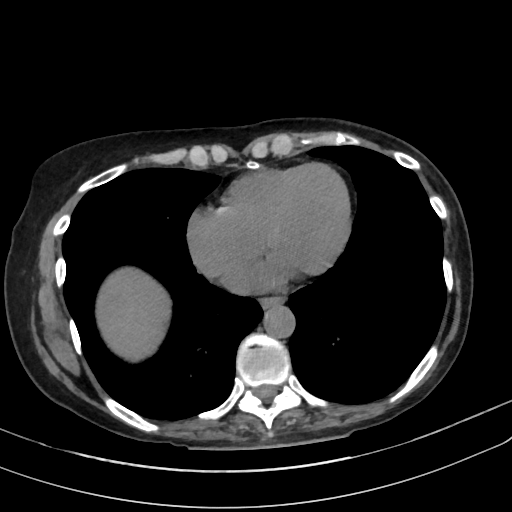
[im 57/150  lung]
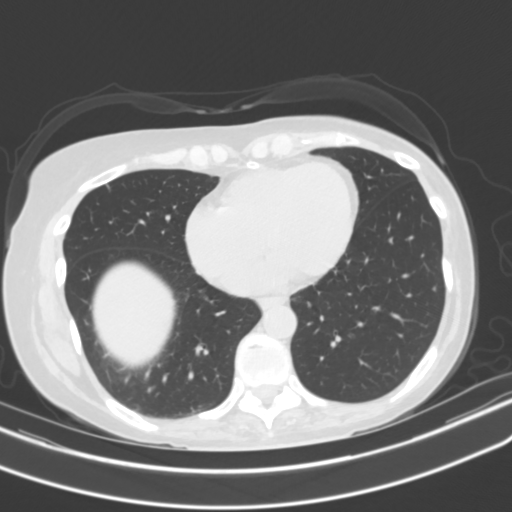
[im 71/150  lung]
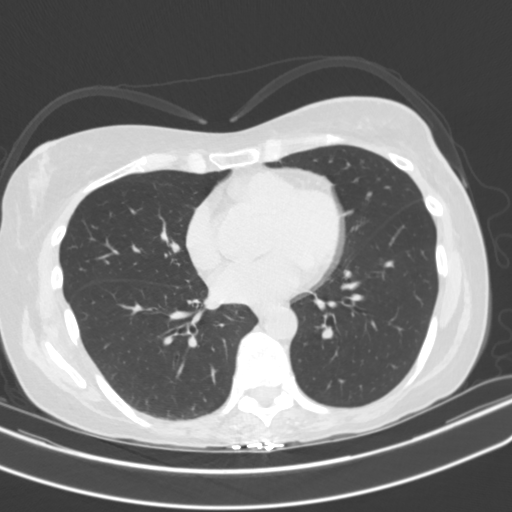
[im 79/150  lung]
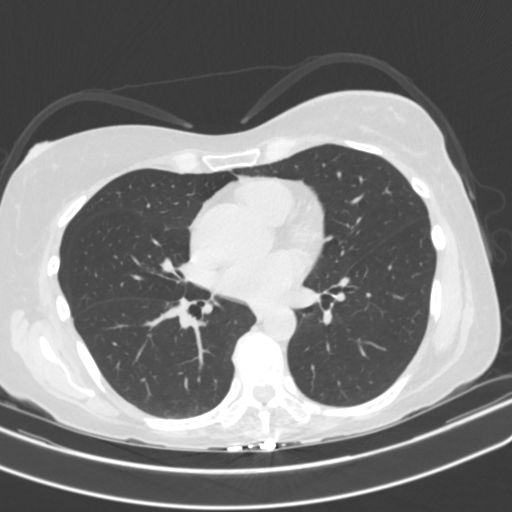
[im 93/150  lung]
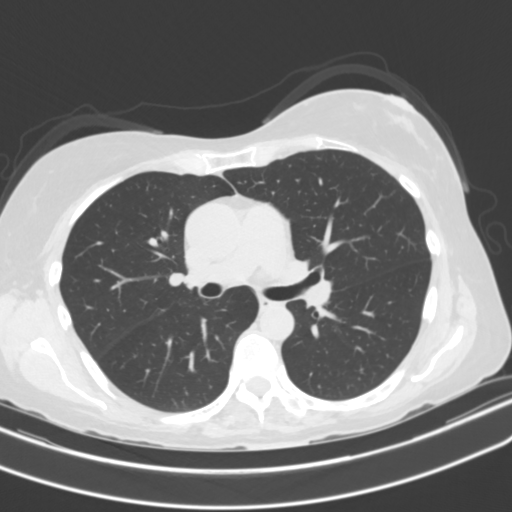
[im 107/150  mediastinal]
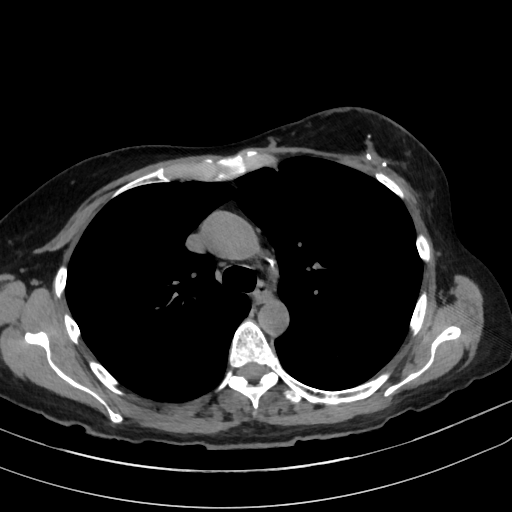
[im 107/150  lung]
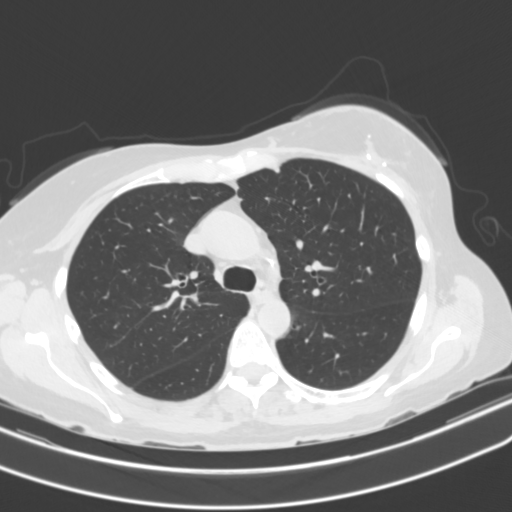
[im 114/150  lung]
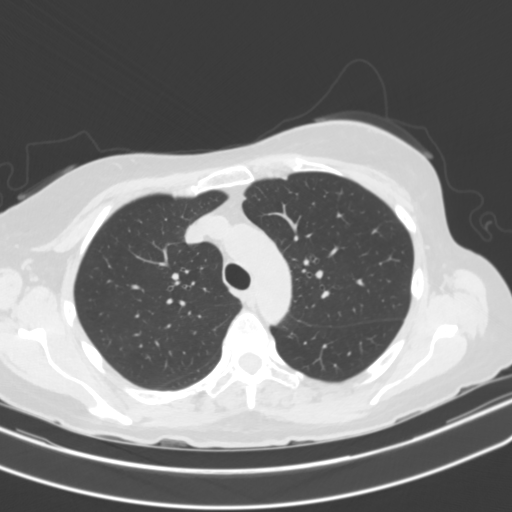
[im 128/150  lung]
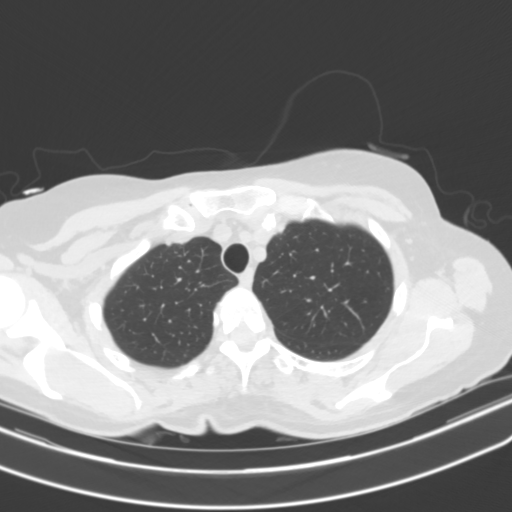
[im 142/150  lung]
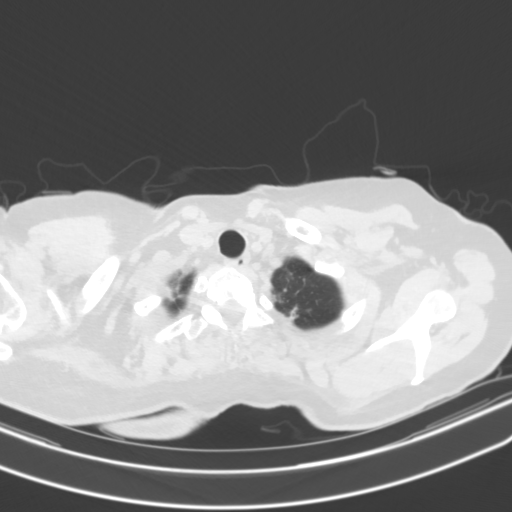

[Series 4: coronal · coronal · 0.61mm/px · 3 of 115 slices shown]
[im 23/115  lung]
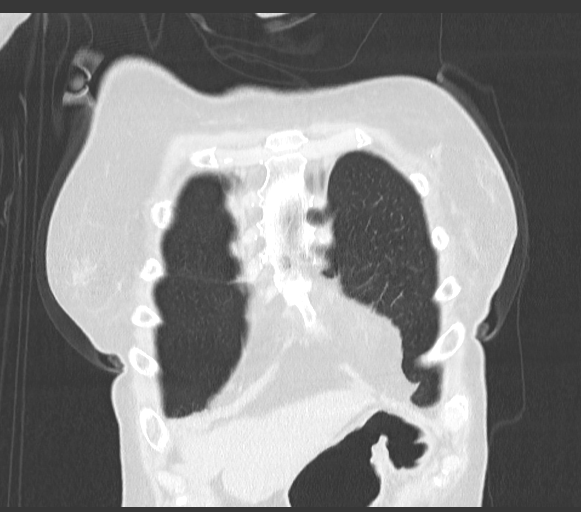
[im 46/115  lung]
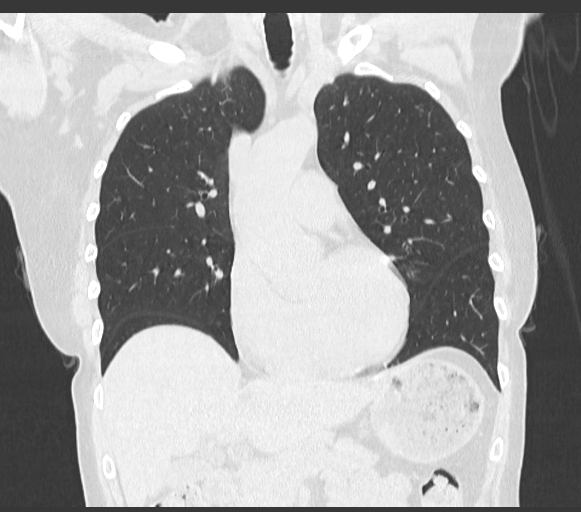
[im 69/115  lung]
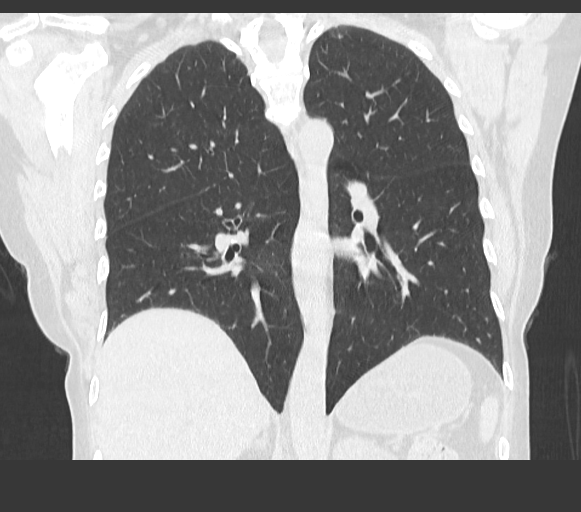

[15 of 36 positions shown; findings below may reference images not displayed]

FINDINGS: LUNGS AND PLEURA:  Stable micronodules identified most pronounced within the 
right apex unchanged since October 2020 consistent with benign disease. No new or 
developing mass or pleural effusion identified. 
MEDIASTINUM:  No adenopathy. Normal heart size. No pericardial effusion. No 
coronary artery calcifications noted. 
CHEST WALL/AXILLA: No mass or adenopathy.  
UPPER ABDOMEN: Negative. 
MUSCULOSKELETAL: Mild degenerative changes. No fracture or destructive changes 
seen.
IMPRESSION: Stable micronodules since October 2020 consistent with benign disease. 
RADIATION DOSE REDUCTION: All CT scans are performed using radiation dose 
reduction techniques, when applicable.  Technical factors are evaluated and 
adjusted to ensure appropriate moderation of exposure.  Automated dose 
management technology is applied to adjust the radiation doses to minimize 
exposure while achieving diagnostic quality images.

## 2022-07-13 IMAGING — MR MRI LEFT ANKLE WITHOUT CONTRAST
3 of 5 series · 7 of 40 positions shown · IV contrast (gadolinium)
Comparison: 04/09/2022 [facility] radiographs and 11/01/2021 [facility] MRI

________________________________________________________________________________________________ 
MRI LEFT ANKLE WITHOUT CONTRAST, 07/13/2022 [DATE]: 
CLINICAL INDICATION: Posterior tibial tendinitis, left leg.
TECHNIQUE: Multiplanar, multiecho position MR images of the ankle were performed 
without intravenous gadolinium enhancement. Patient was scanned on a
magnet.

[Series 201: survey_left · axial · 10.0mm · 0.65mm/px · z∈[-20,+125]mm · 2 of 9 slices shown]
[im 1/9]
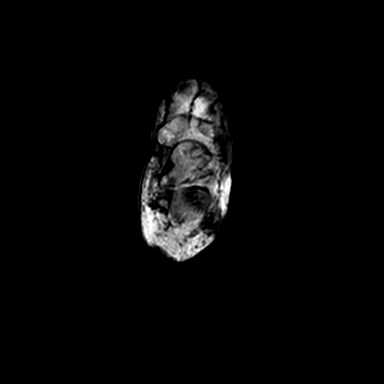
[im 9/9]
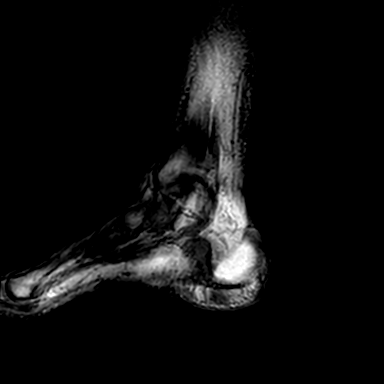

[Series 301: (person_name)_(person_name)_(person_name) · axial · 3.0mm · 0.18mm/px · z∈[-57,+48]mm · 3 of 42 slices shown]
[im 8/42]
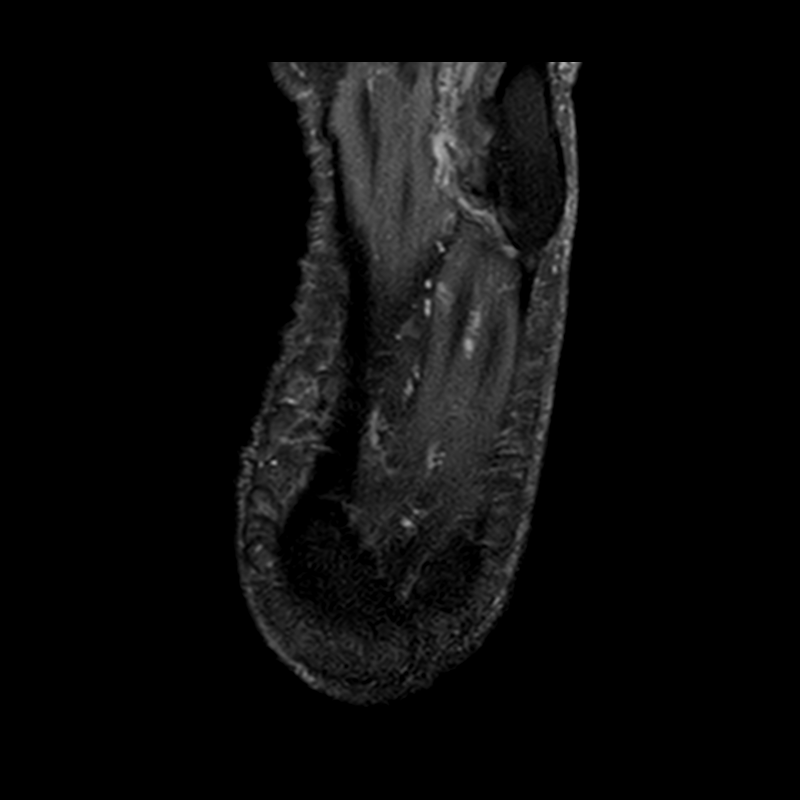
[im 23/42]
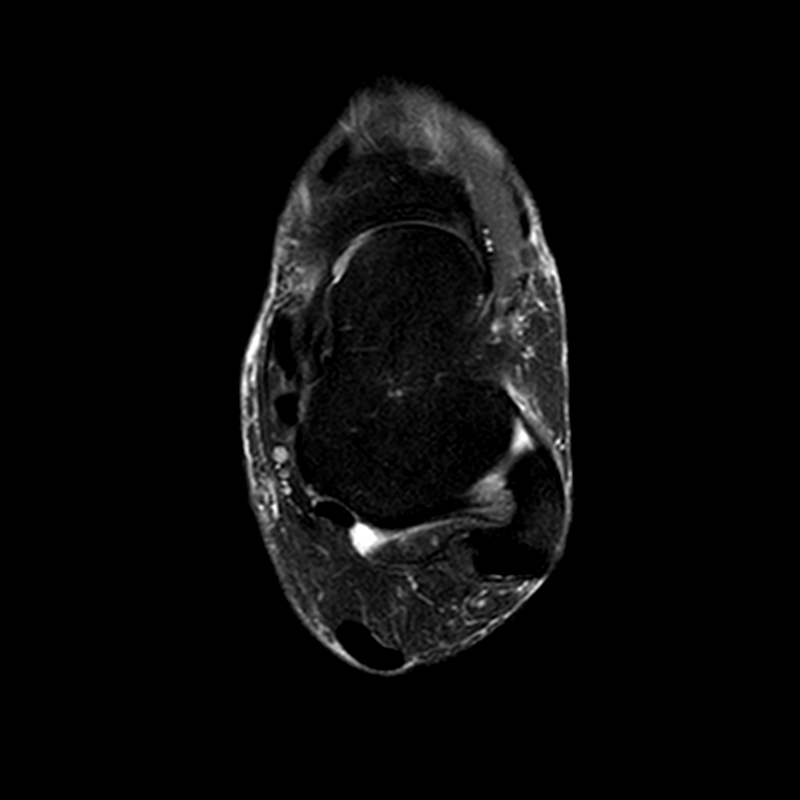
[im 38/42]
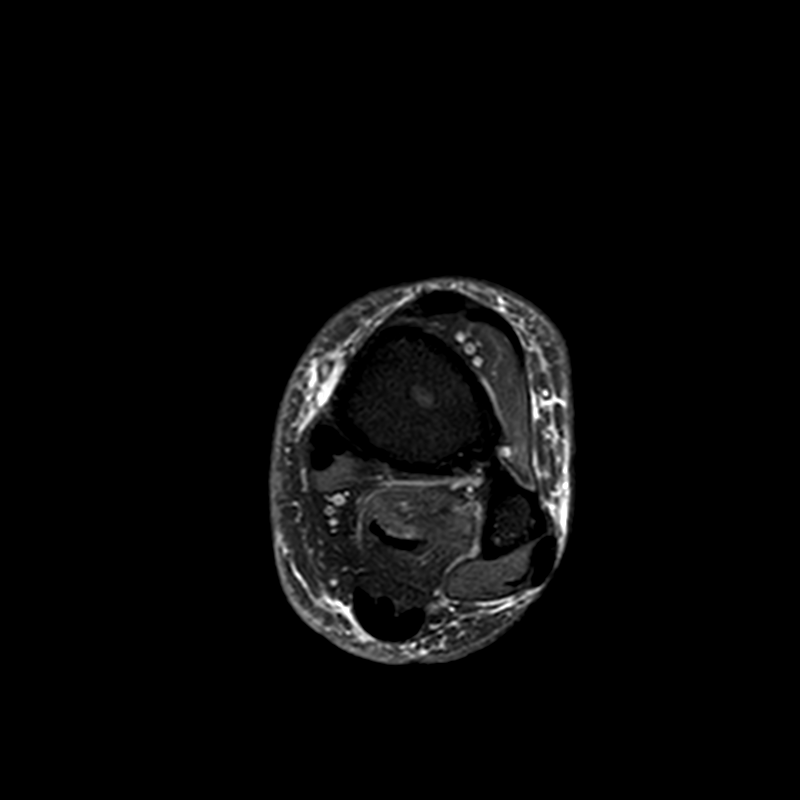

[Series 401: t1_sag · sagittal · 2.5mm · 0.16mm/px · 2 of 27 slices shown]
[im 4/27]
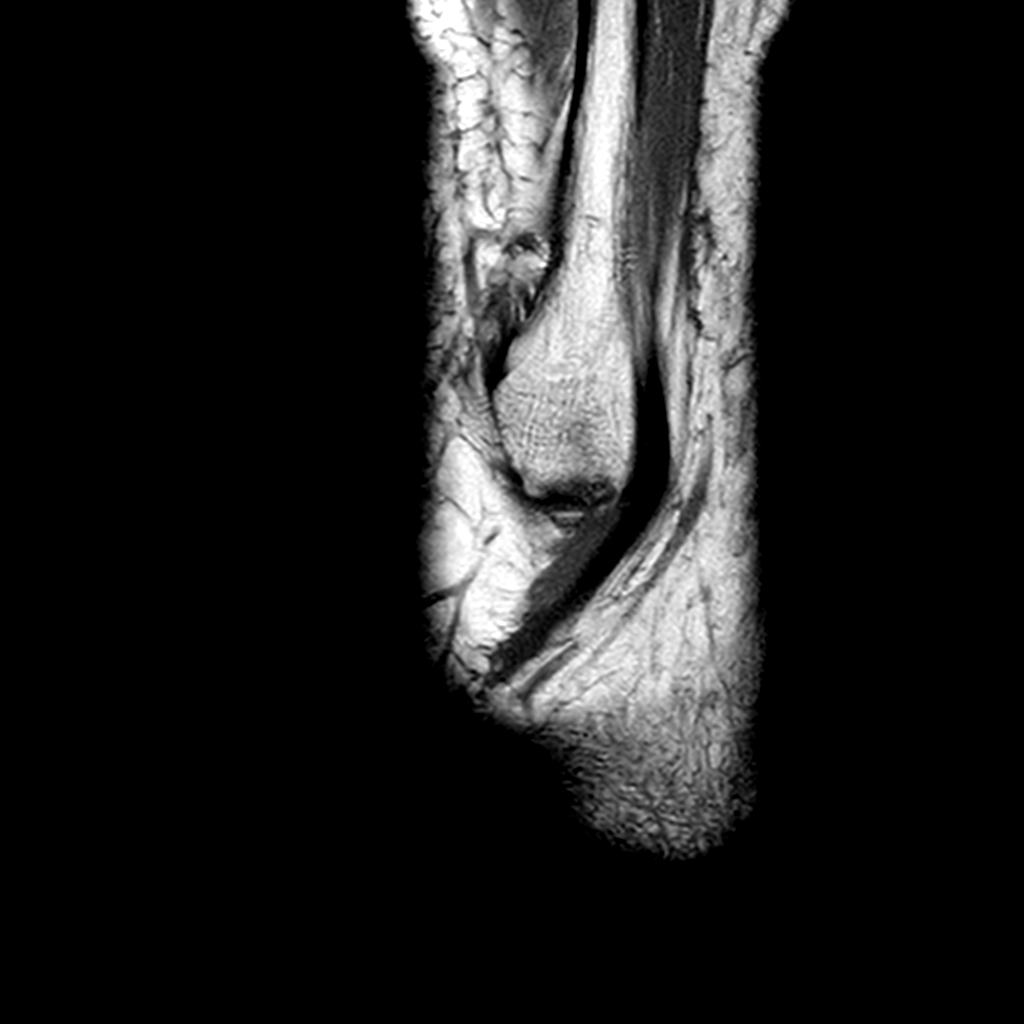
[im 15/27]
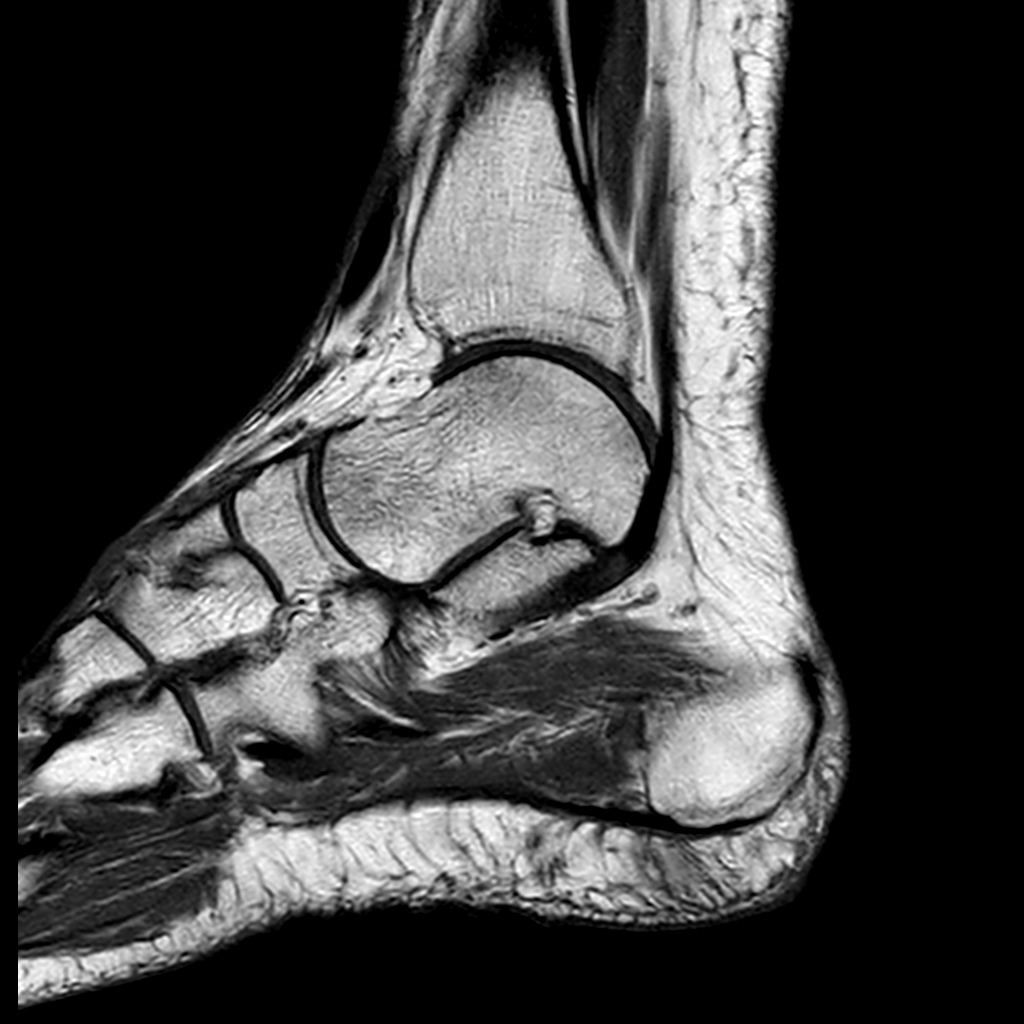

[7 of 40 positions shown; findings below may reference images not displayed]

FINDINGS: TENDONS: Normal posterior tibial tendon. Small amount of fluid in the peroneal 
sheath, consistent with tenosynovitis. No definite peroneus brevis split tear 
identified on current study. The flexor, extensor and peroneal tendons are 
intact. No tendon subluxation. The Achilles tendon is preserved. 
LIGAMENTS:  
LATERAL LIGAMENTS: The anterior talofibular ligament is intact. The 
calcaneofibular ligament and posterior talofibular ligament are preserved. 
SYNDESMOTIC LIGAMENTS: The anterior and posterior tibiofibular and interosseous 
ligaments are preserved. 
DELTOID LIGAMENTOUS COMPLEX: The deep and superficial components of the deltoid 
ligamentous complex are intact. 
SINUS TARSI LIGAMENTS: The cervical and interosseous ligaments are preserved. 
The inferior extensor retinaculum appears intact.  
BHEBHE LIGAMENTOUS COMPLEX: Plantar calcaneonavicular ligament preserved. 
BONES AND JOINTS: Stable 0.4 cm medial malleolar subcortical cyst and adjacent 
marrow edema with overlying skin marker indicating this is at the site of 
symptoms. Small ankle joint effusion. No fracture, contusion or stress response. 
The talar dome is preserved. No focal osteochondral lesion. Articular cartilage 
is preserved. 
ADDITIONAL FINDINGS: Marked fatty infiltration of the flexor hallucis longus 
muscle. Sinus tarsi fat is preserved. Plantar fascia is intact. Tarsal tunnel is 
preserved. Mild subcutaneous soft tissue swelling.
IMPRESSION: 1.  Normal posterior tibial tendon.  
2.  Stable 0.4 cm medial malleolar subcortical cyst and adjacent marrow edema.  
3.  Small ankle joint effusion and mild peroneal tenosynovitis.  
4.  Marked fatty infiltration of the flexor hallucis longus muscle.  
5.  Mild subcutaneous soft tissue swelling.

## 2022-09-06 DIAGNOSIS — G894 Chronic pain syndrome: Secondary | ICD-10-CM | POA: Insufficient documentation

## 2022-11-10 HISTORY — PX: ANKLE SURGERY: SHX546

## 2023-01-17 DIAGNOSIS — L503 Dermatographic urticaria: Secondary | ICD-10-CM | POA: Insufficient documentation

## 2023-02-10 ENCOUNTER — Other Ambulatory Visit: Payer: Self-pay | Admitting: Gastroenterology

## 2023-03-01 ENCOUNTER — Ambulatory Visit: Payer: Medicare Other | Attending: Surgery | Admitting: Surgery

## 2023-03-01 ENCOUNTER — Other Ambulatory Visit: Payer: Self-pay

## 2023-03-01 ENCOUNTER — Encounter: Payer: Self-pay | Admitting: Surgery

## 2023-03-01 VITALS — BP 142/90 | Temp 98.1°F | Ht 64.0 in | Wt 130.0 lb

## 2023-03-01 DIAGNOSIS — N951 Menopausal and female climacteric states: Secondary | ICD-10-CM | POA: Insufficient documentation

## 2023-03-01 DIAGNOSIS — B009 Herpesviral infection, unspecified: Secondary | ICD-10-CM | POA: Insufficient documentation

## 2023-03-01 DIAGNOSIS — Z780 Asymptomatic menopausal state: Secondary | ICD-10-CM | POA: Insufficient documentation

## 2023-03-01 DIAGNOSIS — N952 Postmenopausal atrophic vaginitis: Secondary | ICD-10-CM | POA: Insufficient documentation

## 2023-03-01 DIAGNOSIS — Z853 Personal history of malignant neoplasm of breast: Secondary | ICD-10-CM | POA: Insufficient documentation

## 2023-03-01 DIAGNOSIS — E039 Hypothyroidism, unspecified: Secondary | ICD-10-CM | POA: Insufficient documentation

## 2023-03-01 DIAGNOSIS — Z124 Encounter for screening for malignant neoplasm of cervix: Secondary | ICD-10-CM | POA: Insufficient documentation

## 2023-03-01 NOTE — Progress Notes (Signed)
GYN ANNUAL EXAM:     [name] is a 67 y.o. G64P1101 postmenopausal female who presents for an annual GYN exam. The patient complains of left ankle pain and had to have surgery.   She has a history of left breast cancer.  It was diagnosed in late summer/early fall 2022.  She lives in [place], [place] and visits family here in [place] every year in the Fall.  She used to live in [place].  She has to use steroids about every 3 months.   She stopped her vaginal hormones for a bit while she had a cyst in her breast.   She is on Tamoxifen.   She is supposed to be on it for 5 years.   Her oncologist does not want her on any hormones.   She sees a functional medicine doctor in [place], and she saw her in 12/2022.  She had extensive lab work done.  Her   Omega 3's were increased.  Her thyroid medication was raised to 62.5 mg, but it made her too irritable.     GYN History  LMP: Endometrial ablation.  LMP probably around age 36-05/2007.  Menarche: 67 yo:   Last pap smear: Date: 02/27/2021   Results: NILM with negative high risk HPV  History of abnormal pap smear: No.   The patient is sexually active.   Sexually active: single partner, contraception - post menopausal status  Together with partner: 70 years-married  STD History: HSV 2  Current contraception: post menopausal status  HPV vaccine series: Not received    OB History   Gravida Para Term Preterm AB Living   2 2 1 1  0 1   SAB IAB Ectopic Multiple Live Births   0 0 0 0        # Outcome Date GA Lbr Len/2nd Weight Sex Type Anes PTL Lv   2 Preterm  [redacted]w[redacted]d             Complications: Abruptio Placenta   1 Term               Obstetric Comments   Her child born at 37 weeks died at 56 months old from Tracheal stenosis.       Screening History:  Last mammogram: 02/10/2023 scattered areas of fibroglandular density.  Stable postsurgical changes in left breast.  U/S showed a subjacent oil cyst noted in left breast.  BI-RADS 2.  She had had an U/S done in [place]  Dexascan:  10/20/2018   Osteopenia.  Improved since previous exam  of 02/2012.  Has had another DXA since the 5/20 one, but report not available.    Colonoscopy: 09/2021-was negative. Had had Colitis for 6 weeks before that. CT scan showed the Colitis. 6 years ago-usually goes every 5 years. Was due in 05/2021. History of diverticulosis.   The patient is using vaginal hormone replacement therapy. Patient denies post-menopausal vaginal bleeding.  The patient wears seatbelts:yes  The patient participates in regular exercise: yes  Has the patient ever had a blood transfusion?:no  Lives with her husband.    Stays with her mother while in [place].   Domestic violence:No    Depression Screening:       PHQ 9 Score: 8    Past Medical History:   Diagnosis Date    Acne     Actinic keratosis     Allergy history unknown     Anemia     Back pain     Resolved  Complication of anesthesia     Novacaine Dizzy    Depression     Dermatitis 02/09/2012    Dermatitis     Diverticulosis     GERD (gastroesophageal reflux disease)     Gout     Herpes     Genital    IBS (irritable bowel syndrome)     Inflammatory arthritis 03/07/2017    Lumbar stenosis     Migraine     Neuropathy     Pulmonary fibrosis     Recurrent sinus infections     STD (sexually transmitted disease)     Gential Herpes    Urticaria     Varicella         Past Surgical History:   Procedure Laterality Date    ANKLE SURGERY Left 11/10/2022    cyst removed from behind a bone    CESAREAN SECTION, CLASSIC  06/01/1983    ENDOMETRIAL ABLATION  06/01/2007    INCONTINENCE SURGERY  05/31/2008    NASAL POLYP SURGERY  05/31/2001    RECTOCELE REPAIR  05/31/2008    SPHINCTEROPLASTY  05/31/2008    ABSCESS AFTER SURGERY        Allergies   Allergen Reactions    Amoxicillin Hives    Demerol Nausea And Vomiting    Environmental Allergies     Erythromycin      ABDOMINAL PAIN    Gluten Meal      BLOATING, SNEEZING, WATERY EYES, NEUROPATHY FLARE    Sulfa Antibiotics Hives    Plaquenil [Hydroxychloroquine]  Other (See Comments)     Blurred vision        Current Outpatient Medications on File Prior to Visit   Medication Sig Dispense Refill    Estriol POWD Estriol 3 mg + DHEA 2.5 mg/ml versabase cream    Apply 1 ml to vulva or place 1 ml into the vagina twice weekly Disp: 30 ml 30 g 1    levothyroxine (SYNTHROID, LEVOTHROID) 50 mcg tablet TAKE 1 TABLET BY MOUTH DAILY (BEFORE BREAKFAST) 90 tablet 1    fluticasone-salmeterol (ADVAIR/WIXELA) 100-50 mcg/dose diskus inhaler Advair Diskus 100 mcg-50 mcg/dose powder for inhalation      methylPREDNISolone (MEDROL) 4 MG tablet methylprednisolone 4 mg tablets in a dose pack      Cyanocobalamin (VITAMIN B-12) 2000 MCG TBCR       tiZANidine (ZANAFLEX) 4 mg tablet       Liotrix 15 (3.1-12.5) MG (MCG) TABS t3 sr capsules   TAKE ONE CAPSULE BY MOUTH EVERY MORNING      Cholecalciferol (VITAMIN D3 PO) Take by mouth  Drops 5,000 units po daily      valACYclovir (VALTREX) 1 GM tablet TAKE 1 TABLET DAILY 90 tablet 2    Multiple Vitamin (MULTI-VITAMIN DAILY PO) Take by mouth      mycophenolate (CELLCEPT) 500 MG tablet Take 2 tablets (1,000 mg total) by mouth 2 times daily.      DULoxetine (CYMBALTA) 60 MG DR capsule Take 1 capsule (60 mg total) by mouth daily.      omeprazole (PRILOSEC) 40 MG capsule Take 1 capsule (40 mg total) by mouth daily. BEFORE A MEAL  1    PROAIR HFA 108 (90 BASE) MCG/ACT inhaler Inhale 2 puffs into the lungs every 4-6 hours as needed.  2    gabapentin (GABAPENTIN) 300 MG capsule Take 3 capsules (900 mg total) by mouth nightly 270 capsule 3    cetirizine (ZYRTEC) 10 MG tablet Take 1 tablet (  10 mg total) by mouth daily.      Probiotic Product (PROBIOTIC FORMULA PO) Take 1 capsule by mouth daily      DIGESTIVE ENZYMES PO Take 1 capsule by mouth daily       No current facility-administered medications on file prior to visit.       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    Review of Systems  Pertinent items are noted in HPI.    Using a stroller walking chair due to her left ankle surgery/pain     Objective:     BP 142/90   Temp 36.7 C (98.1 F)   Ht 1.626 m (5\' 4" )   Wt 59 kg (130 lb)   BMI 22.31 kg/m   General appearance: alert, appears stated age, and cooperative  Neck: no adenopathy, supple, symmetrical, trachea midline, and thyroid not enlarged, symmetric, no tenderness/mass/nodules  Lungs: clear to auscultation bilaterally  Breasts: normal appearance, no masses or tenderness  Heart: regular rate and rhythm, S1, S2 normal, no murmur, click, rub or gallop  Abdomen: soft, non-tender; bowel sounds normal; no masses,  no organomegaly  Pelvic: cervix normal in appearance, no adnexal masses or tenderness, no cervical motion tenderness, positive findings: vagina atrophic, small sebaceous cyst right vulva to leg crease area, rectovaginal septum normal, and uterus normal size, shape, and consistency  Extremities: extremities normal, atraumatic, no cyanosis or edema  Skin:  Skin color, texture, turgor normal. No rashes or lesions  Lymph nodes: Cervical, supraclavicular, and axillary nodes normal.    Utilizing a shared decision model, a pelvic exam was performed today as indicated by screening recommendations, medical history and/or symptoms.     Examination chaperoned by KD MA.     Assessment:     [name] is a 67 y.o. female who presents for an annual GYN exam. She has the following concerns:   1. Postmenopausal state  GYN Cytology      2. Menopausal syndrome        3. History of breast cancer  GYN Cytology      4. Vaginal atrophy        5. 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  Pap smear for cervical cancer screening  GYN Cytology                 1. Postmenopausal state  Get copy of last Dexascan.  Consider repeat it now.  Goes back to [place] in early November.   - GYN Cytology; Future    2. Menopausal syndrome  Continue vaginal hormones.  Her functional medicine doctor orders them.     3. History of breast cancer  Considering going off Tamoxifen at the 3 year mark.   - GYN Cytology; Future    4. Vaginal atrophy    5. 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 Pap smear for cervical cancer screening    - GYN Cytology; Future       All questions answered to patient's satisfaction. Encouraged to call office with any questions/concerns that arise.     Healthy practices for women information given.     I personally spent 45 minutes on the calendar day of the encounter, including pre and post visit work.      Return to care 1 year or PRN.    Susa Loffler, PA  03/01/2023

## 2023-03-02 ENCOUNTER — Encounter: Payer: Self-pay | Admitting: Gastroenterology

## 2023-03-02 ENCOUNTER — Encounter: Payer: Self-pay | Admitting: Surgery

## 2023-03-02 DIAGNOSIS — M8580 Other specified disorders of bone density and structure, unspecified site: Secondary | ICD-10-CM | POA: Insufficient documentation

## 2023-03-09 LAB — GYN CYTOLOGY

## 2023-04-11 IMAGING — MR MRI LEFT ANKLE WITHOUT CONTRAST
4 of 6 series · 10 of 40 positions shown · non-contrast
Comparison: 07/13/2022

________________________________________________________________________________________________ 
MRI LEFT ANKLE WITHOUT CONTRAST, 04/11/2023 [DATE]: 
CLINICAL INDICATION: Pain In Left Ankle And Joints Of Left Foot
TECHNIQUE: Multiplanar, multisequence imaging of the ankle obtained without 
contrast. Patient was scanned on a 1.5T magnet.

[Series 201: survey_left · axial · 10.0mm · 0.65mm/px · z∈[-20,+125]mm · 3 of 9 slices shown (1 of 2)]
[im 1/9]
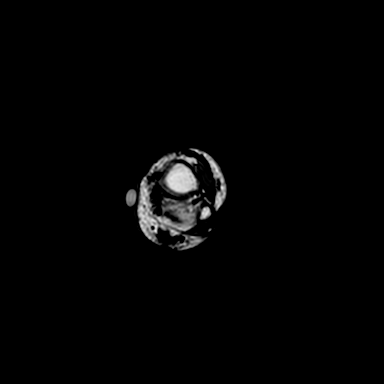
[im 5/9]
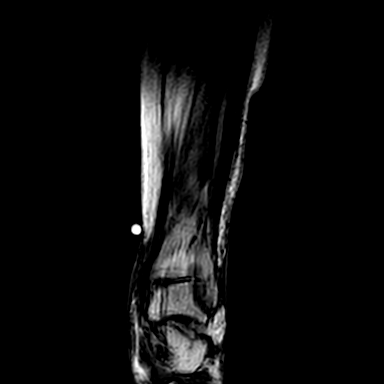
[im 9/9]
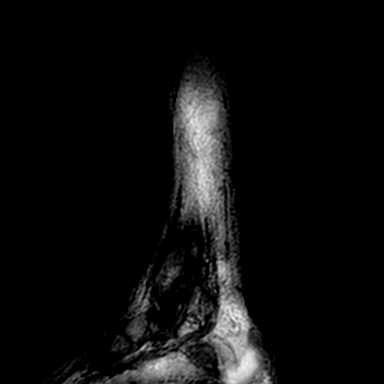

[Series 301: survey_left · axial · 10.0mm · 0.65mm/px · z∈[-87,+63]mm · 2 of 9 slices shown (2 of 2)]
[im 1/9]
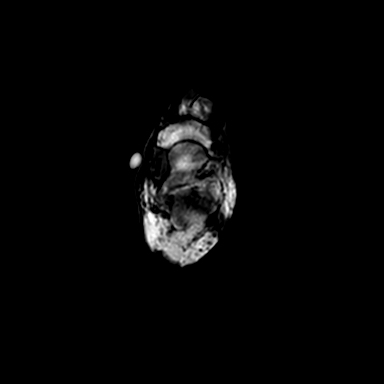
[im 9/9]
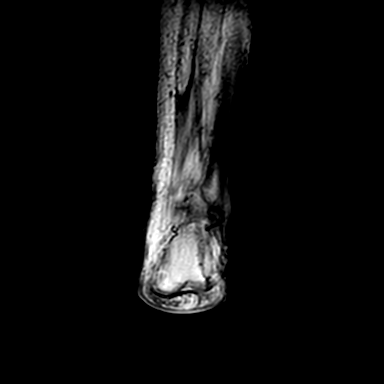

[Series 401: (person_name)_(person_name)_(person_name) · axial · 3.0mm · 0.18mm/px · z∈[-127,-15]mm · 3 of 42 slices shown]
[im 5/42]
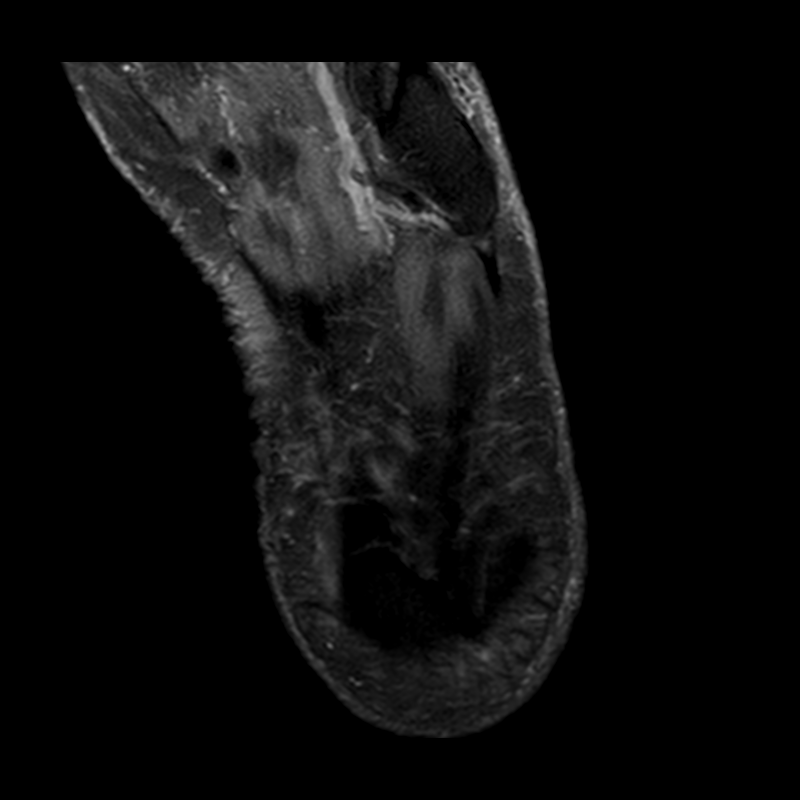
[im 21/42]
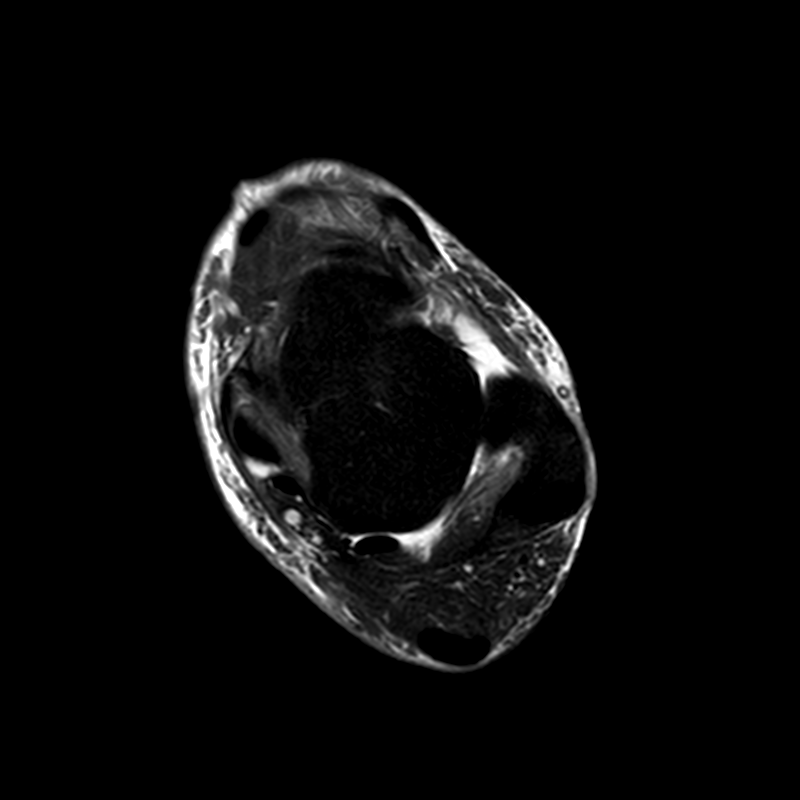
[im 37/42]
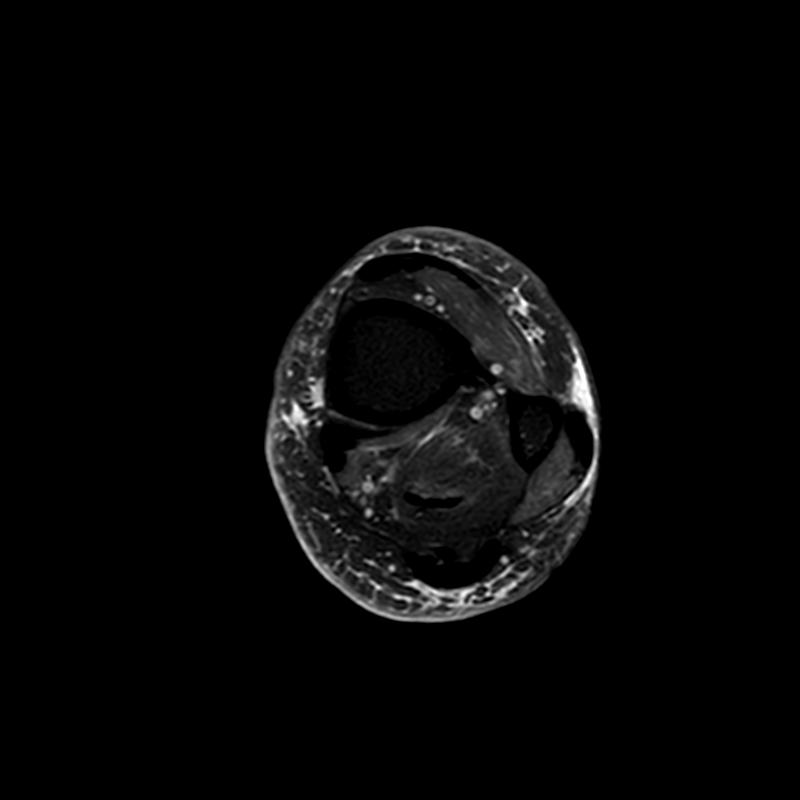

[Series 501: t1_sag · sagittal · 2.5mm · 0.16mm/px · 2 of 27 slices shown]
[im 5/27]
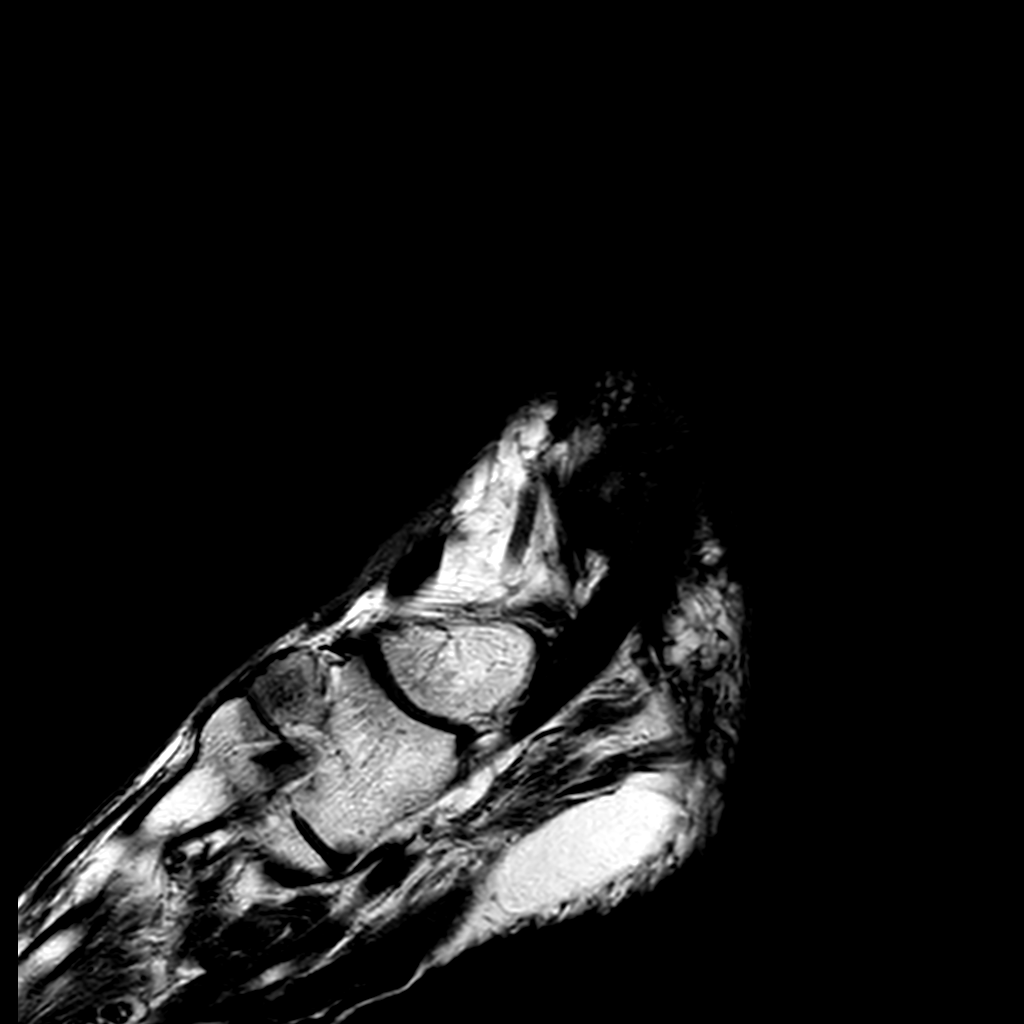
[im 14/27]
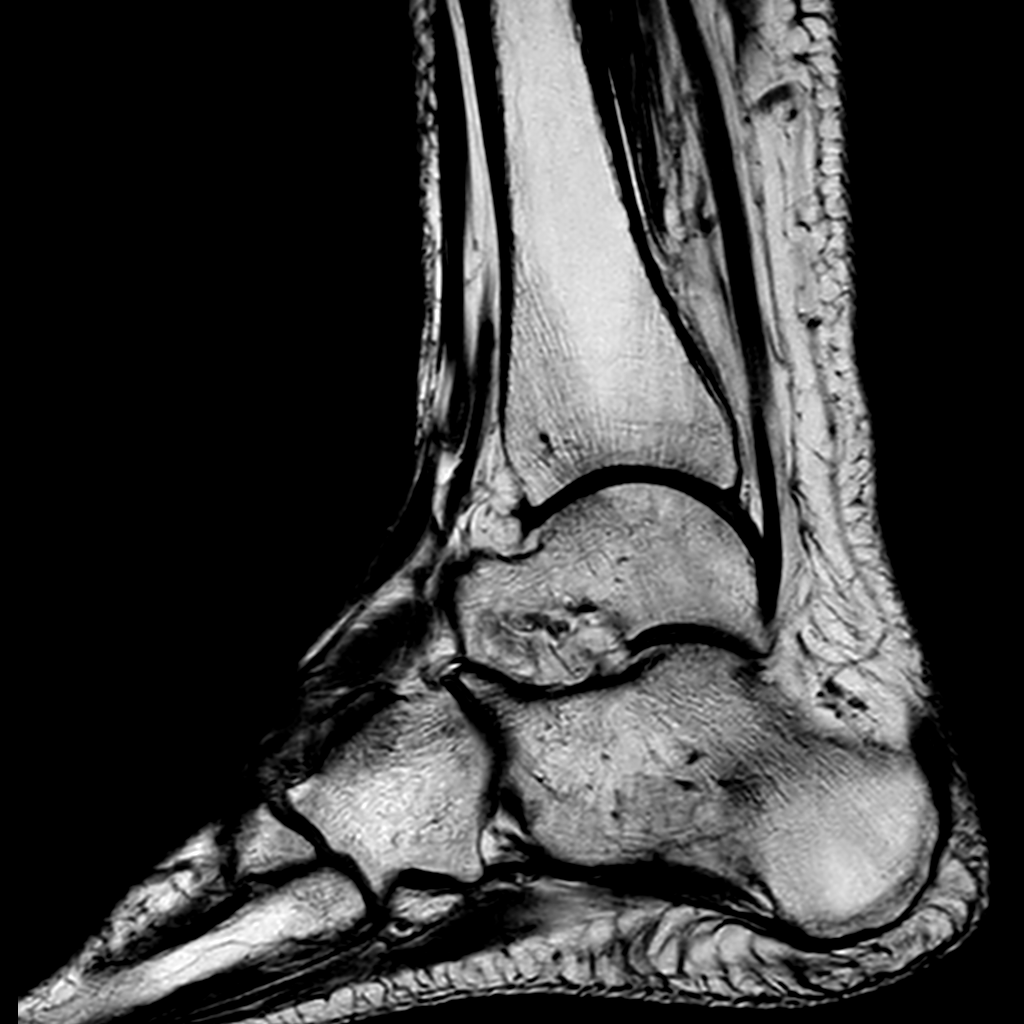

[10 of 40 positions shown; findings below may reference images not displayed]

FINDINGS: TENDONS: New mild enlargement posterior tibial tendon (PTT) and fluid in the 
tendon sheath, consistent with tendinosis and tenosynovitis. Overlying skin 
marker indicates this is at the site of symptoms. Small amount of fluid in the 
peroneal sheath, consistent with tenosynovitis. Equivocal findings for peroneus 
brevis split tear at the level of the lateral malleolus. The remaining flexor, 
extensor and peroneal tendons are intact. No tendon subluxation. The Achilles 
tendon is preserved. 
LIGAMENTS:  
LATERAL LIGAMENTS: The anterior talofibular ligament is intact. The 
calcaneofibular ligament and posterior talofibular ligament are preserved. 
SYNDESMOTIC LIGAMENTS: The anterior and posterior tibiofibular and interosseous 
ligaments are preserved. 
DELTOID LIGAMENTOUS COMPLEX: The deep and superficial components of the deltoid 
ligamentous complex are intact. 
SINUS TARSI LIGAMENTS: The cervical and interosseous ligaments are preserved. 
The inferior extensor retinaculum appears intact.  
RUFINALDO LIGAMENTOUS COMPLEX: Plantar calcaneonavicular ligament preserved. 
BONES AND JOINTS: Resolved 0.4 cm medial malleolar subcortical cyst and bone 
marrow edema. New 0.2 cm medial malleolar subcortical cyst. Small ankle joint 
effusion. No fracture, contusion or stress response. The talar dome is 
preserved. No focal osteochondral lesion. Articular cartilage is preserved. 
ADDITIONAL FINDINGS: Marked fatty infiltration of the flexor hallucis longus 
muscle. Sinus tarsi fat is preserved. Plantar fascia is intact. Tarsal tunnel is 
preserved. Mild subcutaneous soft tissue swelling.
IMPRESSION: 1.  New mild PTT tendinosis and tenosynovitis. 
2.  Resolved prior medial malleolar subcortical cyst/marrow edema and new 0.2 cm 
subcortical cyst.  
3.  Equivocal findings for peroneus brevis split tear at the level of the 
lateral malleolus and mild peroneal tenosynovitis.  
4.  Marked fatty infiltration of the flexor hallucis longus muscle.  
5.  Small ankle joint effusion,  
6.  Mild subcutaneous soft tissue swelling.

## 2023-05-27 ENCOUNTER — Other Ambulatory Visit: Payer: Self-pay | Admitting: Gastroenterology

## 2023-08-31 ENCOUNTER — Telehealth: Payer: Self-pay

## 2023-08-31 NOTE — Telephone Encounter (Signed)
 Outgoing t/c to [facility] to reschedule appt. No answer, lvm to call us back.

## 2024-01-09 DIAGNOSIS — I10 Essential (primary) hypertension: Secondary | ICD-10-CM | POA: Insufficient documentation

## 2024-02-27 NOTE — Progress Notes (Signed)
 Review of Systems Constitutional: Negative.  Negative for chills, fever and weight loss. HENT: Negative.  Negative for sore throat.  Respiratory:  Positive for shortness of breath. Negative for cough.  Cardiovascular: Negative.  Negative for chest pain. Gastrointestinal: Negative.  Negative for abdominal pain, blood in stool, constipation, nausea and vomiting. Genitourinary: Negative.  Negative for hematuria. Musculoskeletal: Negative.  Negative for falls. Neurological:  Positive for headaches. Negative for dizziness. Endo/Heme/Allergies:  Bruises/bleeds easily. Psychiatric/Behavioral:  Negative for depression.  Last mammogram 2025Last colonoscopy: 2023[name]

## 2024-02-27 NOTE — Progress Notes (Signed)
 Date of Encounter: 9/29/2025[name]    DOB: [birth year]  68 y.o.) female  MR: [id number] Date of Surgery:                      [facility]2022[facility]                              [facility] m[facility], sentinel lymph node biopsyDiagnosis:                               Invasive ductal carcinomaStaging:                                   cT1 Nx M0 G1, ER positive, PR positive, HER2 negativeRecurrence score:                  Oncotype DX  --    21______________________________________________________________________ HPI: [name] is seen today for her annual breast appointment.She has been doing well.Adjuvant treatment: Whole breast radiation for 4 weeks.  Endocrine therapy with tamoxifen Arm: She does not report any swelling, cellulitis, or change in motionBreast: She does not report any new masses, tenderness, skin change, or drainage.  She will have occasional puffiness and itching of the area.Medication: has been off tamoxifen for a few months, she will discuss with oncology at her next appointment in December.Imaging: [facility] mammogram 02/13/2024: no mammographic evidence of breast malignancyHospitalizations/Surgeries: Eye surgeryFamily history updates: No new updatesReview of systems: reviewed and updated.Outpatient Medications Marked as Taking for the 02/27/24 encounter (Office Visit) with Shevlin, Lisa E, PA-C Medication Sig Dispense Refill . acetaminophen  500 MG Oral tablet Take 1 tablet by mouth every 6 (six) hours as needed for Pain. Do not exceed more than 3000 mg of acetaminophen  from all sources within a rolling 24 hour time period. 30 tablet 0 . albuterol  90 mcg/actuation Inhl inhaler every 6 (six) hours as needed.   SABRA amy-cell-lipas-malt-prt-lac-in (DIGESTIVE ENZYMES,MAL,LAC,INV,) 220 mg Oral Cap Take  by mouth daily.   SABRA azaTHIOprine 50 mg Oral tablet Take 1 tablet by  mouth daily.   . cetirizine 10 MG Oral tablet Take 1 tablet by mouth daily.   . cholecalciferol, vitamin D3, 25 mcg (1,000 unit) Oral capsule Take 5 capsules by mouth daily.   . cyanocobalamin, VITAMIN B12, 1000 MCG tablet Take 5 tablets by mouth daily.   . DULoxetine  30 MG Oral DR capsule Take 2 capsules by mouth daily.   . famotidine 40 MG Oral tablet daily as needed.   . fluticasone -salmeterol 100-50 mcg/dose Inhl diskus inhaler 2 (two) times daily. 250/100   . gabapentin  300 MG Oral tablet Take 1 tablet by mouth 3 (three) times daily.   . Lactobacillus acidophilus (PROBIOTIC) 10 billion cell Oral Cap Take  by mouth daily.   . levothyroxine  50 MCG Oral tablet Take 62.5 mcg by mouth daily.   . magnesium 200 mg Oral Tab Take  by mouth 2 (two) times daily.   . methylPREDNISolone 4 mg Oral tablet as needed.   . mometasone  (NASONEX ) 50 mcg/actuation nasal spray by Nasal route daily as needed.   . montelukast   10 mg Oral tablet Take 1 tablet by mouth at bedtime.   . multivitamin Oral per tablet Take  by mouth daily.   SABRA omeprazole 40 MG Oral capsule Take 1 capsule by mouth daily.   SABRA tiZANidine 4 MG Oral capsule Take 1 capsule by mouth 3 (three) times daily.   SABRA UNABLE TO FIND Take 10 mcg by mouth. T3 take daily in the morning   . UNABLE TO FIND Naltrexone 6mg  bid   . UNABLE TO FIND Medical Marijuana 5-10 drops tincture bedtime   Physical ExamVitals:  02/27/24 1346 BP: (!) 156/90 Pulse: 66 Temp: 36.3 C (97.3 F) Weight: 63.5 kg (140 lb) Height: 5' 4 (1.626 m) PainSc: 0-No pain Body mass index is 24.03 kg/m.Patient is awake, alert and in no acute distressShe was examined in the sitting and lying positions.Breasts: Breasts are roughly symmetric.Left breast with mild edema.  Well-healed curvilinear scar in the upper outer quadrant.  No palpable breast masses.  Left axillary scar without masses or lymphadenopathy.  Patient with full range of motion  without lymphedema.Right breast without palpable masses.  Right axilla without masses or lymphadenopathy.Nodes: No cervical, supraclavicular, infraclavicular, or axillary adenopathyLungs: Clear to auscultation bilaterally, breathing easy on room airCardiac: Regular heart rhythm, normal S1/S2Abdomen: Soft, nondistended, nontender to palpationUpper extremities: No evidence of lymphedema, good active range of motion of bilateral upper extremitiesScreening / Education <redacted file path>  Breast Cancer Screening [x]  Up to date and reviewed            []  Mammogram []  Ultrasound            []  MRI           []  Imaging not indicated  Lymphedema Prevention [x]  Guidelines reviewed                   []  N/A Osteoporosis Prevention [x]  Guidelines reviewed                   []  N/A  Assessment:History of Left Breast CancerNo left upper extremity lymphedemaPlan: Monthly self examination recommendedImaging: [facility]up with oncology: as scheduledFollow-up in the office in 1 year with Dr. Moira minutes included face to face time and non-face to face time on the date of service by the provider. Time does not include separately billable services.Regina Gianotti PA-CGenesee Surgical Associates

## 2024-02-27 NOTE — Patient Instructions (Signed)
 Vitamin E supplement 400 or 800 international units 1 tablet daily. To help minimize radiation scarring.

## 2024-04-01 ENCOUNTER — Inpatient Hospital Stay
Admission: EM | Admit: 2024-04-01 | Discharge: 2024-04-03 | DRG: 282 | Disposition: A | Discharge status: Home / Self Care | Source: Ambulatory Visit | Attending: Internal Medicine | Admitting: Internal Medicine

## 2024-04-01 ENCOUNTER — Emergency Department

## 2024-04-01 ENCOUNTER — Other Ambulatory Visit: Payer: Self-pay

## 2024-04-01 DIAGNOSIS — R059 Cough, unspecified: Secondary | ICD-10-CM

## 2024-04-01 DIAGNOSIS — G7249 Other inflammatory and immune myopathies, not elsewhere classified: Secondary | ICD-10-CM | POA: Diagnosis present

## 2024-04-01 DIAGNOSIS — F32A Depression, unspecified: Secondary | ICD-10-CM | POA: Diagnosis present

## 2024-04-01 DIAGNOSIS — Z923 Personal history of irradiation: Secondary | ICD-10-CM

## 2024-04-01 DIAGNOSIS — I10 Essential (primary) hypertension: Secondary | ICD-10-CM

## 2024-04-01 DIAGNOSIS — I514 Myocarditis, unspecified: Secondary | ICD-10-CM | POA: Diagnosis present

## 2024-04-01 DIAGNOSIS — E559 Vitamin D deficiency, unspecified: Secondary | ICD-10-CM | POA: Insufficient documentation

## 2024-04-01 DIAGNOSIS — G7100 Muscular dystrophy, unspecified: Secondary | ICD-10-CM | POA: Diagnosis present

## 2024-04-01 DIAGNOSIS — R918 Other nonspecific abnormal finding of lung field: Secondary | ICD-10-CM

## 2024-04-01 DIAGNOSIS — E039 Hypothyroidism, unspecified: Secondary | ICD-10-CM | POA: Diagnosis present

## 2024-04-01 DIAGNOSIS — Z20822 Contact with and (suspected) exposure to covid-19: Secondary | ICD-10-CM | POA: Diagnosis present

## 2024-04-01 DIAGNOSIS — G8929 Other chronic pain: Secondary | ICD-10-CM | POA: Diagnosis present

## 2024-04-01 DIAGNOSIS — Z87891 Personal history of nicotine dependence: Secondary | ICD-10-CM

## 2024-04-01 DIAGNOSIS — Z853 Personal history of malignant neoplasm of breast: Secondary | ICD-10-CM

## 2024-04-01 DIAGNOSIS — K219 Gastro-esophageal reflux disease without esophagitis: Secondary | ICD-10-CM | POA: Diagnosis present

## 2024-04-01 DIAGNOSIS — G609 Hereditary and idiopathic neuropathy, unspecified: Secondary | ICD-10-CM | POA: Diagnosis present

## 2024-04-01 DIAGNOSIS — I440 Atrioventricular block, first degree: Secondary | ICD-10-CM

## 2024-04-01 DIAGNOSIS — G43909 Migraine, unspecified, not intractable, without status migrainosus: Secondary | ICD-10-CM | POA: Diagnosis present

## 2024-04-01 DIAGNOSIS — I214 Non-ST elevation (NSTEMI) myocardial infarction: Principal | ICD-10-CM | POA: Diagnosis present

## 2024-04-01 DIAGNOSIS — K589 Irritable bowel syndrome without diarrhea: Secondary | ICD-10-CM | POA: Diagnosis present

## 2024-04-01 DIAGNOSIS — R079 Chest pain, unspecified: Secondary | ICD-10-CM | POA: Diagnosis present

## 2024-04-01 DIAGNOSIS — D7589 Other specified diseases of blood and blood-forming organs: Secondary | ICD-10-CM | POA: Diagnosis present

## 2024-04-01 DIAGNOSIS — M797 Fibromyalgia: Secondary | ICD-10-CM | POA: Diagnosis present

## 2024-04-01 DIAGNOSIS — Z9012 Acquired absence of left breast and nipple: Secondary | ICD-10-CM

## 2024-04-01 DIAGNOSIS — R0602 Shortness of breath: Secondary | ICD-10-CM

## 2024-04-01 DIAGNOSIS — D849 Immunodeficiency, unspecified: Secondary | ICD-10-CM | POA: Diagnosis present

## 2024-04-01 DIAGNOSIS — R7401 Elevation of levels of liver transaminase levels: Secondary | ICD-10-CM | POA: Diagnosis present

## 2024-04-01 DIAGNOSIS — J069 Acute upper respiratory infection, unspecified: Secondary | ICD-10-CM | POA: Diagnosis present

## 2024-04-01 DIAGNOSIS — Z789 Other specified health status: Secondary | ICD-10-CM

## 2024-04-01 DIAGNOSIS — J45909 Unspecified asthma, uncomplicated: Secondary | ICD-10-CM | POA: Diagnosis present

## 2024-04-01 LAB — LIPID PANEL
Chol/HDL Ratio: 2.5 (ref 0.0–4.4)
Cholesterol: 193 mg/dL
HDL: 78 mg/dL — ABNORMAL HIGH (ref 40–60)
LDL Calculated: 106 mg/dL
Non HDL Cholesterol: 115 mg/dL
Triglycerides: 46 mg/dL

## 2024-04-01 LAB — BASIC METABOLIC PANEL
Anion Gap: 10 (ref 7–16)
CO2: 26 mmol/L (ref 20–28)
Calcium: 9.7 mg/dL (ref 8.6–10.2)
Chloride: 102 mmol/L (ref 96–108)
Creatinine: 0.54 mg/dL (ref 0.51–0.95)
Glucose: 97 mg/dL (ref 60–99)
Lab: 13 mg/dL (ref 6–20)
Potassium: 4.5 mmol/L (ref 3.3–5.1)
Sodium: 138 mmol/L (ref 133–145)
eGFR BY CREAT: 100

## 2024-04-01 LAB — RUQ PANEL (ED ONLY)
ALT: 85 U/L — ABNORMAL HIGH (ref 0–35)
AST: 80 U/L — ABNORMAL HIGH (ref 0–35)
Albumin: 4.1 g/dL (ref 3.5–5.2)
Alk Phos: 74 U/L (ref 35–105)
Amylase: 51 U/L (ref 28–100)
Bili,Indirect: 0.2 mg/dL (ref 0.1–1.0)
Bilirubin,Direct: 0.2 mg/dL (ref 0.0–0.3)
Bilirubin,Total: 0.4 mg/dL (ref 0.0–1.2)
Lipase: 24 U/L (ref 13–60)
Total Protein: 6.3 g/dL (ref 6.3–7.7)

## 2024-04-01 LAB — CBC AND DIFFERENTIAL
Baso # K/uL: 0.1 THOU/uL (ref 0.0–0.2)
Eos # K/uL: 0.4 THOU/uL (ref 0.0–0.5)
Hematocrit: 44 % (ref 34–49)
Hemoglobin: 14.2 g/dL (ref 11.2–16.0)
IMM Granulocytes #: 0 THOU/uL (ref 0–0)
IMM Granulocytes: 0.2 %
Lymph # K/uL: 1.2 THOU/uL (ref 1.0–5.0)
MCV: 103 fL — ABNORMAL HIGH (ref 75–100)
Mono # K/uL: 0.5 THOU/uL (ref 0.1–1.0)
Neut # K/uL: 2.7 THOU/uL (ref 1.5–6.5)
Nucl RBC # K/uL: 0 THOU/uL
Nucl RBC %: 0 /100{WBCs} (ref 0.0–0.2)
Platelets: 239 THOU/uL (ref 150–450)
RBC: 4.2 MIL/uL (ref 4.0–5.5)
RDW: 15.7 % — ABNORMAL HIGH (ref 0.0–15.0)
Seg Neut %: 56.2 %
WBC: 4.8 THOU/uL (ref 3.5–11.0)

## 2024-04-01 LAB — META/RHINO/ADENO VIRUS NAAT (PCR)
Adenovirus NAAT (PCR): NEGATIVE
Metapneumovirus NAAT (PCR): NEGATIVE
Rhinovirus NAAT (PCR): NEGATIVE

## 2024-04-01 LAB — UNFRAC HEPARIN, ANTI XA
UNFRAC HEPARIN,ANTI XA: <0.04 — ABNORMAL LOW (ref 0.30–0.70)
UNFRAC HEPARIN,ANTI XA: 0.63 (ref 0.30–0.70)

## 2024-04-01 LAB — VITAMIN B12: Vitamin B12: 760 pg/mL (ref 232–1245)

## 2024-04-01 LAB — COVID/INFLUENZA A & B/RSV NAAT (PCR)
COVID-19 NAAT (PCR): NEGATIVE
Influenza A NAAT (PCR): NEGATIVE
Influenza B NAAT (PCR): NEGATIVE
RSV NAAT (PCR): NEGATIVE

## 2024-04-01 LAB — TROPONIN T 1 HR W/ DELTA HIGH SENSITIVITY

## 2024-04-01 LAB — APTT: aPTT: 41.7 s — ABNORMAL HIGH (ref 25.8–37.9)

## 2024-04-01 LAB — TSH: TSH: 2.47 u[IU]/mL (ref 0.27–4.20)

## 2024-04-01 LAB — NT-PRO BNP: NT-pro BNP: 67 pg/mL (ref 0–900)

## 2024-04-01 LAB — PARAINFLUENZA 1-4 NAAT (PCR)
Parainfluenza virus 1 NAAT (PCR): NEGATIVE
Parainfluenza virus 2 NAAT (PCR): NEGATIVE
Parainfluenza virus 3 NAAT (PCR): NEGATIVE
Parainfluenza virus 4 NAAT (PCR): NEGATIVE

## 2024-04-01 LAB — TROPONIN T RANDOM HIGH SENSITIVITY: TROP T RANDOM High Sensitivity: 84 ng/L (ref 0–14)

## 2024-04-01 LAB — TROPONIN T 0 HR HIGH SENSITIVITY (IP/ED ONLY): TROP T 0 HR High Sensitivity: 92 ng/L (ref 0–11)

## 2024-04-01 LAB — TROPONIN T 3 HR W/ DELTA HIGH SENSITIVITY (IP/ED ONLY)
TROP T 0-3 HR DELTA High Sensitivity: 0 (ref 0–4)
TROP T 3 HR High Sensitivity: 92 ng/L (ref 0–14)

## 2024-04-01 LAB — CK: CK: 448 U/L — ABNORMAL HIGH (ref 26–192)

## 2024-04-01 LAB — FOLATE: Folate: <4 ng/mL — AB (ref >=4.6)

## 2024-04-01 LAB — MAGNESIUM: Magnesium: 2.2 mg/dL (ref 1.6–2.5)

## 2024-04-01 MED ORDER — CYCLOSPORINE 0.05 % OP EMUL *I*
1.0000 [drp] | Freq: Two times a day (BID) | OPHTHALMIC | Status: DC
Start: 2024-04-01 — End: 2024-05-31
  Administered 2024-04-01 – 2024-04-03 (×2): 1 [drp] via OPHTHALMIC
  Filled 2024-04-01: qty 1
  Filled 2024-04-01: qty 0.13
  Filled 2024-04-01 (×4): qty 1

## 2024-04-01 MED ORDER — PANTOPRAZOLE SODIUM 40 MG PO TBEC *I*
40.0000 mg | DELAYED_RELEASE_TABLET | Freq: Two times a day (BID) | ORAL | Status: DC
Start: 2024-04-01 — End: 2024-05-01
  Administered 2024-04-01 – 2024-04-03 (×3): 40 mg via ORAL
  Filled 2024-04-01 (×3): qty 1

## 2024-04-01 MED ORDER — LIOTHYRONINE SODIUM 5 MCG PO TABS *I*
15.0000 ug | ORAL_TABLET | Freq: Every day | ORAL | Status: DC
Start: 2024-04-02 — End: 2024-05-02
  Administered 2024-04-02 – 2024-04-03 (×2): 15 ug via ORAL
  Filled 2024-04-01 (×3): qty 3

## 2024-04-01 MED ORDER — SENNOSIDES 8.6 MG PO TABS *I*
2.0000 | ORAL_TABLET | Freq: Every evening | ORAL | Status: DC
Start: 2024-04-01 — End: 2024-05-31
  Administered 2024-04-01: 2 via ORAL
  Filled 2024-04-01 (×2): qty 2

## 2024-04-01 MED ORDER — ASPIRIN 81 MG PO CHEW *I*
81.0000 mg | CHEWABLE_TABLET | Freq: Every day | ORAL | Status: DC
Start: 2024-04-02 — End: 2024-06-01
  Administered 2024-04-02 – 2024-04-03 (×2): 81 mg via ORAL
  Filled 2024-04-01 (×2): qty 1

## 2024-04-01 MED ORDER — GABAPENTIN 300 MG PO CAPSULE *I*
300.0000 mg | ORAL_CAPSULE | Freq: Four times a day (QID) | ORAL | Status: DC
Start: 2024-04-01 — End: 2024-05-31
  Administered 2024-04-01 – 2024-04-03 (×7): 300 mg via ORAL
  Filled 2024-04-01 (×7): qty 1

## 2024-04-01 MED ORDER — POLYETHYLENE GLYCOL 3350 PO PACK 17 GM *I*
17.0000 g | PACK | Freq: Every day | ORAL | Status: DC | PRN
Start: 2024-04-01 — End: 2024-05-31

## 2024-04-01 MED ORDER — SODIUM CHLORIDE 0.9 % IV BOLUS *I*
1000.0000 mL | Freq: Once | Status: AC
Start: 2024-04-01 — End: 2024-04-01
  Administered 2024-04-01: 1000 mL via INTRAVENOUS

## 2024-04-01 MED ORDER — ASPIRIN 81 MG PO CHEW *I*
81.0000 mg | CHEWABLE_TABLET | Freq: Every day | ORAL | Status: DC
Start: 2024-04-01 — End: 2024-04-01

## 2024-04-01 MED ORDER — CETIRIZINE HCL 10 MG PO TABS *I*
10.0000 mg | ORAL_TABLET | Freq: Every day | ORAL | Status: DC
Start: 2024-04-01 — End: 2024-05-31
  Administered 2024-04-01 – 2024-04-03 (×3): 10 mg via ORAL
  Filled 2024-04-01 (×3): qty 1

## 2024-04-01 MED ORDER — ACETAMINOPHEN 500 MG PO TABS *I*
1000.0000 mg | ORAL_TABLET | Freq: Three times a day (TID) | ORAL | Status: DC | PRN
Start: 2024-04-01 — End: 2024-05-31
  Administered 2024-04-01 – 2024-04-02 (×3): 1000 mg via ORAL
  Filled 2024-04-01 (×3): qty 2

## 2024-04-01 MED ORDER — HEPARIN INFUSION 100 UNITS/ML (BOLUS FROM BAG) *I*
60.0000 [IU]/kg | Freq: Once | INTRAVENOUS | Status: AC
Start: 2024-04-01 — End: 2024-04-01
  Administered 2024-04-01: 3948 [IU] via INTRAVENOUS

## 2024-04-01 MED ORDER — FOLIC ACID 1 MG PO TABS *I*
1.0000 mg | ORAL_TABLET | Freq: Every day | ORAL | Status: DC
Start: 2024-04-01 — End: 2024-05-31
  Administered 2024-04-01 – 2024-04-03 (×3): 1 mg via ORAL
  Filled 2024-04-01 (×3): qty 1

## 2024-04-01 MED ORDER — HEPARIN INFUSION 100 UNITS/ML BOLUS FROM BAG - MODERATE INTENSITY ANTI-XA CALCULATOR
0.0000 [IU] | INTRAVENOUS | Status: DC | PRN
Start: 2024-04-01 — End: 2024-05-31

## 2024-04-01 MED ORDER — FAMOTIDINE (PF) 20 MG/2ML IV SOLN *I*
20.0000 mg | Freq: Once | INTRAVENOUS | Status: AC
Start: 2024-04-01 — End: 2024-04-01
  Administered 2024-04-01: 20 mg via INTRAVENOUS
  Filled 2024-04-01: qty 2

## 2024-04-01 MED ORDER — IOHEXOL 350 MG/ML (OMNIPAQUE) IV SOLN *I*
1.0000 mL | Freq: Once | INTRAVENOUS | Status: AC
Start: 2024-04-01 — End: 2024-04-01
  Administered 2024-04-01: 70 mL via INTRAVENOUS

## 2024-04-01 MED ORDER — BUDESONIDE 0.5 MG/2ML IN SUSP *I*
0.2500 mg | Freq: Two times a day (BID) | RESPIRATORY_TRACT | Status: DC
Start: 2024-04-01 — End: 2024-05-31
  Administered 2024-04-01 – 2024-04-03 (×4): 0.25 mg via RESPIRATORY_TRACT
  Filled 2024-04-01 (×6): qty 2

## 2024-04-01 MED ORDER — LEVOTHYROXINE SODIUM 25 MCG PO TABS *I*
50.0000 ug | ORAL_TABLET | Freq: Every day | ORAL | Status: DC
Start: 2024-04-01 — End: 2024-05-31
  Administered 2024-04-01 – 2024-04-03 (×3): 50 ug via ORAL
  Filled 2024-04-01 (×2): qty 2
  Filled 2024-04-01: qty 1

## 2024-04-01 MED ORDER — AZATHIOPRINE 50 MG PO TABS *I*
50.0000 mg | ORAL_TABLET | Freq: Three times a day (TID) | ORAL | Status: DC
Start: 2024-04-01 — End: 2024-05-31
  Administered 2024-04-01 – 2024-04-03 (×6): 50 mg via ORAL
  Filled 2024-04-01 (×8): qty 1

## 2024-04-01 MED ORDER — DULOXETINE HCL 30 MG PO CPEP *I*
60.0000 mg | DELAYED_RELEASE_CAPSULE | Freq: Every day | ORAL | Status: DC
Start: 2024-04-01 — End: 2024-05-31
  Administered 2024-04-01 – 2024-04-03 (×3): 60 mg via ORAL
  Filled 2024-04-01 (×3): qty 2

## 2024-04-01 MED ORDER — MONTELUKAST SODIUM 10 MG PO TABS *I*
10.0000 mg | ORAL_TABLET | Freq: Every day | ORAL | Status: DC
Start: 2024-04-01 — End: 2024-05-31
  Administered 2024-04-02: 10 mg via ORAL
  Filled 2024-04-01 (×3): qty 1

## 2024-04-01 MED ORDER — CHOLECALCIFEROL 1000 UNIT PO TABS *I*
1000.0000 [IU] | ORAL_TABLET | Freq: Every day | ORAL | Status: DC
Start: 2024-04-01 — End: 2024-05-31
  Administered 2024-04-01 – 2024-04-03 (×3): 1000 [IU] via ORAL
  Filled 2024-04-01 (×3): qty 1

## 2024-04-01 MED ORDER — MELATONIN 3 MG PO TABS *I*
6.0000 mg | ORAL_TABLET | Freq: Every evening | ORAL | Status: DC | PRN
Start: 2024-04-01 — End: 2024-05-31
  Administered 2024-04-01: 6 mg via ORAL
  Filled 2024-04-01: qty 2

## 2024-04-01 MED ORDER — DEXTROSE 5 % FLUSH FOR PUMPS *I*
0.0000 mL/h | INTRAVENOUS | Status: DC | PRN
Start: 2024-04-01 — End: 2024-05-31

## 2024-04-01 MED ORDER — VITAMIN B-12 1000 MCG PO TABS *I*
2000.0000 ug | ORAL_TABLET | Freq: Every day | ORAL | Status: DC
Start: 2024-04-01 — End: 2024-05-31
  Administered 2024-04-01 – 2024-04-03 (×3): 2000 ug via ORAL
  Filled 2024-04-01 (×3): qty 2

## 2024-04-01 MED ORDER — TIZANIDINE HCL 4 MG PO TABS *I*
2.0000 mg | ORAL_TABLET | Freq: Three times a day (TID) | ORAL | Status: DC
Start: 2024-04-01 — End: 2024-05-31
  Administered 2024-04-01 – 2024-04-03 (×5): 2 mg via ORAL
  Filled 2024-04-01 (×5): qty 1

## 2024-04-01 MED ORDER — ENOXAPARIN SODIUM 40 MG/0.4ML IJ SOSY *I*
40.0000 mg | PREFILLED_SYRINGE | Freq: Every day | INTRAMUSCULAR | Status: DC
Start: 2024-04-01 — End: 2024-04-01

## 2024-04-01 MED ORDER — ASPIRIN 81 MG PO CHEW *I*
324.0000 mg | CHEWABLE_TABLET | Freq: Once | ORAL | Status: AC
Start: 2024-04-01 — End: 2024-04-01
  Administered 2024-04-01: 324 mg via ORAL
  Filled 2024-04-01: qty 4

## 2024-04-01 MED ORDER — NITROGLYCERIN 0.4 MG SL SUBL *I*
0.4000 mg | SUBLINGUAL_TABLET | SUBLINGUAL | Status: AC | PRN
Start: 2024-04-01 — End: 2024-04-01

## 2024-04-01 MED ORDER — HEPARIN INFUSION 100 UNITS/ML - *WRAPPED* FOR CALCULATORS *I*
0.0000 [IU]/kg/h | INTRAVENOUS | Status: DC
Start: 2024-04-01 — End: 2024-04-03
  Administered 2024-04-01 – 2024-04-02 (×12): 12 [IU]/kg/h via INTRAVENOUS
  Filled 2024-04-01: qty 250

## 2024-04-01 MED ORDER — ARFORMOTEROL TARTRATE 15 MCG/2ML IN NEBU *I*
15.0000 ug | INHALATION_SOLUTION | Freq: Two times a day (BID) | RESPIRATORY_TRACT | Status: DC
Start: 2024-04-01 — End: 2024-05-31
  Administered 2024-04-01 – 2024-04-03 (×4): 15 ug via RESPIRATORY_TRACT
  Filled 2024-04-01 (×5): qty 2

## 2024-04-01 MED ORDER — CARVEDILOL 3.125 MG PO TABS *I*
3.1250 mg | ORAL_TABLET | Freq: Once | ORAL | Status: AC
Start: 2024-04-01 — End: 2024-04-01
  Administered 2024-04-01: 3.125 mg via ORAL
  Filled 2024-04-01: qty 1

## 2024-04-01 MED ORDER — SODIUM CHLORIDE 0.9 % FLUSH FOR PUMPS *I*
0.0000 mL/h | INTRAVENOUS | Status: DC | PRN
Start: 2024-04-01 — End: 2024-05-31

## 2024-04-01 MED ORDER — CARVEDILOL 3.125 MG PO TABS *I*
3.1250 mg | ORAL_TABLET | Freq: Two times a day (BID) | ORAL | Status: DC
Start: 2024-04-01 — End: 2024-04-15
  Administered 2024-04-01 – 2024-04-03 (×3): 3.125 mg via ORAL
  Filled 2024-04-01 (×4): qty 1

## 2024-04-01 NOTE — ED Notes (Signed)
 Report Given ToWest 5, RNDescriptive Sentence / Reason for Admission Presenting Symptoms: Pt to the ED from home via EMS. Per EMS Pt states that she has has flu-like symptoms since Wednesday. States that husband at home tested positive for Covid, states her test was negative. Pt states that she was unable to sleep tonight due to mid sternal chest pain, pain does not radiate. Pt endorsing congestion, sore throat, nausea, and body aches. Per EMS Pt hypotensive in the 90's en route to hospital. Admitting Diagnosis: NSTEMI (non-ST elevated myocardial infarction) Plan of Care: Admit for echo, monitorActive Issues / Relevant Events Notable Events while in ED (rapids, falls, abnormal VS etc.): Pertinent Lab results: see resultsPertinent Imaging results: see resultsCode Status: FullOrientation Status: A&Ox4Current Ambulation Status: supervision/ standbySkin Concerns/Wounds/Specialty Bed Needs: Precautions: Current O2 Needs? What is their baseline?: Any Meds or valuables with patient: Mode of Transport to IP (pt is in stretcher/wheelchair/IP bed):  To Do ListOutstanding Tasks/Tests:  Anticipatory Guidance / Discharge PlanningPatient's living situation/needs for discharge:

## 2024-04-01 NOTE — H&P (Signed)
 Hospital Medicine Admission NoteChief Complaint: chest painFull CodeSUBJECTIVE: HPI: 68 y.o. 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 She describes the chest pain as sharp, constant, and radiating to her back and jaw. She felt mildly short of breath at the time, no diaphoresis. Denies positional changes relieving the pain. Her pain is currently resolved. Denies chest pain with exertion. Denies family hx of heart disease. Says she was a social smoker several years ago but never consistently used tobacco. She had an episode of chest pain/back pain before in July, where she went to the ED and ruled out ACS. She also reports since Wednesday, she has been feeling ill with cough, congestion, body ache and headache. Her husband tested positive for COVID this past week, but she herself tested negative. She feels these symptoms are getting better. She currently lives in [place]  and [place] for the last two months. Her PCP is in [place] : Eleanor Lawrence, NP. She reports she was diagnosed with muscular dystrophy through genetic testing and has been following with a rheumatologist in [facility] for possible small fiber neuropathy vs inflammatory myopathy.  In the ED, BP soft, given 1L bolus of fluids. Flu/Covid/Rsv negative. Labs revealing mild macrocytosis (103), low folate, elevated CK (448)mildly elevated liver enzymes (AST 80, ALT 85). ACS workup revealing elevated troponins (92,84, 92), no ischemic changes on EKG. Possible NSTEMI, so cardiology was consulted. Given aspirin 324mg , started carvedilol 3.125 twice daily, and nitro as needed for chest pain.  CT angio chest negative for PE. CXR no acute findings. Family Hx: No family hx of heart disease.  Social Hx: Drinks glass a wine per day. Social smoking in the past in college.  Allergies: Reviewed. Medications: Reviewed and accounted for in electronic medication list as well as plan as appropriate. Code Status: FullObjective OBJECTIVE: BP: (94-135)/(60-78) Temp:  [35.9 C (96.6 F)-36.5 C (97.7 F)] Temp src: Temporal (11/02 0945)Heart Rate:  [60-70] Resp:  [17-20] SpO2:  [95 %-100 %] Height:  [165.1 cm (5' 5)] Weight:  [65.8 kg (145 lb)] Physical Exam:Constitutional: NADEyes:  normal EOMs, conjunctiva normal, no scleral icterusEars, Nose, Mouth and Throat: moist mucous membranesCardiovascular: RRR, no murmurs, rubs or gallops; no lower extremity edema Respiratory: lungs CTA bilaterally; no wheezing, rales or rhonchi Gastrointestinal: soft, non-distended, mild diffuse tendernessMusculoskeletal: ambulates with rolling walkerSkin: warm and dryNeurological: A&Ox3; no focal deficitPsychiatric: normal affectLab, Microbiology, Cardiac & Imaging Studies:Personally reviewedNo intake or output data in the 24 hours ending 04/01/24 1000 ASSESSMENT: [name] is a  68 y.o. 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 Elevated adynamic troponins. PLAN: #Elevated Troponins#Chest pain #Possible NSTEMI/UA, Troponins elevated but adynamic (07,15,07),  patient is currently stable and without chest pain. EKG NSR without ischemic changes. Possible viral illness with infectious symptoms. CT angio chest negative for PE.  S/p aspirin load 324mg  - Continuous telemetry- Obtain A1C, TSH, lipid panel- Daily CBC/BMP/Mag - maintain K >4 and Mg >2- Continue coreg 3.125mg  BID- Sublingual nitroglycerin 0.4mg  PRN for chest pain, can repeat q70min x3 doses- Start heparin drip for  ACS - Aspirin 81 mg daily- If symptoms recur, please obtain EKG acutely- TTE prior to discharge ordered-Cardiology following, appreciate recs. -NPO after midnight for possible angiogram #Cough, body aches-likely viral illness, symptoms improving-COVID/FLU/RSV neg-added extended resp panel due to patient being immunocompromised-CXR no acute findings#HTN- hold home lisinopril 2.5mg #Transaminitis- Mild elevation in liver enzymes (AST 80, ALT 85), possibly in setting of viral illness. - Continue to monitor#Macrocytosis- continue to monitor- Folate <4-Vitamin B12 normal-Start folate supplementChronic pain: -cont Gabapentin  300mg  QID, Duloxetine  60 mg daily, decrease home tizanidine to 2mg  TID Chronic problems: Possible small fiber neuropathy vs inflammatory myopathy: Azathioprine 50 mg TIDHypothyroidism: Levothyroxine  50 mcg daily, T3 15 mcg dailyAsthma/Allergies: Fluticasone -salmeterol, Montelukast  10 mg daily, zyrtecGERD: Omeprazole 20 mg BIDMed Rec Completed: yesMedications: DVT:  enoxaparin (LOVENOX) injection 40 mg/0.4 mLBowel: polyethylene glycol (MIRALAX) 3350 17gm PO,senna (SENOKOT) 8.6 mg tablet (last BM  None)Pain: tylenol  PRN Maintenance: Lines/Tubes: pIV  Fluids: PO Diet: Diet regular Disposition: PT/OT/SLP: Discharge to: TBDBarriers to Discharge: pending clinical courseMRDD: 04/04/2024   Signed by Asberry Breaker, MD PGY1As of 04/01/2024  at 10:00 AMMeds & Labs: Medications Scheduled Meds: senna  2 tablet Oral Nightly  enoxaparin  40 mg Subcutaneous Daily @ 2100  cyanocobalamin  2,000 mcg Oral Daily  cholecalciferol  1,000  units Oral Daily  levothyroxine   50 mcg Oral Daily @ 0600  DULoxetine   60 mg Oral Daily  arformoterol  15 mcg Nebulization 2 times per day  And  budesonide  0.25 mg Nebulization 2 times per day  gabapentin   300 mg Oral 4x Daily  montelukast   10 mg Oral Daily  pantoprazole  40 mg Oral BID AC  tiZANidine  2 mg Oral TID  [START ON 04/02/2024] liothyronine   15 mcg Oral Daily  carvedilol  3.125 mg Oral 2 times per day  azaTHIOprine  50 mg Oral TID  cycloSPORINE  1 drop Both Eyes 2 times per day Continuous Infusions:PRN Meds:.sodium chloride, dextrose, nitroglycerin, acetaminophen , polyethylene glycol, melatoninLab Frequency Next Occurrence EKG: follow up PRN

## 2024-04-01 NOTE — ED Triage Notes (Addendum)
 Pt to the ED from home via EMS. Per EMS Pt states that she has has flu-like symptoms since Wednesday. States that husband at home tested positive for Covid, states her test was negative. Pt states that she was unable to sleep tonight due to mid sternal chest pain, pain does not radiate. Pt endorsing congestion, sore throat, nausea, and body aches. Per EMS Pt hypotensive in the 90's en route to hospital.  Blood Glucose Meter (mg/dl): 866PC Solution: Normal saline (100 mL)Prehospital medications given: No

## 2024-04-01 NOTE — Progress Notes (Signed)
 Patient had COVID exposure on 04/01/24 resulting in need for COVID Quarantine.  Please obtain swabs on the following days: 11/5 and 11/9 If all are negative, quarantine can be removed on 11/9.Floor, admitting and provider notified. Jarrod Mcenery ELAINE Jasmon Graffam, PAInfection Prevention

## 2024-04-01 NOTE — Provider Consult (Addendum)
 [facility] Cardiology Consult NoteDate of Consult: 11/2/2025Name: [name] Attending: Josue Shermon Ned, MD DOB: [birth year] PCP: Unknown, Provider, MD EMRN: [id number] Primary Cardiologist: None  Admit Date:  04/01/2024 History of Present Illness I had the pleasure of seeing [name] in cardiology consult on 04/01/2024. [name] is an 68 y.o. female who we were asked to see for chest pain, shortness of breath.  Her past medical history is significant for depression, GERD, IBS, migraines, breast cancer, muscular dystrophy.  She has been feeling poorly the past few days, noting cough, congestion, body aches, headache.  Then around 3 AM this morning, she woke feeling sharp chest pain and shortness of breath.  Symptoms lasted about 30 minutes.  The pain initially radiated to her back.  Of note, her husband recently tested positive for COVID.  Her test came back negative.  She traveled here from [place]  2 months ago.  No recent swelling.  No palpitations.  No other cardiac concerns.Due to her muscular dystrophy, she is not very active at baseline.  She gets around with a walker.  She does do chair exercises on a regular basis and has not had any difficulty with cardiac symptoms doing her chair exercises recently.Upon arrival here, her first troponin was 92, second 3-hour troponin was 84.  CT was negative for acute PE.  She is currently without chest pain symptoms.  No other cardiac concerns.Past Medical and Surgical History Past Medical History: Diagnosis Date  Acne   Actinic keratosis   Allergy history unknown   Anemia   Asthma 05/10/2019  Back pain   Resolved  Complication of anesthesia   Novacaine Dizzy  Depression   Dermatitis 02/09/2012  Dermatitis   Diverticulosis   Essential hypertension 01/09/2024  GERD (gastroesophageal reflux disease)   Gout   Herpes   Genital  IBS (irritable bowel syndrome)    Inflammatory arthritis 03/07/2017  Lumbar stenosis   Migraine   Neuropathy   Pulmonary fibrosis   Recurrent sinus infections   STD (sexually transmitted disease)   Gential Herpes  Urticaria   Varicella  Past Surgical History: Procedure Laterality Date  ANKLE SURGERY Left 11/10/2022  cyst removed from behind a bone  CESAREAN SECTION, CLASSIC  06/01/1983  ENDOMETRIAL ABLATION  06/01/2007  INCONTINENCE SURGERY  05/31/2008  NASAL POLYP SURGERY  05/31/2001  RECTOCELE REPAIR  05/31/2008  SPHINCTEROPLASTY  05/31/2008  ABSCESS AFTER SURGERY Medications and Allergies Current Outpatient Medications Medication Instructions  cetirizine (ZYRTEC) 10 mg, DAILY  Cholecalciferol (VITAMIN D3 PO) Take by mouth  Drops 5,000 units po daily  Cyanocobalamin (VITAMIN B-12) 2000 MCG TBCR No dose, route, or frequency recorded.  DIGESTIVE ENZYMES PO 1 capsule, DAILY  DULoxetine  (CYMBALTA ) 60 mg, DAILY  Estriol POWD Estriol 3 mg + DHEA 2.5 mg/ml versabase cream    Apply 1 ml to vulva or place 1 ml into the vagina twice weekly Disp: 30 ml  fluticasone -salmeterol (ADVAIR/WIXELA) 100-50 mcg/dose diskus inhaler Advair Diskus 100 mcg-50 mcg/dose powder for inhalation  gabapentin  (NEURONTIN ) 900 mg, Oral, NIGHTLY  levothyroxine  (SYNTHROID , LEVOTHROID) 50 mcg tablet TAKE 1 TABLET BY MOUTH DAILY (BEFORE BREAKFAST)  Liotrix 15 (3.1-12.5) MG (MCG) TABS t3 sr capsules TAKE ONE CAPSULE BY MOUTH EVERY MORNING  Magnesium 250 mg, Oral, DAILY  methylPREDNISolone (MEDROL) 4 MG tablet methylprednisolone 4 mg tablets in a dose pack  montelukast  (SINGULAIR ) 10 mg, Oral, DAILY  Multiple Vitamin (MULTI-VITAMIN DAILY PO) Take by mouth  mycophenolate (CELLCEPT) 1,000 mg, 2 TIMES DAILY  omeprazole (PRILOSEC) 40 mg,  DAILY  omeprazole (PRILOSEC) 20 mg, Oral, 2 TIMES DAILY  PROAIR  HFA 108 (90 BASE) MCG/ACT inhaler 2 puffs, EVERY 4-6 HOURS PRN  Probiotic Product (PROBIOTIC  FORMULA PO) 1 capsule, DAILY  tiZANidine (ZANAFLEX) 4 mg tablet No dose, route, or frequency recorded.  valACYclovir  (VALTREX ) 1 GM tablet TAKE 1 TABLET DAILY  WIXELA INHUB 250-50 MCG/ACT diskus inhaler 1 puff, 2 TIMES DAILY Current Facility-Administered Medications Medication Dose Route Frequency  senna  2 tablet Oral Nightly  enoxaparin  40 mg Subcutaneous Daily @ 2100  cyanocobalamin  2,000 mcg Oral Daily  cholecalciferol  1,000 units Oral Daily  levothyroxine   50 mcg Oral Daily @ 0600  DULoxetine   60 mg Oral Daily  arformoterol  15 mcg Nebulization 2 times per day  And  budesonide  0.25 mg Nebulization 2 times per day  gabapentin   300 mg Oral 4x Daily  montelukast   10 mg Oral Daily  pantoprazole  40 mg Oral BID AC  tiZANidine  2 mg Oral TID  [START ON 04/02/2024] liothyronine   15 mcg Oral Daily  carvedilol  3.125 mg Oral 2 times per day  azaTHIOprine  50 mg Oral TID  cycloSPORINE  1 drop Both Eyes 2 times per day  cetirizine  10 mg Oral Daily She is allergic to amoxicillin , demerol, environmental allergies, erythromycin, gluten meal, sulfa antibiotics, and plaquenil [hydroxychloroquine].Social and Family History Family History Problem Relation Age of Onset  Other Father       numbness and pain in limbs  Heart Disease Father   Cancer Father       Basal cell, MDS  Kidney Disease Father   Stroke Father   Other Sister       numbness and pain in limbs  High Blood Pressure Sister   Thyroid  disease Sister   Cancer Mother       Melanoma  Arthritis Mother   Lupus Other   Diabetes Other   Eczema Other   Other Other       asthma  Other Other       Fascioscapulohumeral Progressive Muscular Dystrophy  Other Other       Wegener's Granulomatosis  Diabetes Paternal Aunt   Diabetes Paternal Uncle   Rheum arthritis Paternal Grandmother  Social History[1]Review of Systems ROSAs per HPIVitals and  Physical Exam La's  height is 1.651 m (5' 5) and weight is 65.8 kg (145 lb). Her temporal temperature is 36.5 C (97.7 F). Her blood pressure is 132/78 and her pulse is 70. Her respiration is 20 and oxygen saturation is 100%.  Body mass index is 24.13 kg/m.No intake/output data recorded.Physical ExamVital Signs: Temp:  [35.9 C (96.6 F)-36.5 C (97.7 F)] 36.5 C (97.7 F)Heart Rate:  [60-70] 70Resp:  [17-20] 20BP: (94-135)/(60-78) 132/78 Body mass index is 24.13 kg/m.CONSTITUTIONAL:  Pleasant, resting comfortably.NEURO:  Alert and oriented, in no acute distress.  Grossly intact.HEENT:  Oropharynx clear, mucous membranes moist, anicteric scleraeLYMPH:  Supple without lymphadenopathy.   CARDIOVASCULAR:   A comprehensive cardiovascular exam was performed with details as follows:  Regular rate and rhythm, normal S1, S2, no S3 or S4.  There are no murmurs appreciated.  PMI is normal in position and character.  2+ radial and dorsalis pedis pulses bilaterally with no lower extremity edema noted. JVP is approximately 5 cm above the right atrium.  There are no carotid bruits noted bilaterally.  RESP:  Clear to auscultation bilaterally without wheezes, rales or rhonchi.GI:  Soft, nontender, normal active bowel sounds, nondistended.  No abdominal  bruits noted.MUSCULOSKELETAL:  No joint effusions or asymmetry.SKIN:  No rashes or lesions noted. Laboratory Data Hematology: Results in Past 730 DaysResult Component Current Result Previous Result WBC 4.8 (04/01/2024) Not in Time Range Hemoglobin 14.2 (04/01/2024) Not in Time Range Hematocrit 44 (04/01/2024) Not in Time Range Platelets 239 (04/01/2024) Not in Time Range Chemistry: Results in Past 730 DaysResult Component Current Result Previous Result Sodium 138 (04/01/2024) Not in Time Range Potassium 4.5 (04/01/2024) Not in Time Range Creatinine 0.54 (04/01/2024) Not in Time Range Glucose 97 (04/01/2024)  Not in Time Range Calcium 9.7 (04/01/2024) Not in Time Range Magnesium 2.2 (04/01/2024) Not in Time Range AST 80 (H) (04/01/2024) Not in Time Range ALT 85 (H) (04/01/2024) Not in Time Range TSH 2.47 (04/01/2024) Not in Time Range Coagulation Studies: No results found for requested labs within last 730 days. Cardiac: Results in Past 730 DaysResult Component Current Result Previous Result TROP T RANDOM High Sensitivity 84 (HH) (04/01/2024) Not in Time Range TROP T 0 HR High Sensitivity 92 (HH) (04/01/2024) Not in Time Range TROP T 1 HR High Sensitivity CANCELED (04/01/2024) Not in Time Range TROP T 3 HR High Sensitivity 92 (HH) (04/01/2024) Not in Time Range CK 448 (H) (04/01/2024) Not in Time Range NT-pro BNP 67 (04/01/2024) Not in Time Range Lipids: Results in Past 730 DaysResult Component Current Result Previous Result Cholesterol 193 (04/01/2024) Not in Time Range HDL 78 (H) (04/01/2024) Not in Time Range Triglycerides 46 (04/01/2024) Not in Time Range LDL Calculated 106 (04/01/2024) Not in Time Range Chol/HDL Ratio 2.5 (04/01/2024) Not in Time Range Cardiac/Imaging Data & Risk Scores ECG/Telemetry: Normal sinus rhythm with first-degree AV block.  No acute ST-T wave changesFRAIL Scoring  Impression and Plan Active Problems:  NSTEMI (non-ST elevated myocardial infarction) This is an 68 y.o. female with depression, GERD, IBS, migraines, breast cancer.  Presented with chest pain, shortness of breath.  Found to have elevated, downtrending troponins.  CT negative for acute PE.  First troponin was 92, 3-hour troponin was 84.  She is currently chest pain-free.  Etiology of her chest pain is unclear.  She was recently diagnosed with borderline hypertension, but her blood pressures have been fairly normal here.  She has no other strong cardiovascular risk factors.  Her EKG is reassuringly without acute ischemic changes.  While  atherosclerotic coronary disease remains a possibility, other possibilities include Takotsubo's cardiomyopathy, pericarditis, coronary vasospasm, coronary dissection.  Reassuringly, she is currently without cardiac symptoms at present.NSTEMI: With elevated troponin, chest pain symptoms, would consider ACS.- Start heparin drip for ACS- Start carvedilol 3.125 mg twice daily- She received aspirin 324 mg.  Transition to 81 mg daily- Can use nitro as needed for recurrent chest pain, but she is currently without symptoms- If symptoms recur, please obtain EKG acutely- Please check fasting lipid profile, hemoglobin A1c-Please obtain echocardiogram-Ongoing infectious testing pending- N.p.o. after midnight for possible angiogram, pending additional testingHypertension: Has been well-controlled since she has been here.  She did not tolerate lisinopril 2.5 mg recently started.  We are starting carvedilol 3.125 mg twice daily, as noted above.Cardiology will follow. Marily Konczal MACARIO SCHILLER, MD, PhDElectronically signed on 04/01/2024 at 11:41 AM. [1] Social HistorySocioeconomic History  Marital status: Married Tobacco Use  Smoking status: Former   Current packs/day: 0.00   Types: Cigarettes   Quit date: 10/07/1980   Years since quitting: 43.5  Smokeless tobacco: Never Substance and Sexual Activity  Alcohol use: Yes   Alcohol/week: 5.0 standard drinks of alcohol   Types: 5 Glasses of  wine per week  Drug use: No  Sexual activity: Yes   Partners: Male   Birth control/protection: Post-menopausal, Surgical Social History Narrative  Nurse at [employer]; diabetes education. Married. Daughter. No TOB. Wine with dinner 5 nights per week. Exercise with yoga and light walking. Sleeps 6-8 hours but never feel rested.

## 2024-04-01 NOTE — ED Provider Notes (Signed)
 History Chief Complaint Patient presents with  Illness  Chest Pain [name] is a 68 y.o. female with h/o depression, GERD, IBS, migraine headache, breast cancer, muscular dystrophy who presents today with chest pain and shortness of breath.  States since Wednesday, she has been feeling ill with cough, congestion, body ache and headache.  States that around 3 AM this morning, she started feeling sharp chest pain and shortness of breath. States the pain initially radiated to the back. States the pain is mild now, just 2/10. States her husband tested positive for COVID recently.  States her test came back negative.  States she has history of breast cancer.  Traveled here from [place]  2 months ago.  Denies new swelling in extremities.  Denies personal history of blood clots.  Denies oral estrogen use. 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  Cardiovascular:  Positive for chest pain. Physical Exam Triage VitalsTriage Start: Start, (04/01/24 0413)  First Recorded BP: 94/60, Resp: 18, Temp: 35.9 C (96.6 F), Temp src: TEMPORAL Oxygen Therapy SpO2: 97 %, Oximetry Source: Rt Hand, O2 Device: None (Room air), Heart Rate: 60, (04/01/24 0416)  .First Pain Reported 0-10  Pain Scale: 8, Pain Location/Orientation: Chest, (04/01/24 9583) Physical ExamVitals and nursing note reviewed. Constitutional:     Appearance: She is ill-appearing. She is not toxic-appearing or diaphoretic. HENT:    Head: Normocephalic and atraumatic.    Right Ear: External ear normal.    Left Ear: External ear normal.    Nose: Nose normal. No congestion. Eyes:    General: No scleral icterus.      Right eye: No discharge.       Left eye: No discharge.    Conjunctiva/sclera: Conjunctivae normal. Cardiovascular:    Rate and Rhythm: Normal rate and regular rhythm. Pulmonary:    Effort: Pulmonary effort is normal. No respiratory distress.    Breath sounds: No wheezing. Musculoskeletal:       General: Normal range of motion.    Cervical back: Normal range of motion.    Right lower leg: No edema.    Left lower leg: No edema. Skin:   Capillary Refill: Capillary refill takes less than 2 seconds.    Coloration: Skin is not jaundiced or pale.    Findings: No erythema. Neurological:    General: No focal deficit present.    Mental Status: She is alert and oriented to person, place, and time. Psychiatric:       Mood and Affect: Mood normal.       Behavior: Behavior normal. Medical Decision Making Assessment:  [name] is a 68 y.o. female with  h/o depression, GERD, IBS, migraine headache, breast cancer, muscular dystrophy who presents today with chest pain and shortness of breath.  States since Wednesday, she has been feeling ill with cough,  congestion, body ache and headache.  States that around 3 AM this morning, she started feeling sharp chest pain and shortness of breath. States the pain initially radiated to the back. States the pain is mild now, just 2/10. States her husband tested positive for COVID recently.  States her test came back negative.  States she has history of breast cancer.  Traveled here from [place]  2 months ago.  Denies new swelling in extremities.  Denies personal history of blood clots.  Denies oral estrogen use. BP 94/60 otherwise reassuring. On exam, pt ill appearing, slightly diaphoretic although breathing comfortably in no acute respiratory distress. Heart RRR. No lower extremity edema.Differential diagnosis:  ACSPEPneumoniaCOVID/flu/RSVCHF Myocarditis/pericarditisLow suspicion for endocarditis Plan:  Orders Placed This Encounter    COVID/Influenza A & B/RSV NAAT (PCR)    *Chest standard frontal and lateral views    CT angio chest    CBC and differential    BMP (SST)    RUQ panel (ED only)    Troponin T 0 HR High Sensitivity    Troponin T 1 HR W/ Delta High Sensitivity    Troponin T 3 HR W/ Delta High Sensitivity    NT-pro BNP    Initiate COVID precautions    Initiate droplet isolation    EKG 12 lead    EKG: follow up    Insert peripheral IVMedicationssodium chloride 0.9 % FLUSH REQUIRED IF PATIENT HAS IV (has no administration in time range)dextrose 5 % FLUSH REQUIRED IF PATIENT HAS IV (has no administration in time range)sodium chloride 0.9 % bolus 1,000 mL (has no administration in time range)EKG Interpretation:  Rate 59.  Sinus rhythm.  PR interval 264.  No previous to compare to.  No concerning ST changes or T wave inversionsED Course as of 04/01/24 0743 Sun Apr 01, 2024 0629 Pain is mild now. 1/10. Reporting just a dull ache.  9357 TROP T 0 HR High Sensitivity(!!): 92No previous to compare to.  0719 Pending repeat trop, CT angio  result, and card consult; pt signed out to the day team.  0727 Repeat EKG ordered.  9258 Spoke with cardiology. Recommended admission for NSTEMI, starting carvedilol 3.125 twice daily, and nitro as needed for chest pain.  CT angio chest Final Result  The examination is diagnostic for evaluation of the pulmonary arteries.  The exam is negative for pulmonary embolism.  Suspected focal area of bronchial dilatation and bronchial wall thickening within the superior segment of the right lower lobe.  END OF IMPRESSION  I have personally reviewed the images and the Resident's/Fellow's interpretation and agree with or edited the findings.    *Chest standard frontal and lateral views    (Results Pending) Labs Reviewed CBC AND DIFFERENTIAL - Abnormal; Notable for the following components:     Result Value  MCV 103 (*)   RDW 15.7 (*)   All other components within normal limits TROPONIN T 0 HR HIGH SENSITIVITY (IP/ED ONLY) - Abnormal; Notable for the following components:  TROP T 0 HR High Sensitivity 92 (*)   All other components within normal limits RUQ PANEL (ED ONLY) - Abnormal; Notable for the following components:  AST 80 (*)   ALT 85 (*)   All other components within normal limits TROPONIN T RANDOM HIGH SENSITIVITY - Abnormal; Notable for the following components:  TROP T RANDOM High Sensitivity 84 (*)   All other components within normal limits COVID/INFLUENZA A & B/RSV NAAT (PCR) BASIC METABOLIC PANEL TROPONIN T 1 HR W/ DELTA HIGH SENSITIVITY RUQ PANEL (ED ONLY) NT-PRO BNP NT-PRO BNP MAGNESIUM MAGNESIUM TROPONIN T 3 HR W/ DELTA HIGH SENSITIVITY (IP/ED ONLY) Kamilo Och, PAAuthor:  Web Designer, PA [1] Past Medical History:Diagnosis Date  Acne   Actinic keratosis   Allergy history unknown   Anemia   Back pain   Resolved  Complication of anesthesia   Novacaine Dizzy   Depression   Dermatitis 02/09/2012  Dermatitis   Diverticulosis   GERD (gastroesophageal reflux disease)   Gout   Herpes   Genital  IBS (irritable bowel syndrome)   Inflammatory arthritis 03/07/2017  Lumbar stenosis   Migraine   Neuropathy   Pulmonary fibrosis   Recurrent sinus infections   STD (sexually transmitted disease)   Gential Herpes  Urticaria   Varicella  [2] Past Surgical History:Procedure Laterality Date  ANKLE SURGERY Left 11/10/2022  cyst removed from behind a bone  CESAREAN SECTION, CLASSIC  06/01/1983  ENDOMETRIAL ABLATION  06/01/2007  INCONTINENCE SURGERY  05/31/2008  NASAL POLYP SURGERY  05/31/2001  RECTOCELE REPAIR  05/31/2008  SPHINCTEROPLASTY  05/31/2008  ABSCESS AFTER SURGERY [3] Social HistoryTobacco Use  Smoking status: Former   Current packs/day: 0.00   Types: Cigarettes   Quit date: 10/07/1980   Years since quitting: 43.5  Smokeless tobacco: Never Substance Use Topics  Alcohol use: Yes   Alcohol/week: 5.0 standard drinks of alcohol   Types: 5 Glasses of wine per week  Drug use: No  Chante Mayson, Taneka Espiritu, PA11/02/25 9256

## 2024-04-01 NOTE — Plan of Care (Signed)
 Problem: Pain/ComfortGoal: Patient's pain or discomfort is manageableOutcome: Progressing towards goal Problem: Cardiac and Respiratory managementGoal: Evaluate cardiopulmonary functionOutcome: Progressing towards goal Problem: SafetyGoal: Patient will remain free of fallsOutcome: Maintaining Problem: MobilityGoal: Functional status is maintained or improved - GeriatricOutcome: Maintaining Problem: PsychosocialGoal: Demonstrates ability to cope with illnessOutcome: Maintaining Problem: Cognitive functionGoal: Cognitive function will be maintained or return to baselineDescription: Interventions:Delirium AssessmentLIVEBAR AssessmentOutcome: MaintainingGoal: Lines and tethers will be removed as appropriateOutcome: MaintainingGoal: Vital signs will be within normal limitsOutcome: Maintaining Problem: Cardiac and Respiratory managementGoal: Evaluate Cardiac RhythmOutcome: MaintainingGoal: No life threatening arrhythmiaOutcome: Maintaining

## 2024-04-02 ENCOUNTER — Inpatient Hospital Stay

## 2024-04-02 ENCOUNTER — Other Ambulatory Visit

## 2024-04-02 ENCOUNTER — Ambulatory Visit
Admission: RE | Admit: 2024-04-02 | Discharge: 2024-04-02 | Disposition: A | Discharge status: Other Acute Inpatient Hospital | Source: Ambulatory Visit

## 2024-04-02 ENCOUNTER — Encounter: Admission: RE | Disposition: A | Discharge status: Home Health | Payer: Self-pay | Source: Ambulatory Visit

## 2024-04-02 DIAGNOSIS — R9431 Abnormal electrocardiogram [ECG] [EKG]: Secondary | ICD-10-CM | POA: Insufficient documentation

## 2024-04-02 DIAGNOSIS — R079 Chest pain, unspecified: Secondary | ICD-10-CM

## 2024-04-02 DIAGNOSIS — J069 Acute upper respiratory infection, unspecified: Secondary | ICD-10-CM

## 2024-04-02 DIAGNOSIS — R931 Abnormal findings on diagnostic imaging of heart and coronary circulation: Secondary | ICD-10-CM | POA: Insufficient documentation

## 2024-04-02 DIAGNOSIS — G7102 Facioscapulohumeral muscular dystrophy: Secondary | ICD-10-CM

## 2024-04-02 DIAGNOSIS — I214 Non-ST elevation (NSTEMI) myocardial infarction: Secondary | ICD-10-CM | POA: Insufficient documentation

## 2024-04-02 DIAGNOSIS — I2510 Atherosclerotic heart disease of native coronary artery without angina pectoris: Secondary | ICD-10-CM | POA: Insufficient documentation

## 2024-04-02 DIAGNOSIS — I10 Essential (primary) hypertension: Secondary | ICD-10-CM | POA: Insufficient documentation

## 2024-04-02 DIAGNOSIS — Z20822 Contact with and (suspected) exposure to covid-19: Secondary | ICD-10-CM

## 2024-04-02 DIAGNOSIS — Z87891 Personal history of nicotine dependence: Secondary | ICD-10-CM | POA: Insufficient documentation

## 2024-04-02 HISTORY — PX: PR CATH PLMT L HRT & ARTS W/NJX & ANGIO IMG S&I: 93458

## 2024-04-02 HISTORY — PX: CARDIAC CATHETERIZATION: SHX172

## 2024-04-02 HISTORY — PX: PR L HRT CATH W/NJX L VENTRICULOGRAPHY IMG S&I: 93452

## 2024-04-02 LAB — ECHO COMPLETE
Aortic Arch Diameter: 2.2 cm
Aortic Diameter (mid tubular): 3.9 cm
Aortic Diameter (sinus of Valsalva): 4 cm
BMI: 24.13 kg/m2
BP Diastolic: 84 mmHg
BP Systolic: 125 mmHg
BSA: 1.74 m2
Deceleration Time - MV: 217 ms
E/A ratio: 0.87
Echo RV Stroke Work Index Estimate: 681.9 mmHg•mL/m2
Heart Rate: 61 {beats}/min
Height: 65 in
IVC Diameter: 1.6 cm
LA Diameter BSA Index: 2 cm/m2
LA Diameter Height Index: 2.1 cm/m
LA Diameter: 3.5 cm
LA Systolic Vol BSA Index: 29.3 mL/m2
LA Systolic Vol Height Index: 30.8 mL/m
LA Systolic Volume: 50.9 mL
LV ASE Mass BSA Index: 81.8 g/m2
LV ASE Mass Height 2.7 Index: 36.8 g/m2
LV ASE Mass Height Index: 86.2 gm/m
LV ASE Mass: 142.3 g
LV CO BSA Index: 1.98 L/min/m2
LV Cardiac Output: 3.45 L/min
LV Diastolic Volume Index: 52.6 mL/m2
LV Posterior Wall Thickness: 0.9 cm
LV SV - LVOT SV Diff: 15.6 %
LV SV - LVOT SV Diff: 8.81 mL
LV SV BSA Index: 32.5 mL/m2
LV SV Height Index: 34.2 mL/m
LV Septal Thickness: 1.02 cm
LV Stroke Volume: 56.5 mL
LV Systolic Volume Index: 20.2 mL/m2
LV wall/cavity ratio: 0.43
LVED Diameter BSA Index: 2.56 cm/m2
LVED Diameter Height Index: 2.7 cm/m
LVED Diameter: 4.45 cm
LVED Volume BSA Index: 52.6 mL/m2
LVED Volume BSA Index: 53 mL/m2
LVED Volume Height Index: 55.5 mL/m
LVED Volume: 91.6 mL
LVEF (Volume): 62 %
LVES Diameter BSA Index: 1.9 cm/m2
LVES Diameter Height Index: 2 cm/m
LVES Diameter: 3.3 cm
LVES Volume BSA Index: 20 mL/m2
LVES Volume BSA Index: 20.2 mL/m2
LVES Volume Height Index: 21.3 mL/m
LVES Volume: 35.1 mL
LVOT Area (calculated): 2.63 cm2
LVOT Cardiac Index: 1.67 L/min/m2
LVOT Cardiac Output: 2.91 L/min
LVOT Diameter: 1.83 cm
LVOT PWD VTI: 18.14 cm
LVOT PWD Velocity (mean): 58.9 cm/s
LVOT PWD Velocity (peak): 87.5 cm/s
LVOT SV BSA Index: 27.41 mL/m2
LVOT SV Height Index: 28.9 mL/m
LVOT Stroke Rate (mean): 154.8 mL/s
LVOT Stroke Rate (peak): 230 mL/s
LVOT Stroke Volume: 47.69 cc
MR Regurgitant Fraction (LV SV Mtd): 0.16
MR Regurgitant Volume (LV SV Mtd): 8.8 mL
MV Peak A Velocity: 58.9 cm/s
MV Peak E Velocity: 51.4 cm/s
Mitral Annular E/Ea Vel Ratio: 7.72
Mitral Annular Ea Velocity: 6.66 cm/s
Peak Gradient - TR: 21 mmHg
Peak Velocity - TR: 228.97 cm/s
Pulmonary Vascular Resistance Estimate: 6.1 mmHg
RA Pressure Estimate: 5 mmHg
RA Volume BSA Index: 16.1 mL/m2
RA Volume Height Index: 17 mL/m
RA Volume: 28 mL
RR Interval: 983.61 ms
RV Peak Systolic Pressure: 26 mmHg
RVED Diameter BSA Index: 1.8 cm/m2
RVED Diameter Height Index: 1.9 cm/m
RVED Diameter: 3.14 cm
SEM (LVOT Mean) mN-s: 29.51 mN-s
SEM (LVOT peak) mN-s: 43.84 mN-s
Tricuspid Annular S velocity: 8 cm/s
Tricuspid Annular Systolic Plane Excursion (TAPSE): 19 cm
Weight (lbs): 145 [lb_av]
Weight: 2320 [oz_av]

## 2024-04-02 LAB — HEPATIC FUNCTION PANEL
ALT: 67 U/L — ABNORMAL HIGH (ref 0–35)
AST: 63 U/L — ABNORMAL HIGH (ref 0–35)
Albumin: 3.6 g/dL (ref 3.5–5.2)
Alk Phos: 60 U/L (ref 35–105)
Bili,Indirect: 0.3 mg/dL (ref 0.1–1.0)
Bilirubin,Direct: 0.2 mg/dL (ref 0.0–0.3)
Bilirubin,Total: 0.5 mg/dL (ref 0.0–1.2)
Total Protein: 5.4 g/dL — ABNORMAL LOW (ref 6.3–7.7)

## 2024-04-02 LAB — CBC AND DIFFERENTIAL
Baso # K/uL: 0.1 THOU/uL (ref 0.0–0.2)
Eos # K/uL: 0.4 THOU/uL (ref 0.0–0.5)
Hematocrit: 38 % (ref 34–49)
Hemoglobin: 12.6 g/dL (ref 11.2–16.0)
IMM Granulocytes #: 0 THOU/uL (ref 0–0)
IMM Granulocytes: 0.3 %
Lymph # K/uL: 1.2 THOU/uL (ref 1.0–5.0)
MCV: 102 fL — ABNORMAL HIGH (ref 75–100)
Mono # K/uL: 0.3 THOU/uL (ref 0.1–1.0)
Neut # K/uL: 1.7 THOU/uL (ref 1.5–6.5)
Nucl RBC # K/uL: 0 THOU/uL
Nucl RBC %: 0 /100{WBCs} (ref 0.0–0.2)
Platelets: 226 THOU/uL (ref 150–450)
RBC: 3.7 MIL/uL — ABNORMAL LOW (ref 4.0–5.5)
RDW: 15.8 % — ABNORMAL HIGH (ref 0.0–15.0)
Seg Neut %: 46.7 %
WBC: 3.7 THOU/uL (ref 3.5–11.0)

## 2024-04-02 LAB — BASIC METABOLIC PANEL
Anion Gap: 9 (ref 7–16)
CO2: 23 mmol/L (ref 20–28)
Calcium: 9 mg/dL (ref 8.6–10.2)
Chloride: 108 mmol/L (ref 96–108)
Creatinine: 0.53 mg/dL (ref 0.51–0.95)
Glucose: 99 mg/dL (ref 60–99)
Lab: 9 mg/dL (ref 6–20)
Potassium: 4.1 mmol/L (ref 3.3–5.1)
Sodium: 140 mmol/L (ref 133–145)
eGFR BY CREAT: 101

## 2024-04-02 LAB — MAGNESIUM: Magnesium: 2 mg/dL (ref 1.6–2.5)

## 2024-04-02 LAB — HEMOGLOBIN A1C: Hemoglobin A1C: 5.5 % (ref <=5.6)

## 2024-04-02 LAB — UNFRAC HEPARIN, ANTI XA: UNFRAC HEPARIN,ANTI XA: 0.62 (ref 0.30–0.70)

## 2024-04-02 SURGERY — CORONARY ANGIO WITH POSSIBLE PTCA
Anesthesia: Moderate Sedation

## 2024-04-02 MED ORDER — LIDOCAINE HCL 1 % IJ SOLN *I*
INTRAMUSCULAR | Status: AC
Start: 2024-04-02 — End: 2024-04-02
  Filled 2024-04-02: qty 20

## 2024-04-02 MED ORDER — HEPARIN SODIUM (PORCINE) 1000 UNIT/ML IJ SOLN *WRAPPED*
Status: AC
Start: 2024-04-02 — End: 2024-04-02
  Filled 2024-04-02: qty 10

## 2024-04-02 MED ORDER — FENTANYL CITRATE 50 MCG/ML IJ SOLN *WRAPPED*
INTRAMUSCULAR | Status: AC
Start: 2024-04-02 — End: 2024-04-02
  Filled 2024-04-02: qty 2

## 2024-04-02 MED ORDER — FENTANYL CITRATE 50 MCG/ML IJ SOLN *WRAPPED*
INTRAMUSCULAR | Status: DC | PRN
Start: 2024-04-02 — End: 2024-04-02
  Administered 2024-04-02: 25 ug via INTRAVENOUS

## 2024-04-02 MED ORDER — HEPARIN SODIUM (PORCINE) 1000 UNIT/ML IJ SOLN *WRAPPED*
Status: DC | PRN
Start: 2024-04-02 — End: 2024-04-02
  Administered 2024-04-02: 5000 [IU] via INTRAVENOUS

## 2024-04-02 MED ORDER — ACETAMINOPHEN 325 MG PO TABS *I*
650.0000 mg | ORAL_TABLET | Freq: Four times a day (QID) | ORAL | Status: DC | PRN
Start: 2024-04-02 — End: 2024-04-02
  Administered 2024-04-02: 650 mg via ORAL

## 2024-04-02 MED ORDER — MIDAZOLAM HCL 1 MG/ML IJ SOLN *I* WRAPPED
INTRAMUSCULAR | Status: AC
Start: 2024-04-02 — End: 2024-04-02
  Filled 2024-04-02: qty 2

## 2024-04-02 MED ORDER — MIDAZOLAM HCL 1 MG/ML IJ SOLN *I* WRAPPED
INTRAMUSCULAR | Status: DC | PRN
Start: 2024-04-02 — End: 2024-04-02
  Administered 2024-04-02: 2 mg via INTRAVENOUS

## 2024-04-02 MED ORDER — IOHEXOL 350 MG/ML (OMNIPAQUE) IV SOLN *I*
INTRAVENOUS | Status: DC | PRN
Start: 2024-04-02 — End: 2024-04-02
  Administered 2024-04-02: 51 mL via INTRAVENOUS

## 2024-04-02 MED ORDER — ACETAMINOPHEN 325 MG PO TABS *I*
ORAL_TABLET | ORAL | Status: AC
Start: 2024-04-02 — End: 2024-04-02
  Filled 2024-04-02: qty 2

## 2024-04-02 MED ORDER — PERFLUTREN PROTEIN A MICROSPH (OPTISON) IV SUSP *I*
1.5000 mL | INTRAVENOUS | Status: AC | PRN
Start: 2024-04-02 — End: 2024-04-02
  Administered 2024-04-02: 1.5 mL via INTRAVENOUS

## 2024-04-02 SURGICAL SUPPLY — 11 items
CATH EXPO 5F PIG 145 (Catheter) ×1 IMPLANT
CATH OPTI RADI 5F TIG 4.0 (Catheter) ×1 IMPLANT
COVER ULTRASOUND PROBE TP 6 X 48IN (Supply) ×1 IMPLANT
DEVICE COMPR L24CM REG RAD TR BND (Closure Device) ×1 IMPLANT
ELECTRODE DEFIBRILLATOR ADULT MULTIFUNCTION EDGE SYSTEM RTS (Supply) ×1 IMPLANT
GUIDEWIRE PERIPH VASC 0.035INX180CM J TIP 1.5MM SILVERWAY (Guidewire) ×1 IMPLANT
KIT ANGIO MFLD PT BT2000 W/ TRNSDUC (Supply) ×1 IMPLANT
KIT HAND CONTROL 65IN TUBING ACIST ANGIOTOUCH ATX65 (Supply) ×1 IMPLANT
PACK CUSTOM CARDIAC CATH PACK (Pack) ×1 IMPLANT
SHEATH GLIDESHEATH 5FR P172577 (Sheath) ×1 IMPLANT
SHIELD RADPAD INTERVENTIONAL (Supply) ×1 IMPLANT

## 2024-04-02 NOTE — INTERIM OP NOTE (Signed)
 Interim Procedure Note      Date of Procedure: 04/02/24     Operators: Surgeons and Role:   DEWAINE Bonnie Purchase, MD - Primary   * Ethyl Almarie LABOR, [place] - Physician Assistant - Assisting     Pre-procedure Diagnosis: Pre-Op Diagnosis Codes:    * NSTEMI (non-ST elevated myocardial infarction) [I21.4]     Post-procedureDiagnosis: Post-Op Diagnosis Codes:   * NSTEMI (non-ST elevated myocardial infarction) [I21.4]     Procedure(s) Performed:  Procedure(s) (LRB):Coronary Angio with Possible PTCA (N/A)     Findings: Normal coronariesLVEDP 15   Intervention(s): none   Anesthesia Type: Moderate sedation     Access/Closure: Right radial artery (5 Fr) / TR band      Contrast used:  mls     Estimated Blood Loss: 10 mls     Post Unit: [facility]cl holding to [place]        Patient Condition: good     Complications: none   PLAN Return to [place] after TR removedMedical management   Signed:Renelle Stegenga A [place], PA on 04/02/2024 at 6:11 PM

## 2024-04-02 NOTE — Progress Notes (Signed)
 Hospital Medicine Progress NoteLOS: 1        Full Code Overnight Events/Subjective No significant overnight events. Pt reports she has not had any new chest pain overnight or currently. Feels a little better overall. Objective  Objective BP: (124-147)/(62-83) Temp:  [35.3 C (95.5 F)-36.5 C (97.7 F)] Temp src: Temporal (11/03 0357)Heart Rate:  [63-70] Resp:  [16-20] SpO2:  [95 %-100 %]  Physical Exam:Constitutional: NADEyes:  normal EOMs, conjunctiva normal, no scleral icterusEars, Nose, Mouth and Throat: moist mucous membranesCardiovascular: RRR, no murmurs, rubs or gallops; no lower extremity edema Respiratory: lungs CTA bilaterally; no wheezing, rales or rhonchi Gastrointestinal: soft, non-distended, mild diffuse tendernessMusculoskeletal: ambulates with rolling walkerSkin: warm and dryNeurological: A&Ox3; no focal deficitPsychiatric: normal affectLab, Microbiology, Cardiac & Imaging Studies:Personally reviewedIntake/Output Summary (Last 24 hours) at 04/02/2024 0619Last data filed at 04/01/2024 2341Gross per 24 hour Intake 107.88 ml Output -- Net 107.88 ml     Assessment [name] is a  68 y.o. 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 Elevated adynamic troponins. Echo today with preserved systolic function, but focal mid PDA/LCx regional wall motion abnormality, so cardiology took pt to [facility] for coronary angiogram today.  Plan #Elevated Troponins#Chest pain #Possible NSTEMI/UA, Troponins elevated but adynamic (07,15,07),  patient is currently stable and without chest pain. EKG NSR without  ischemic changes. Possible viral illness with infectious symptoms. CT angio chest negative for PE. S/p aspirin load 324mg  - Continuous telemetry- Obtain A1C-TSH normal, lipid panel LDL 106- Daily CBC/BMP/Mag - maintain K >4 and Mg >2- Continue coreg 3.125mg  BID- Sublingual nitroglycerin 0.4mg  PRN for chest pain, can repeat q44min x3 doses- Start heparin drip for ACS - Aspirin 81 mg daily- If symptoms recur, please obtain EKG acutely- Echo preserved systolic function, but focal mid PDA/LCx regional wall motion abnormality, pt transferred to cath lab at [facility] today-Cardiology following, appreciate recs. -NPO, may resume diet after she returns #Cough, body aches-likely viral illness, symptoms improving-COVID/FLU/RSV neg-extended resp panel negative-CXR no acute findings #HTN- hold home lisinopril 2.5mg  #Transaminitis- Mild elevation in liver enzymes (AST 80, ALT 85), possibly in setting of viral illness. 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: Azathioprine 50 mg TIDHypothyroidism: Levothyroxine  50 mcg daily, T3 15 mcg dailyAsthma/Allergies: Fluticasone -salmeterol, Montelukast  10 mg daily, zyrtecGERD: Omeprazole 20 mg BID  Med Rec Completed: yes  Medications: DVT:  enoxaparin (LOVENOX) injection 40 mg/0.4 mLBowel: polyethylene glycol (MIRALAX) 3350 17gm PO,senna (SENOKOT) 8.6 mg tablet (last BM  None)Pain: tylenol  PRN Maintenance: Lines/Tubes: pIV  Fluids: PO Diet: Diet regular Disposition: PT/OT/SLP: PT orderedDischarge to: TBDBarriers to Discharge: pending clinical courseMRDD: 04/04/2024  Regina Breaker, MD 04/02/2024 at 6:19 AMMeds & Labs:  Medications Scheduled Meds: senna  2 tablet Oral Nightly  cyanocobalamin  2,000 mcg Oral Daily  cholecalciferol  1,000 units Oral Daily  levothyroxine   50 mcg Oral Daily @ 0600  DULoxetine   60 mg Oral Daily  arformoterol  15 mcg Nebulization 2 times per day  And  budesonide  0.25 mg Nebulization 2 times per day  gabapentin   300 mg Oral 4x Daily  montelukast   10 mg Oral Daily  pantoprazole  40 mg Oral BID AC  tiZANidine  2 mg Oral TID  liothyronine   15 mcg Oral Daily  carvedilol  3.125 mg Oral 2 times per day  azaTHIOprine  50 mg Oral TID  cycloSPORINE  1 drop Both Eyes 2 times per day  cetirizine  10 mg Oral Daily  aspirin  81 mg Oral Daily  folic acid  1 mg Oral Daily Continuous Infusions: heparin 12 Units/kg/hr (04/01/24 2341) PRN Meds:.sodium chloride, dextrose, acetaminophen , polyethylene glycol, melatonin, heparinLab Frequency Next Occurrence EKG: follow up PRN  Activity as tolerated PRN  EKG 12 lead PRN  Basic metabolic panel DAILY  CBC and differential daily x 3 days DAILY  Magnesium DAILY  Unfrac heparin, anti XA PRN  COVID-19 NAAT (PCR) ONE TIME  COVID-19 NAAT (PCR) ONE TIME

## 2024-04-02 NOTE — H&P (Signed)
 Subjective: The patient is a 68 year old female with a cardiac history of HTN. [name] presents for evaluation of chest pressure/discomfort. EKG with sinus and first degree AVB but no ischemia. Troponin peak at 92. Husband tested positive for COVID on Wednesday. Patient reported chills soon after going into the weekend. Had chest pain with last episode Sunday around 3AM. TTE today:Normal LV size and systolic function with focal PDA/LCx wall motion abnormality. Calculated LVEF is 62%.Mild aortic and trace mitral valve regurgitation. Mild tricuspid valve regurgitation.Normal right ventricular size and function with estimated normal systolic PA pressure.Mild aortic root and ascending aortic enlargement.No prior report for comparison. Additional Risk FactorsNoneHistory:Past Medical History[1]Allergies: Allergies[2]No Iodinated Contrast AllergyPrior to Admission Medications:Medications Prior to Admission Medication Sig  Cholecalciferol (VITAMIN D-3) 125 MCG (5000 UT) TABS Take 5,000 units by mouth daily.  Vitamin B-12 (CYANOCOBALAMIN) 5000 MCG disintegrating tablet Take 1 tablet (5,000 mcg total) by mouth daily.  NONFORMULARY, OTHER, ORDER *I* Estriol-DHEA 2-20mg /ml compounded cream: place 1 g vaginally once daily  gabapentin  300 mg capsule Take 1 capsule (300 mg total) by mouth 4 times daily.  magnesium oxide 500 mg tablet Take 1 tablet (500 mg total) by mouth 3 times daily.  montelukast  (SINGULAIR ) 10 mg tablet Take 1 tablet (10 mg total) by mouth daily. (In the afternoon)  multi-vitamin (MULTIVITAMIN ADULT) tablet Take 1 tablet by mouth daily.  albuterol  HFA (PROVENTIL , VENTOLIN ) 108 (90 Base) MCG/ACT inhaler Inhale 1-2 puffs into the lungs every 4-6 hours as needed for Wheezing.  omeprazole 20 mg capsule Take 1 capsule (20 mg total) by mouth 2 times daily.  tiZANidine 4 mg tablet Take 1 tablet (4 mg total) by mouth every 8 hours as needed for Muscle  spasms.  NONFORMULARY, OTHER, ORDER *I* T3 15 mcg SR compounded capsule: take 1 capsule (15 mcg total) by mouth every morning  NONFORMULARY, OTHER, ORDER *I* Naltrexone 6 mg with Ginger Root compounded capsule: take 1 capsule by mouth 2 times daily  azaTHIOprine (IMURAN) 50 MG tablet Take 1 tablet (50 mg total) by mouth 3 times daily.  cycloSPORINE (VEVYE) 0.1 % ophthalmic solution Place 1 drop into both eyes every 12 hours.  Cannabinoids (OTC CBD PRODUCT) Take 25 mg by mouth 2 times daily  triamcinolone (NASACORT ALLERGY 24HR) 55 MCG/ACT inhaler Spray 1 spray into each nostril daily as needed.  omega-3 fatty acids Take 1 capsule by mouth daily.  Multiple Vitamins-Minerals (EYE-VITES) TABS Take 4 capsules by mouth daily. (HydroEye softgels)  levothyroxine  50 mcg tablet Take 1 tablet (50 mcg total) by mouth daily (before breakfast).  valACYclovir  (VALTREX ) 1 gm tablet Take 1 tablet (1,000 mg total) by mouth.  WIXELA INHUB 250-50 MCG/ACT diskus inhaler Inhale 1 puff into the lungs 2 times daily.  DULoxetine  (CYMBALTA ) 60 MG DR capsule Take 1 capsule (60 mg total) by mouth daily.  cetirizine (ZYRTEC) 10 MG tablet Take 1 tablet (10 mg total) by mouth every morning.  Probiotic Product (PROBIOTIC FORMULA PO) Take 1 capsule by mouth daily Active Hospital Medications:No current facility-administered medications for this encounter. Facility-Administered Medications Ordered in Other Encounters Medication Dose Route Frequency  [MAR Hold - Suspended Admission] perflutren protein A microsph (OPTISON) injection 1.5 mL  1.5 mL Intravenous PRN  [MAR Hold - Suspended Admission] sodium chloride 0.9 % FLUSH REQUIRED IF PATIENT HAS IV  0-500 mL/hr Intravenous PRN  [MAR Hold - Suspended Admission] dextrose 5 % FLUSH REQUIRED IF PATIENT HAS IV  0-500 mL/hr Intravenous PRN  [MAR Hold - Suspended Admission] acetaminophen  (  TYLENOL ) tablet 1,000 mg  1,000 mg Oral TID PRN  [MAR Hold -  Suspended Admission] senna (SENOKOT) tablet 2 tablet  2 tablet Oral Nightly  [MAR Hold - Suspended Admission] polyethylene glycol (GLYCOLAX,MIRALAX) powder 17 g  17 g Oral Daily PRN  [MAR Hold - Suspended Admission] melatonin tablet 6 mg  6 mg Oral QHS PRN  [MAR Hold - Suspended Admission] cyanocobalamin (Vitamin B-12) tablet 2,000 mcg  2,000 mcg Oral Daily  [MAR Hold - Suspended Admission] cholecalciferol (VITAMIN D) tablet 1,000 units  1,000 units Oral Daily  [MAR Hold - Suspended Admission] levothyroxine  (SYNTHROID , LEVOTHROID) tablet 50 mcg  50 mcg Oral Daily @ 0600  [MAR Hold - Suspended Admission] DULoxetine  (CYMBALTA ) DR capsule 60 mg  60 mg Oral Daily  [MAR Hold - Suspended Admission] arformoterol (BROVANA) 15 mcg/69mL nebulizer solution 15 mcg  15 mcg Nebulization 2 times per day  And  [MAR Hold - Suspended Admission] budesonide (PULMICORT) nebulizer suspension 0.25 mg  0.25 mg Nebulization 2 times per day  [MAR Hold - Suspended Admission] gabapentin  (NEURONTIN ) capsule 300 mg  300 mg Oral 4x Daily  [MAR Hold - Suspended Admission] montelukast  (SINGULAIR ) tablet 10 mg  10 mg Oral Daily  [MAR Hold - Suspended Admission] pantoprazole (PROTONIX) EC tablet 40 mg  40 mg Oral BID AC  [MAR Hold - Suspended Admission] tiZANidine (ZANAFLEX) tablet 2 mg  2 mg Oral TID  [MAR Hold - Suspended Admission] liothyronine  (CYTOMEL ) tablet 15 mcg  15 mcg Oral Daily  [MAR Hold - Suspended Admission] carvedilol (COREG) tablet 3.125 mg  3.125 mg Oral 2 times per day  [MAR Hold - Suspended Admission] azaTHIOprine (IMURAN) tablet 50 mg  50 mg Oral TID  [MAR Hold - Suspended Admission] cycloSPORINE (RESTASIS) 0.05 % ophthalmic emulsion 1 drop  1 drop Both Eyes 2 times per day  [MAR Hold - Suspended Admission] cetirizine (ZyrTEC) tablet 10 mg  10 mg Oral Daily  [MAR Hold - Suspended Admission] heparin 100 units/mL infusion  0-30 Units/kg/hr Intravenous Continuous  [MAR Hold - Suspended  Admission] Heparin Bolus(es) from infusion 100 units/mL - PRN low anti-Xa  0-4,000 units Intravenous PRN  [MAR Hold - Suspended Admission] aspirin chewable tablet 81 mg  81 mg Oral Daily  [MAR Hold - Suspended Admission] folic acid (FOLVITE) tablet 1 mg  1 mg Oral Daily  Objective: Physical ExamVitals:Blood pressure 118/83, pulse 66, temperature 36.8 C (98.2 F), temperature source Tympanic, resp. rate 21, SpO2 98%.   Pulses: L radial  R radial  L Femoral  R Femoral  L posterior tibial  R posterior tibial  L dorsalis pedis  R dorsalis pedis  Jugular venous pressure: normalAirway Visibility: soft palate, uvula, and posterior pharynxBreath sounds: clearCardiovascular:  normal S1,S2 without murmur, rubs, gallopsAbdomen: Soft, NT, + NABS Extremity: no pitting edema in LE bilaterally. NTNeuro exam: awake, alert, and oriented x 3. No gross neurologic deficitsLab Review Lab Results Component Value Date  NA 140 04/02/2024  K 4.1 04/02/2024  CL 108 04/02/2024  CO2 23 04/02/2024 Lab Results Component Value Date  HGB 12.6 04/02/2024  HCT 38 04/02/2024  PLT 226 04/02/2024 Estimated Creatinine Clearance: 92.68 mL/min (based on SCr of 0.53 mg/dL).Anesthesiologist's Physical Status rating of the patient: Class III: Severe Systemic DiseasePlan for sedation: ModerateI am evaluating the patient immediately prior to admission of sedation medication. The plan for sedation remains appropriate.Assessment: The patient is a 68 year old female with a cardiac history of HTN. [name] presents for evaluation of chest pressure/discomfortIndications for  procedure: acute coronary syndrome Plan: Cardiac cath to rule out ischemic CAD.Possible angioplasty.Renal: GFR 101 The procedure and risks described to patient including risk of CVA, MI, bleeding, emergency surgery, death, allergic reaction, infection and exposure to  radiation. Regina Nataliya Graig, MD [1] Past Medical History:Diagnosis Date  Acne   Actinic keratosis   Allergy history unknown   Anemia   Asthma 05/10/2019  Back pain   Resolved  Complication of anesthesia   Novacaine Dizzy  Depression   Dermatitis 02/09/2012  Dermatitis   Diverticulosis   Essential hypertension 01/09/2024  GERD (gastroesophageal reflux disease)   Gout   Herpes   Genital  IBS (irritable bowel syndrome)   Inflammatory arthritis 03/07/2017  Lumbar stenosis   Migraine   Neuropathy   Pulmonary fibrosis   Recurrent sinus infections   STD (sexually transmitted disease)   Gential Herpes  Urticaria   Varicella  [2] AllergiesAllergen Reactions  Demerol Nausea And Vomiting  Environmental Allergies   Erythromycin    ABDOMINAL PAIN  Gluten Meal    BLOATING, SNEEZING, WATERY EYES, NEUROPATHY FLARE  Sulfa Antibiotics Hives  Amoxicillin  Hives and Other (See Comments)   Patient's spouse does NOT recall Amoxicillin  allergy. PCP records do NOT indicate patient has Amoxicillin  allergy.Med History Tech discussed Amoxicillin  allergy with patient's spouse Redell on 04/02/24. Please see Antimicrobial Stewardship Chart Note for additional details.  Nsaids Other (See Comments)   Stomach irritation  Plaquenil [Hydroxychloroquine] Other (See Comments)   Blurred vision

## 2024-04-02 NOTE — Progress Notes (Signed)
 Penicillin Allergy HistoryPerson providing history: Spouse/PartnerWhat drug(s) are you allergic to? AmoxicillinWhat symptom(s) did you experience? Unknown When did the most recent reaction occur? UnknownHow long after taking the drug did the symptom(s) occur? Unknown Did you experience any blistering or ulcers on the skin or in the mouth? Unknown Did you receive treatment for the reaction? Unknown What treatment did you receive? UnknownSince the initial reaction, have you seen an Allergy provider and/or received any testing (e.g. skin test, direct challenge) for this medication? NoIn the past 5 days, have you received any of the following drugs: cetirizine (Zyrtec), chlorpheniramine, desloratadine (Clarinex), levocetirizine (Xyzal), loratadine (Claritin), fexofenadine (Allegra), hydroxyzine (Atarax), diphenhydramine (Benadryl), promethazine (Phenergan)? NoIf yes, when was your last dose? N/a Patients born as female sex and age less than 13: Are you pregnant or is there a chance you could be pregnant?  N/aWho is your primary care doctor? Dr. Eleanor Lawrence [phone] pharmacies do you use? (name and location) [facility] [address] [facility] [place] Comments: Spouse states he does NOT recall patient ever telling him she has an allergy to Penicillin.Penicillin is NOT listed as an active medication in PCP records. Redell states  PCP would have the most up-to-date list of allergies for patient.Writer reviewed allergy list on file for patient within [facility] system (patient admitted 12/04/23) via Care Everywhere, and Penicillin is NOT listed as an active allergy.This Penicillin Allergy History was performed by Marissa Kalen, CPhT, with [facility]acyRyan Aundrea Higginbotham, PharmD, BCGP Transitions of Care Pharmacy [facility] Phone: [phone]

## 2024-04-02 NOTE — Progress Notes (Signed)
 [facility]. Report given to receiving facility, belongings sent with patient. Patient sent on hep gtt 12 mls an hour. Denies any chest pain or SOB at time of transfer.

## 2024-04-02 NOTE — Progress Notes (Signed)
 Pharmacy Admission Medication HistoryMedication list reviewer: [name], CPhT Medication History TechnicianAfter reviewing the current home medication list, writer has identified the following:Added:Azathioprine tablet: 'take 50 mg PO 3 times daily' Note: med list referenced during Med History interview states the frequency for Azathioprine is 2 times daily. Rheumatologist INCREASED Azathioprine frequency to '3 times daily' on 01/05/24.CBD OTC: 'take 25 mg PO 2 times daily'Eye-Vites capsule: 'take 4 capsule PO daily', takes Hydro-Eye soft gels.Nasacort nasal spray: 'spray 1 spray int each nostril daily as needed' Omega 3  PO: 'take 1 capsule PO daily' NON-FORMULARY: 'T3 15 mcg SR compounded capsule: take 1 capsule (15 mcg total) by mouth every morning' per PCP med list, [facility] dispense history (12/29/23, 90 day supply), and [name].NON-FORMULARY: 'Naltrexone 6 mg with Ginger Root compounded capsule: take 1 capsule by mouth 2 times daily' per PCP med list and [facility] dispense history (12/29/23, 90 day supply).Note: med referenced during Med History interview states Naltrexone frequency as 'take 1 capsule PO daily'. Was advised to continue Naltrexone twice daily by Rheumatology on 10/10/23.Vevye 0.1% Ophth solution: 'place 1 drop into both eyes every 12 hours'Removed (medication not currently prescribed):Advair 100-50 mcg inhaler (no directions listed), only prescribed the 250-50 mcg formulation.Digestive Enzymes PO ('take 1 capsule PO daily') Medrol 4 mg tablet (no directions listed) Mycophenolate tablet ('take 1000 mg PO 2 times daily'). Discontinued by PCP 10/10/23.Omeprazole capsule ('take 40 mg PO daily'), only prescribed the 20 mg formulation.Dose/form/route/frequency/indication clarified and adjusted:Add missing directions to Vitamin D3 tablet: 'take 5000 units PO daily' Add missing directions to Vitamin B12 tablet: 'take 5000 mcg PO  daily' Change NON-FORMULARY Estriol-DHEA formulation to 2-20mg /ml cream, and change directions to: 'place 1 g vaginally daily' Change Gabapentin  to: 'take 300 mg PO 4 times daily' Change Magnesium tablet to: 'take 500 mg PO 3 times daily' Add missing directions to Multivitamin tablet: 'take 1 tablet PO daily' Add missing directions to Tizanidine tablet: 'take 4 mg PO every 8 hours as needed'Additional comments:Pharmacy Tech ([name], CPhT) reviewed this patient's medication history ab:[id number]CP AVS Obtained via Care Everywhere (*see details below)Reviewing patient's chartReviewing outpatient medication dispense historyCalling patient's pharmacies ([facility] in [place], [place], [facility]  [address], [facility] [address], [facility], and [facility] in [place], [place])Interviewing patient's spouse [name] [phone] fill discharge medications at [facility] and deliver bedside per patient's request.Overdue to refill Duloxetine  60 mg capsule: last dispensed 10/31/23 (90 day supply) at [facility]. Otherwise up-to-date on prescription refills per [facility] dispense history.[name] is familiar with all home medication names and directions.Writer reviewed allergies and intolerances on file with [name], and made the following changes: Add NSAID's intolerance (reaction: stomach irritation) per PCP records and [place].Please see separate chart note for PCN allergy review.*Please add missing PCP info into patient's profile:Dr. Eleanor Lawrence at [facility] [phone])[name], CPhTI personally reviewed the home medication list obtained by the Medication History Technician. The home list is updated and inpatient medications have been reviewed.Bernardino Forehand, PharmD, BCGP Transitions of Care Pharmacy [facility] Phone: [phone] Prior to  Admission Medication Sig  albuterol  HFA (PROVENTIL , VENTOLIN ) 108 (90 Base) MCG/ACT inhaler Inhale 1-2 puffs into the lungs every 4-6 hours as needed for Wheezing.  azaTHIOprine (IMURAN) 50 MG tablet Take 1 tablet (50 mg total) by mouth 3 times daily.  Cannabinoids (OTC CBD PRODUCT) Take 25 mg by mouth 2 times daily  cetirizine (ZYRTEC) 10 MG tablet Take 1 tablet (10 mg total) by mouth every morning.  Cholecalciferol (  VITAMIN D-3) 125 MCG (5000 UT) TABS Take 5,000 units by mouth daily.  cycloSPORINE (VEVYE) 0.1 % ophthalmic solution Place 1 drop into both eyes every 12 hours.  DULoxetine  (CYMBALTA ) 60 MG DR capsule Take 1 capsule (60 mg total) by mouth daily.  gabapentin  300 mg capsule Take 1 capsule (300 mg total) by mouth 4 times daily.  levothyroxine  50 mcg tablet Take 1 tablet (50 mcg total) by mouth daily (before breakfast).  magnesium oxide 500 mg tablet Take 1 tablet (500 mg total) by mouth 3 times daily.  montelukast  (SINGULAIR ) 10 mg tablet Take 1 tablet (10 mg total) by mouth daily. (In the afternoon)  multi-vitamin (MULTIVITAMIN ADULT) tablet Take 1 tablet by mouth daily.  Multiple Vitamins-Minerals (EYE-VITES) TABS Take 4 capsules by mouth daily. (HydroEye softgels)  NONFORMULARY, OTHER, ORDER *I* Estriol-DHEA 2-20mg /ml compounded cream: place 1 g vaginally once daily  NONFORMULARY, OTHER, ORDER *I* T3 15 mcg SR compounded capsule: take 1 capsule (15 mcg total) by mouth every morning  NONFORMULARY, OTHER, ORDER *I* Naltrexone 6 mg with Ginger Root compounded capsule: take 1 capsule by mouth 2 times daily  omega-3 fatty acids Take 1 capsule by mouth daily.  omeprazole 20 mg capsule Take 1 capsule (20 mg total) by mouth 2 times daily.  Probiotic Product (PROBIOTIC FORMULA PO) Take 1 capsule by mouth daily  tiZANidine 4 mg tablet Take 1 tablet (4 mg total) by mouth every 8 hours as needed for Muscle spasms.  triamcinolone (NASACORT ALLERGY 24HR) 55 MCG/ACT inhaler  Spray 1 spray into each nostril daily as needed.  valACYclovir  (VALTREX ) 1 gm tablet Take 1 tablet (1,000 mg total) by mouth.  Vitamin B-12 (CYANOCOBALAMIN) 5000 MCG disintegrating tablet Take 1 tablet (5,000 mcg total) by mouth daily.  WIXELA INHUB 250-50 MCG/ACT diskus inhaler Inhale 1 puff into the lungs 2 times daily.

## 2024-04-02 NOTE — Progress Notes (Signed)
 04/01/24 1108 [facility] Patient Class Review Patient Class Review Inpatient

## 2024-04-02 NOTE — Progress Notes (Signed)
 Report Given Cy, RN Procedure SummaryProcedure: Coronary Angiography; LHC with LV GramBaseline Neuro Status: Awake, alert, and oriented x3.  Moving all extremities equally, speech clear.Intervention/Results: See cardiology note.Access:  73F RRAHemostasis Type and Time: TR band applied to Rt wrist. Hemostasis achieved. No hematoma noted. Good pleth waveform present. Sedation Type and Amt: 2 mg versed, 25 mcg fentanylAdditional Meds in Procedure: 5000 units heparin,  51 ml dyeActive Issues/Relevant EventsPost Procedure Neuro Status: Same as baselineIn Lab Complications: NoneConcerns: NonePost Procedure Hydration: N/A; LVEDP of Do ListRemove TR band start at 1910 Call [facility] to give report Monitor R. Arm for bleeding/hematoma Anticipatory Guidance/Discharge PlanningTransport [facility] 2030Sarah Randine, RN

## 2024-04-02 NOTE — Progress Notes (Signed)
 [facility] Progress NoteDate of Consult: 11/3/2025Name: [name] Attending: Angelica Isles, MD DOB: [birth year] PCP: Unknown, Provider, MD EMRN: [id number] Primary Cardiologist: None  Admit Date:  04/01/2024 Subjective I had the pleasure of seeing [name] in cardiology followup on 04/02/2024.  Her past medical history is significant for depression, GERD, IBS, migraines, breast cancer, muscular dystrophy. Today patient was seen during her echocardiogram. She states that she is feeling much better today than when she came in. She states that after she came into the hospital, the chest pain did not fully subside until ~630 am yesterday. Patient reports paternal grandfather and her brother has had heart problems with her grandfather's cause of death being a heart attack. She has not noticed any new swelling in her legs today but does note when she is active they do swell. She currently denies difficulty breathing, n/v, constipation and diarrhea. Interval Hx: Past few days noting cough, congestion, body aches, headache.3 AM Yesterday morning, she woke feeling sharp chest pain and shortness of breath.  Symptoms lasted about 30 minutes.  The pain initially radiated to her back.  Of note, her husband recently tested positive for COVID. not had any difficulty with cardiac symptoms doing her chair exercises recently. her first troponin was 92, second 3-hour troponin was 84. CT was negative for acute PE. Past Medical History: Diagnosis Date  Acne   Actinic keratosis   Allergy history unknown   Anemia   Asthma 05/10/2019  Back pain   Resolved  Complication of anesthesia   Novacaine Dizzy  Depression   Dermatitis 02/09/2012  Dermatitis   Diverticulosis   Essential hypertension 01/09/2024  GERD (gastroesophageal reflux disease)   Gout   Herpes   Genital  IBS (irritable bowel syndrome)   Inflammatory arthritis  03/07/2017  Lumbar stenosis   Migraine   Neuropathy   Pulmonary fibrosis   Recurrent sinus infections   STD (sexually transmitted disease)   Gential Herpes  Urticaria   Varicella  Past Surgical History: Procedure Laterality Date  ANKLE SURGERY Left 11/10/2022  cyst removed from behind a bone  CESAREAN SECTION, CLASSIC  06/01/1983  ENDOMETRIAL ABLATION  06/01/2007  INCONTINENCE SURGERY  05/31/2008  NASAL POLYP SURGERY  05/31/2001  RECTOCELE REPAIR  05/31/2008  SPHINCTEROPLASTY  05/31/2008  ABSCESS AFTER SURGERY Medications Current Facility-Administered Medications Medication Dose Route Frequency  senna  2 tablet Oral Nightly  cyanocobalamin  2,000 mcg Oral Daily  cholecalciferol  1,000 units Oral Daily  levothyroxine   50 mcg Oral Daily @ 0600  DULoxetine   60 mg Oral Daily  arformoterol  15 mcg Nebulization 2 times per day  And  budesonide  0.25 mg Nebulization 2 times per day  gabapentin   300 mg Oral 4x Daily  montelukast   10 mg Oral Daily  pantoprazole  40 mg Oral BID AC  tiZANidine  2 mg Oral TID  liothyronine   15 mcg Oral Daily  carvedilol  3.125 mg Oral 2 times per day  azaTHIOprine  50 mg Oral TID  cycloSPORINE  1 drop Both Eyes 2 times per day  cetirizine  10 mg Oral Daily  aspirin  81 mg Oral Daily  folic acid  1 mg Oral Daily Vitals and Physical Exam [name]'s  height is 1.651 m (5' 5) and weight is 65.8 kg (145 lb). Her temporal temperature is 35.3 C (95.5 F). Her blood pressure is 124/75 and her pulse is 63. Her respiration is 16 and oxygen saturation is 96%.  Body mass index is 24.13  kg/m.I/O last 3 completed shifts:11/02 0700 - 11/03 0659In: 162.4 (2.5 mL/kg) [I.V.:162.4 (0.1 mL/kg/hr)]Out: - (0 mL/kg) Net: 162.4Weight: 65.8 kg Objective:General Appearance:  Comfortable and in no acute distress.  Vital signs: (most recent): Blood pressure 125/84, pulse 63, temperature 36.2 C (97.1  F), temperature source Oral, resp. rate 18, height 1.651 m (5' 5), weight 65.8 kg (145 lb), SpO2 95%.  Lungs:  Normal effort.  Breath sounds clear to auscultation.  Heart: Normal rate.  No murmur. Abdomen: Abdomen is soft.  There is generalized tenderness.   Extremities: Normal range of motion.  There is no effusion or local swelling.  Neurological: Patient is alert and oriented to person, place and time.  Skin:  Warm and dry.  Laboratory Data Hematology: Results in Past 730 DaysResult Component Current Result Previous Result WBC 3.7 (04/02/2024) 4.8 (04/01/2024) Hemoglobin 12.6 (04/02/2024) 14.2 (04/01/2024) Hematocrit 38 (04/02/2024) 44 (04/01/2024) Platelets 226 (04/02/2024) 239 (04/01/2024) Chemistry: Results in Past 730 DaysResult Component Current Result Previous Result Sodium 140 (04/02/2024) 138 (04/01/2024) Potassium 4.1 (04/02/2024) 4.5 (04/01/2024) Creatinine 0.53 (04/02/2024) 0.54 (04/01/2024) Glucose 99 (04/02/2024) 97 (04/01/2024) Calcium 9.0 (04/02/2024) 9.7 (04/01/2024) Magnesium 2.0 (04/02/2024) 2.2 (04/01/2024) AST 63 (H) (04/02/2024) 80 (H) (04/01/2024) ALT 67 (H) (04/02/2024) 85 (H) (04/01/2024) TSH 2.47 (04/01/2024) Not in Time Range Coagulation Studies: Results in Past 730 DaysResult Component Current Result Previous Result aPTT 41.7 (H) (04/01/2024) Not in Time Range Cardiac: Results in Past 730 DaysResult Component Current Result Previous Result TROP T RANDOM High Sensitivity 84 (HH) (04/01/2024) Not in Time Range TROP T 0 HR High Sensitivity 92 (HH) (04/01/2024) Not in Time Range TROP T 1 HR High Sensitivity CANCELED (04/01/2024) Not in Time Range TROP T 3 HR High Sensitivity 92 (HH) (04/01/2024) Not in Time Range CK 448 (H) (04/01/2024) Not in Time Range NT-pro BNP 67 (04/01/2024) Not in Time Range Lipids: Results in Past 730 DaysResult Component Current Result Previous Result Cholesterol 193 (04/01/2024) Not in Time Range  HDL 78 (H) (04/01/2024) Not in Time Range Triglycerides 46 (04/01/2024) Not in Time Range LDL Calculated 106 (04/01/2024) Not in Time Range Chol/HDL Ratio 2.5 (04/01/2024) Not in Time Range Cardiac/Imaging Data & Risk Scores ECG/Telemetry: 04/01/24 - Normal sinus rhythm with first-degree AV block. No acute ST-T wave changes Echo 04/02/24:Normal LV size and systolic function with focal PDA/LCx wall motion abnormality. Calculated LVEF is 62%.Mild aortic and trace mitral valve regurgitation. Mild tricuspid valve regurgitation.Normal right ventricular size and function with estimated normal systolic PA pressure.Mild aortic root and ascending aortic enlargement.No prior report for comparison.  FRAIL Scoring  Impression and Plan Active Problems:  NSTEMI (non-ST elevated myocardial infarction) This is an 68 y.o. female with with depression, GERD, IBS, migraines, breast cancer.  Presented with chest pain, shortness of breath.  Found to have elevated, downtrending troponins.  CT negative for acute PE.  First troponin was 92, 3-hour troponin was 84.  She is currently chest pain-free.  Etiology of her chest pain is unclear.  She was recently diagnosed with borderline hypertension, but her blood pressures have been fairly normal here.  She has no other strong cardiovascular risk factors.  Her EKG is reassuringly without acute ischemic changes.  While atherosclerotic coronary disease remains a possibility, other possibilities include Takotsubo's cardiomyopathy, pericarditis, coronary vasospasm, coronary dissection.  Reassuringly, she is currently without cardiac symptoms at present.NSTEMI: With elevated troponin, chest pain symptoms, would consider ACS.- Continue heparin drip for ACS- Continue carvedilol 3.125 mg twice daily- Continue 81 mg daily- Can use nitro as  needed for recurrent chest pain, but she is currently without symptoms- If  symptoms recur, please obtain EKG acutely- Ongoing infectious testing pending- Echo completed with possible minimal wall motion abnormality - Keep NPO for possible angio - Scheduled to be done today, Patient signed transfer paperwork - [facility] procedureHypertension: Continue carvedilol 3.125 mg twice daily, as noted above.[facility] HC HEART FAILURE QUALITY INDICATORS:LVEF by ECHO volume calculation: No results found for requested labs within last 365 days.LVEF by ECHO visual estimation: No results found for requested labs within last 365 days.Heart Failure Assessment:Immunization/Vaccination  Theodora Immunization Record  Theodora Lino Donovan, DOElectronically signed on 04/02/2024 at 8:16 AM

## 2024-04-02 NOTE — Progress Notes (Signed)
 Report Given [facility], [facility] (phone report)Procedure SummaryProcedure: Coronary Angio; LHC with LV GramBaseline Neuro Status: Awake, alert, and oriented x3.  Moving all extremities equally, speech clear.Intervention/Results: See cardiology note.Access:  29F RRAHemostasis Type and Time: TR band applied to Rt wrist. Hemostasis achieved. No hematoma noted. Good pleth waveform present. Sedation Type and Amt: 2 mg versed, 25 mcg fentanylAdditional Meds in Procedure: 5000 units heparin,  51 ml dyeActive Issues/Relevant EventsPost Procedure Neuro Status: Same as baselineIn Lab Complications: NoneConcerns: NonePost Procedure Hydration: N/A; LVEDP of - Ambulance called for 2130- [facility] RN given report via phone @1915 - TR band off @2130  (gauze&tage CDI)To Do ListMonitor R. Arm for bleeding/hematoma Anticipatory Guidance/Discharge PlanningPt is A&O x4 and ambulatory independentlyTransport back [facility]

## 2024-04-02 NOTE — Progress Notes (Signed)
 Report Given Sarah, RN Procedure SummaryProcedure: Coronary Angiography; LHC with LV GramBaseline Neuro Status: Awake, alert, and oriented x3.  Moving all extremities equally, speech clear.Intervention/Results: See cardiology note.Access:  34F RRAHemostasis Type and Time: TR band applied to Rt wrist. Hemostasis achieved. No hematoma noted. Good pleth waveform present. Sedation Type and Amt: 2 mg versed, 25 mcg fentanylAdditional Meds in Procedure: 5000 units heparin,  51 ml dyeActive Issues/Relevant EventsPost Procedure Neuro Status: Same as baselineIn Lab Complications: NoneConcerns: NonePost Procedure Hydration: N/A; LVEDP of Do ListPost procedure care as per orders. Anticipatory Guidance/Discharge PlanningRecover in CCL holding and [facility].

## 2024-04-02 NOTE — Progress Notes (Signed)
 Physical Therapy PT Adult Assessment - 04/02/24 1349    PT Tracking  PT TRACKING PT Eval Attempted    Treatment Day  Additional Comments Pt scheduled to go to [facility] for angiogram this afternoon.  Will hold PT evaluation this pm and continue to follow to complete evaluation as appropriate.    Chaddrick Brue, PTPlease secure chat to communicate if needed.

## 2024-04-02 NOTE — Progress Notes (Signed)
 Assumed pt care. Patient arrived to recovery area s/p LHC. Pt is A&Ox4 and VSS post procedure. Currently denying any pain or discomfort. RRA site CDI. Plan for transferring back to [facility] hospital. Previous RN called ambulance for 2030. Family updated regarding discharge plan and at bedside.  Cy Cramp, RN

## 2024-04-03 ENCOUNTER — Other Ambulatory Visit: Payer: Self-pay

## 2024-04-03 DIAGNOSIS — I249 Acute ischemic heart disease, unspecified: Secondary | ICD-10-CM

## 2024-04-03 LAB — BASIC METABOLIC PANEL
Anion Gap: 10 (ref 7–16)
CO2: 23 mmol/L (ref 20–28)
Calcium: 9.4 mg/dL (ref 8.6–10.2)
Chloride: 105 mmol/L (ref 96–108)
Creatinine: 0.52 mg/dL (ref 0.51–0.95)
Glucose: 85 mg/dL (ref 60–99)
Lab: 9 mg/dL (ref 6–20)
Potassium: 4 mmol/L (ref 3.3–5.1)
Sodium: 138 mmol/L (ref 133–145)
eGFR BY CREAT: 101

## 2024-04-03 LAB — EKG 12-LEAD
P: 53 deg
PR: 264 ms
QRS: 33 deg
QRSD: 88 ms
QT: 426 ms
QTc: 424 ms
Rate: 59 {beats}/min
T: 24 deg

## 2024-04-03 LAB — MAGNESIUM: Magnesium: 1.8 mg/dL (ref 1.6–2.5)

## 2024-04-03 MED ORDER — FOLIC ACID 1 MG PO TABS *I*
1.0000 mg | ORAL_TABLET | Freq: Every day | ORAL | 0 refills | Status: AC
Start: 2024-04-04 — End: ?
  Filled 2024-04-03: qty 30, 30d supply, fill #0

## 2024-04-03 MED ORDER — ASPIRIN 81 MG PO CHEW *I*
81.0000 mg | CHEWABLE_TABLET | Freq: Every day | ORAL | 0 refills | Status: AC
Start: 2024-04-04 — End: ?
  Filled 2024-04-03: qty 30, 30d supply, fill #0

## 2024-04-03 MED ORDER — CARVEDILOL 3.125 MG PO TABS *I*
3.1250 mg | ORAL_TABLET | Freq: Two times a day (BID) | ORAL | 0 refills | Status: AC
Start: 2024-04-03 — End: 2024-05-03
  Filled 2024-04-03: qty 60, 30d supply, fill #0

## 2024-04-03 NOTE — Progress Notes (Signed)
 [facility] Card[facility] Progress NoteDate of Consult: 11/4/2025Name: Elida Beans Attending: Angelica Isles, MD DOB: 11-22-1955 PCP: Charleen Setter, NP EMRN: [id number] Primary Cardiologist: None  Admit Date:  04/01/2024 Subjective I had the pleasure of seeing Ariaunna Longsworth in cardiology followup on 04/03/2024. Patient resting comfortably in recliner. Patient reports feeling well. She is not in any acute distress.She denies chest pain and difficulty breathing. Her questions were answered to the best we could. Reports that she lives in [facility] and will be seeing her PCP  there next week - told her she should also have cardiology follow up if possible. She was informed about continuing new cardioprotective meds in the short term. Past Medical History: Diagnosis Date  Acne   Actinic keratosis   Allergy history unknown   Anemia   Asthma 05/10/2019  Back pain   Resolved  Complication of anesthesia   Novacaine Dizzy  Depression   Dermatitis 02/09/2012  Dermatitis   Diverticulosis   Essential hypertension 01/09/2024  GERD (gastroesophageal reflux disease)   Gout   Herpes   Genital  IBS (irritable bowel syndrome)   Inflammatory arthritis 03/07/2017  Lumbar stenosis   Migraine   Neuropathy   Pulmonary fibrosis   Recurrent sinus infections   STD (sexually transmitted disease)   Gential Herpes  Urticaria   Varicella  Past Surgical History: Procedure Laterality Date  ANKLE SURGERY Left 11/10/2022  cyst removed from behind a bone  CESAREAN SECTION, CLASSIC  06/01/1983  ENDOMETRIAL ABLATION  06/01/2007  INCONTINENCE SURGERY  05/31/2008  NASAL POLYP SURGERY  05/31/2001  RECTOCELE REPAIR  05/31/2008  SPHINCTEROPLASTY  05/31/2008  ABSCESS AFTER SURGERY Medications Current Facility-Administered Medications Medication Dose Route Frequency  senna  2 tablet Oral Nightly  cyanocobalamin  2,000  mcg Oral Daily  cholecalciferol  1,000 units Oral Daily  levothyroxine   50 mcg Oral Daily @ 0600  DULoxetine   60 mg Oral Daily  arformoterol  15 mcg Nebulization 2 times per day  And  budesonide  0.25 mg Nebulization 2 times per day  gabapentin   300 mg Oral 4x Daily  montelukast   10 mg Oral Daily  pantoprazole  40 mg Oral BID AC  tiZANidine  2 mg Oral TID  liothyronine   15 mcg Oral Daily  carvedilol  3.125 mg Oral 2 times per day  azaTHIOprine  50 mg Oral TID  cycloSPORINE  1 drop Both Eyes 2 times per day  cetirizine  10 mg Oral Daily  aspirin  81 mg Oral Daily  folic acid  1 mg Oral Daily Vitals and Physical Exam Azuree's  height is 1.651 m (5' 5) and weight is 65.8 kg (145 lb). Her temporal temperature is 36.1 C (97 F). Her blood pressure is 143/79 and her pulse is 80. Her respiration is 16 and oxygen saturation is 95%.  Body mass index is 24.13 kg/m.I/O last 3 completed shifts:11/03 0700 - 11/04 0659In: 371.6 (5.6 mL/kg) [P.O.:350; I.V.:21.6 (0 mL/kg/hr)]Out: 5 (0.1 mL/kg) [Blood:5]Net: 366.6Weight: 65.8 kg Objective:General Appearance:  Comfortable, well-appearing, in no acute distress and not in pain.  Vital signs: (most recent): Blood pressure 138/75, pulse 77, temperature 36.7 C (98.1 F), temperature source Oral, resp. rate 16, height 1.651 m (5' 5), weight 65.8 kg (145 lb), SpO2 95%.  Vital signs are normal.  No fever.  Lungs:  Normal effort and normal respiratory rate.  Breath sounds clear to auscultation.  She is not in respiratory distress.  Heart: Normal rate.  Regular rhythm.  No murmur. Abdomen: Abdomen is soft.  There is no abdominal tenderness.   Extremities: Normal range of motion.  Pulses: (Radial pulse intact bilaterally. R hand with <2 sec capillary refill. No hematoma seen on R Wrist)  Neurological: Patient is alert and oriented to person, place and time.  Skin:  Warm and dry.  Laboratory Data  Hematology: Results in Past 730 DaysResult Component Current Result Previous Result WBC 3.7 (04/02/2024) 4.8 (04/01/2024) Hemoglobin 12.6 (04/02/2024) 14.2 (04/01/2024) Hematocrit 38 (04/02/2024) 44 (04/01/2024) Platelets 226 (04/02/2024) 239 (04/01/2024) Chemistry: Results in Past 730 DaysResult Component Current Result Previous Result Sodium 140 (04/02/2024) 138 (04/01/2024) Potassium 4.1 (04/02/2024) 4.5 (04/01/2024) Creatinine 0.53 (04/02/2024) 0.54 (04/01/2024) Glucose 99 (04/02/2024) 97 (04/01/2024) Calcium 9.0 (04/02/2024) 9.7 (04/01/2024) Magnesium 2.0 (04/02/2024) 2.2 (04/01/2024) Hemoglobin A1C 5.5 (04/01/2024) Not in Time Range AST 63 (H) (04/02/2024) 80 (H) (04/01/2024) ALT 67 (H) (04/02/2024) 85 (H) (04/01/2024) TSH 2.47 (04/01/2024) Not in Time Range Coagulation Studies: Results in Past 730 DaysResult Component Current Result Previous Result aPTT 41.7 (H) (04/01/2024) Not in Time Range Cardiac: Results in Past 730 DaysResult Component Current Result Previous Result TROP T RANDOM High Sensitivity 84 (HH) (04/01/2024) Not in Time Range TROP T 0 HR High Sensitivity 92 (HH) (04/01/2024) Not in Time Range TROP T 1 HR High Sensitivity CANCELED (04/01/2024) Not in Time Range TROP T 3 HR High Sensitivity 92 (HH) (04/01/2024) Not in Time Range CK 448 (H) (04/01/2024) Not in Time Range NT-pro BNP 67 (04/01/2024) Not in Time Range Lipids: Results in Past 730 DaysResult Component Current Result Previous Result Cholesterol 193 (04/01/2024) Not in Time Range HDL 78 (H) (04/01/2024) Not in Time Range Triglycerides 46 (04/01/2024) Not in Time Range LDL Calculated 106 (04/01/2024) Not in Time Range Chol/HDL Ratio 2.5 (04/01/2024) Not in Time Range Cardiac/Imaging Data & Risk Scores ECG/Telemetry: Sinus rhythm, Normal rate ECHO COMPLETE 11/03/2025Interpretation SummaryNormal LV size and systolic function with focal PDA/LCx wall motion  abnormality. Calculated LVEF is 62%.Mild aortic and trace mitral valve regurgitation.Mild tricuspid valve regurgitation.Normal right ventricular size and function with estimated normal systolic PA pressure.Mild aortic root and ascending aortic enlargement.No prior report for comparison.Fonda Juliene Harts, MD CARDIAC CATHETERIZATION 11/03/2025Conclusion1. Angiographically normal coronary arteries.2. Normal left ventricular systolic function. Mildly elevated left ventricular end diastolic pressure.I provided moderate sedation. During this time, I was face to face with the patient and supervising the RN who monitored the patient's level of consciousness and physiologic status.FRAIL Scoring  Impression and Plan Principal Problem:  NSTEMI (non-ST elevated myocardial infarction)Active Problems:  Viral URI  Exposure to COVID-19 virus This is an 68 y.o. female with depression, GERD, IBS, migraines, breast cancer. Presented with chest pain, shortness of breath. Found to have elevated, downtrending troponins. CT negative for acute PE. First troponin was 92, 3-hour troponin was 84. She is currently chest pain-free. Etiology of her chest pain is unclear. She was recently diagnosed with borderline hypertension, but her blood pressures have been fairly normal here. She has no other strong cardiovascular risk factors. Her EKG is reassuringly without acute ischemic changes. Coronary angio done 11/3 did not show blockage. Patient possibly had a heart attack that occurs when arteries do not show significant blockages on a coronary angiogram. Possible causes include coronary vasospasm, coronary microvascular dysfunction, plaque erosion, or spontaneous coronary artery dissection. It is more common in women with symptoms  similar to other heart attacks, but women may also experience symptoms like fatigue or a general feeling of being unwell.  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: - Stop heparin  drip for ACS- Continue cardioprotective medications for short term on discharge- Carvedilol 3.125 mg twice daily - will need new prescription - Asprin 81 mg daily - If symptoms recur, please obtain EKG acutely- Echo completed with possible minimal wall motion abnormality - Coronary Angiogram normal - no blockage  - Cleared from cardiology standpoint- Will need cardiology follow up - pt reports living in [facility] and going back soon - she reports having PCP there and will find cardiologist for f/u - our clinic also open if patient needs usHypertension: Continue carvedilol 3.125 mg twice daily, as noted above. [facility] HC HEART FAILURE QUALITY INDICATORS:LVEF by ECHO volume calculation: 04/02/2024: LVEF (Volume) 62 % (Ref range: %)LVEF by ECHO visual estimation: No results found for requested labs within last 365 days.Heart Failure Assessment:Immunization/Vaccination  Theodora Immunization Record  Theodora Lino Donovan, DOElectronically signed on 04/03/2024 at 8:15 AM

## 2024-04-03 NOTE — Discharge Instructions (Addendum)
 Brief Summary of Your Hospital Course (including key procedures and diagnostic test results):You were seen in the hospital for your chest pain and the cardiologists determined there was no blockage in the arteries in your heart. We think this was likely related to your viral infection.  Please see your primary care doctor in [place]  and see if they recommend a cardiology doctor there.Your instructions:Please take carvedilol and aspirin for 1 month, and follow up with your PCP in [place] . New Medications:Carvedilol 3.125mg  twice daily for 1 month, aspirin 81mg  daily for 1 month, start folate supplement daily Changes to existing medications:NoneRecommended diet: Regular - No restrictions Recommended activity: activity as toleratedIf you experience any of these symptoms within the first 24 hours after discharge:Pain, Chest pain, Shortness of breath, Fever of 101 F. or greater, Chills, Poor feeding, Poor urinary output, Vomiting, Nausea, Diarrhea, Blood in stool, Loss of consciousness, or New or worsening headacheplease follow up with the discharge attending Dr. Eligio Frohlich at phone-number: [phone] you experience any of these symptoms 24 hours or more after discharge:Pain, Chest pain, Shortness of breath, Fever of 101 F. or greater, Chills, Poor feeding, Poor urinary output, Vomiting, Nausea, Diarrhea, Blood in stool, or Loss of consciousnessplease follow up with your PCP:  Charleen Setter, NP [phone]

## 2024-04-03 NOTE — Progress Notes (Signed)
 Hospital Medicine Progress NoteLOS: 2        Full Code Overnight Events/Subjective Left heart cath completed yesterday, normal coronary arteries. Pt reports she has not had any new chest pain overnight or currently. Feels a little better overall. She is ready to go home and back to [place] . Objective  Objective BP: (117-161)/(67-108) Temp:  [36 C (96.8 F)-36.7 C (98.1 F)] Temp src: Oral (11/04 1221)Heart Rate:  [59-95] Resp:  [10-23] SpO2:  [92 %-99 %]  Physical Exam:Constitutional: NADEyes:  normal EOMs, conjunctiva normal, no scleral icterusEars, Nose, Mouth and Throat: moist mucous membranesCardiovascular: RRR, no murmurs, rubs or gallops; no lower extremity edema Respiratory: lungs CTA bilaterally; no wheezing, rales or rhonchi Gastrointestinal: soft, non-distended, nontenderMusculoskeletal: ambulates with rolling walkerSkin: warm and dryNeurological: A&Ox3; no focal deficitPsychiatric: normal affectLab, Microbiology, Cardiac & Imaging Studies:Personally reviewedIntake/Output Summary (Last 24 hours) at 04/03/2024 1515Last data filed at 04/03/2024 0859Gross per 24 hour Intake 590 ml Output 5 ml Net 585 ml     Assessment [name] is a  68 y.o. 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 Elevated adynamic troponins. Echo today preserved systolic function, but focal mid PDA/LCx regional wall motion abnormality, so cardiology took pt to [facility] for coronary angiogram which revealed normal coronary arteries. Most likely her chest pain was secondary to viral myocarditis. Pt is med ready, cardiology recommended  sending her home with coreg and aspirin for one month with follow up outpatient. Likely will need repatha outpatient, pt has history of myopathy so a statin would not be recommended.  Plan #Elevated Troponins#Chest pain #Possible NSTEMI/UA, Troponins elevated but adynamic (07,15,07),  patient is currently stable and without chest pain. EKG NSR without ischemic changes. Possibly secondary to viral illness with infectious symptoms. CT angio chest negative for PE. S/p aspirin load 324mg  - Continuous telemetry-TSH normal, lipid panel LDL 106- Daily CBC/BMP/Mag - maintain K >4 and Mg >2- Continue coreg 3.125mg  BID- Stop heparin drip- Aspirin 81 mg daily- If symptoms recur, please obtain EKG acutely- Echo preserved systolic function, but focal mid PDA/LCx regional wall motion abnormality, pt transferred to cath lab at [facility], revealing normal coronary arteries-Cardiology following, recommended to continue coreg BID and aspirin daily for cardioprotection for one month outpatient #Cough, body aches-likely viral illness, symptoms improving-COVID/FLU/RSV neg-extended resp panel negative-CXR no acute findings #HTN- hold home lisinopril 2.5mg  #Transaminitis- Mild elevation in liver enzymes (AST 80, ALT 85), possibly in setting of viral illness. 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: Azathioprine 50 mg TIDHypothyroidism: Levothyroxine  50 mcg daily, T3 15 mcg dailyAsthma/Allergies: Fluticasone -salmeterol, Montelukast  10 mg daily, zyrtecGERD: Omeprazole 20 mg BID  Med Rec Completed: yes  Medications: DVT:  enoxaparin (LOVENOX) injection 40 mg/0.4 mLBowel: polyethylene glycol  (MIRALAX) 3350 17gm PO,senna (SENOKOT) 8.6 mg tablet (last BM  None)Pain: tylenol  PRN Maintenance: Lines/Tubes: pIV  Fluids: PO Diet: Diet regular Disposition: PT/OT/SLP: PT clearedDischarge to: HomeBarriers to Discharge: noneMRDD: 04/03/2024  Asberry Breaker, MD 04/03/2024 at 3:15 PMMeds & Labs: Medications Scheduled Meds: senna  2 tablet Oral Nightly  cyanocobalamin  2,000 mcg Oral Daily  cholecalciferol  1,000 units Oral Daily  levothyroxine   50 mcg Oral Daily @ 0600  DULoxetine   60 mg Oral Daily  arformoterol  15 mcg Nebulization 2 times per day  And  budesonide  0.25 mg Nebulization 2 times per day  gabapentin   300 mg Oral 4x Daily  montelukast   10 mg Oral Daily  pantoprazole  40 mg Oral BID AC  tiZANidine  2 mg Oral TID  liothyronine   15 mcg Oral Daily  carvedilol  3.125 mg Oral 2 times per day  azaTHIOprine  50 mg Oral TID  cycloSPORINE  1 drop Both Eyes 2 times per day  cetirizine  10 mg Oral Daily  aspirin  81 mg Oral Daily  folic acid  1 mg Oral Daily Continuous Infusions:PRN Meds:.sodium chloride, dextrose, acetaminophen , polyethylene glycol, melatoninLab Frequency Next Occurrence EKG: follow up PRN  Activity as tolerated PRN  EKG 12 lead PRN  Basic metabolic panel DAILY  CBC and differential daily x 3 days DAILY  Magnesium DAILY  Unfrac heparin, anti XA PRN  COVID-19 NAAT (PCR) ONE TIME  COVID-19 NAAT (PCR) ONE TIME

## 2024-04-03 NOTE — Discharge Summary (Signed)
 Name: [name] FMW:[id number], DOB:Jan 16, 1957Admit date: 11/2/2025Date of discharge: 11/04/25Discharged to: HomeDischarge attending: Angelica Isles, MDHospitalization Summary:[name] is a 68 y.o. 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  Troponins adynamic 92-> 92, COVID testing negative with only mild and resolving URI symptoms.  Echocardiogram LVEF 62%, but with PDA/LCx wall motion abnormalities for which she underwent a cardiac catheterization on 11/3 finding nonobstructive coronary artery disease.  Cardiology suspects mild viral myocarditis and wishes to continue empiric aspirin/Coreg for 30 days and follow-up based on symptom resolution.  She has been without chest pain since day of admission and stable for discharge, reporting she will go back to [place]  in the next 2 days.  We requested her to bring her discharge paperwork to her physicians in [place]  for review and upload.New problems this hospitalization:Nonobstructive coronary artery disease, NSTEMIPossible viral myocarditis, chest pain, improvingPDA/LCx wall motion abnormalitiesPost-Hospitalization To-Do/Follow up for ERE:[id number] chest pain episodes resolveCan consider cardiology referral for wall motion abnormalities/repeat echocardiogram in futurePost-Hospitalization Labs/Workup pending on discharge:NoneFollow up labs to be ordered by PCP:BMP, CBC, ESR, CRPNew medications at discharge:Aspirin 81 mg x 30 daysCoreg 3.125 mg BID x 30 daysFolic acid 1 mg Home medication changes at discharge with  rationale:NoneImportant radiology findings and follow up:*Chest standard frontal and lateral viewsResult Date: 11/2/2025No acute findings. END OF IMPRESSION CT angio chestResult Date: 11/2/2025The examination is diagnostic for evaluation of the pulmonary arteries. The exam is negative for pulmonary embolism. Suspected focal area of bronchial dilatation and bronchial wall thickening within the superior segment of the right lower lobe. END OF IMPRESSION I have personally reviewed the images and the Resident's/Fellow's interpretation and agree with or edited the findings. Follow up appointments:Future Appointments    Apr 09 2024 02:00 PM - Walker Sora    Apr 10 2024 10:00 AM - FOLLOW UP [facility] [place], [place]     Apr 10 2024 01:00 PM - Lenita Setter    Jun 07 2024 02:00 PM - Walker Sora    Jul 06 2024 01:30 PM - Unknown      Displaying the next 5 appointments. This patient has additional appointments scheduled.  Cordella Jungling, MD11/04/25 2:30 PM

## 2024-04-03 NOTE — Progress Notes (Addendum)
 Physical TherapyInitial Eval Completed.  Patient is a 68 y.o.female who presents with NSTEMI, s/p coronary angiography performed on 11/3. Pt being seen for PT EVAL on 11/4.Is another physical therapy session necessary prior to hospital discharge:  YesDischarge recommendation:  Anticipate return to prior living arrangement, Intermittent supervision/assistEquipment recommendations upon discharge: To be determined  Mobility recommendations for nursing while in hospital: 1A with RW but assist with guiding RW as pt instructed not to use RUE by Strong staffPast Medical History: Diagnosis Date  Acne   Actinic keratosis   Allergy history unknown   Anemia   Asthma 05/10/2019  Back pain   Resolved  Complication of anesthesia   Novacaine Dizzy  Depression   Dermatitis 02/09/2012  Dermatitis   Diverticulosis   Essential hypertension 01/09/2024  GERD (gastroesophageal reflux disease)   Gout   Herpes   Genital  IBS (irritable bowel syndrome)   Inflammatory arthritis 03/07/2017  Lumbar stenosis   Migraine   Neuropathy   Pulmonary fibrosis   Recurrent sinus infections   STD (sexually transmitted disease)   Gential Herpes  Urticaria   Varicella  Past Surgical History: Procedure Laterality Date  ANKLE SURGERY Left 11/10/2022  cyst removed from behind a bone  CESAREAN SECTION, CLASSIC  06/01/1983  ENDOMETRIAL ABLATION  06/01/2007  INCONTINENCE SURGERY  05/31/2008  NASAL POLYP SURGERY  05/31/2001  RECTOCELE REPAIR  05/31/2008  SPHINCTEROPLASTY  05/31/2008  ABSCESS AFTER SURGERY *Bold Indicates co-morbidities affecting treatment and recoveryComorbidities affecting treatment/recovery in addition to those listed above:AnemiaDepressionFibromyalgiaNeuropathyOsteopeniaPersonal factors affecting treatment/recovery:Needs assistance for mobilityClinical presentation:stable Patient complexity:  low level as  indicated by above personal factors, environmental factors and comorbidities in addition to their impairments found on physical exam. PT Adult Assessment - 04/03/24 0857    Prior Living   Prior Living Situation Reported by patient;Obtained via chart   Lives With Spouse   staying at mother's house  Type of Home [facility]   # Steps to Enter Home 1   # Rails to [employer] Home 0   # Of Steps In Home 0   # Rails in Home 0   Location of Bedrooms 1st floor   Location of Bathrooms 1st floor   Bathroom Accessibility Walk-in shower;Accessible   Current Home Equipment Walker (rollator)   Additional Comments Patient lives in [place]  but has been visiting her mother for the past 2 months. Pt's mother's home described above.    Prior Function Level  Prior Function Level Reported by patient;Obtained via chart   Transfers Independent   Transfer Devices rollator walker   Walking Independent;Used assistive device   Walking assistive devices used Rollator walker   Stair negotiation Required assistance   History of Falls No   Receives Help From Independent;Family   Additional Comments Patient states she is Ind for ADLs, ambulates with use of Rollator, has assistance from spouse for stairs, denies any falls within the past 6 months. Patient's spouse can assist once discharged.    PT Tracking  PT TRACKING PT Assigned   Type of Session evaluation   SW Request? No    Treatment Day  Treatment Day (HH / URR) 1    Precautions/Observations  Precautions used Yes   LDA Observation None   Isolation Precautions Other (Comments)   r/o COVID  Was patient wearing a mask? No   PPE worn by clinical research associate Gloves;Gown;Goggles;Mask;N95   Fall Precautions General falls precautions;Patient educated to use call light for nursing assist prior to getting up;White board updated with appropriate mobility status  Current Pain Assessment  Pain Assessment / Reassessment Assessment    Pain Scale 0-10 (Numeric Scale for Pain Intensity)   0-10  Pain Scale 7   Pain Location/Orientation Generalized   Pain Descriptors Aching   (T) Pain Intervention(s) for current pain Repositioned    Vision   Current Vision Adequate for PT session    Cognition  Cognition Tested   Arousal/Alertness Appropriate responses to stimuli   Orientation Alert and oriented x4   Ability to Follow Instructions Follows all commands and directions without difficulty   Additional Comments Patient pleasant and willing to work with PT services. Patient greeted supine in bed, ends session sitting in recliner with BLE elevated, call bell within reach. Patient denies dizziness/lightheadedness/nausea during session.    UE Assessment  Assessment Focus Range of Motion;Strength    LUE (degrees)  Overall ROM AROM WFL    RUE (degrees)  Overall ROM ROM deficits   Procedure 11/3; pt instructed not to use RUE for 2 days, waiting for confirmation from provider   LUE Strength  Overall Strength WFL assessed within functional activities    RUE Strength  Overall Strength Deficits   Procedure 11/3; pt instructed not to use RUE for 2 days, waiting for confirmation from provider   LE Assessment  Assessment Focus Strength;Range of Motion    LLE (degrees)  Overall ROM AROM WFL    RLE (degrees)  Overall ROM AROM WFL    LLE Strength  Overall Strength WFL assessed within functional activities    RLE Strength  Overall Strength WFL assessed within functional activities    Sensation  Additional Comments Light touch to B feet intact    Bed Mobility  Bed mobility Tested   Supine to Sit Stand by assistance;Side rails up (#);Head of bed elevated   Additional comments Cueing to not use RUE for scooting towards edge of bed.    Transfers  Transfers Tested   Sit to Stand Supervision;1 person assist   Stand to sit Supervision;1 person assist   Transfer  Assistive Device rolling walker   Additional comments From edge of bed x1 rep, requires reminder to not use RUE at this time.    Mobility  Mobility Tested   Gait Pattern Decreased cadence;Decreased R step length;Decreased R step height;Decreased L step length;Decreased L step height   Ambulation Assist Stand by;1 person assist   Ambulation Distance (Feet) 16'   Ambulation Assistive Device rolling walker   Additional comments Patient limited with ambulation due to being told not to use RUE after procedure on 11/3. PT helps guide RW but patient exhibits adequate balance with SBA and unilateral UE support at this time. PT waiting for confirmation from provider regarding RUE WBing status before trial step/further ambulation.    Family/Caregiver Training`  Patient/Family/Caregiver training Yes   Patient training Role of physical therapy in hospital and plan for evaluation and follow up;Discharge planning;PT plan of care after evaluation;Call don't fall, purpose of bed/chair alarm, and recommendation for nursing assistance during all oob mobility;Recommendation to increase oob activity and ambulation with nursing while in hospital;Benefits of oob activity and mobility during hospitalization, including frequency of ambulation and change of position    Balance  Balance Tested   Sitting - Static Independent;Unsupported   Sitting - Dynamic Independent;Unsupported   Standing - Static Independent;Supported   Standing - Dynamic Standby assist;Supervision;Supported    PT AM-PAC Mobility  Turning over in bed? None: Modified Independence/independent   Moving from lying on back to sitting on the  side of the bed? A Little: Minimum/Contact Guard Assist/Supervision   Moving to and from a bed to a chair? A Little: Minimum/Contact Guard Assist/Supervision   Sitting down on and standing up from a chair with arms? A Little: Minimum/Contact Guard Assist/Supervision   Need to walk in  hospital room? A Little: Minimum/Contact Guard Assist/Supervision   Climbing 3 - 5 steps with a railing? A Little: Minimum/Contact Guard Assist/Supervision   Total Raw Score 19   Standardized Score - Calculated 45.44   % Functional Impairment - Calculated 42%    Assessment  Brief Assessment Appropriate for skilled therapy   Problem List Impaired balance;Impaired ambulation;Impaired bed mobility;Impaired UE strength   Patient / Family Goal Home today    Plan/Recommendation  PT Treatment Interventions Restorative PT;Assess functional mobility   PT Frequency 3-5x/wk   PT Mobility Recommendations 1A with RW but assist with guiding RW as pt instructed not to use RUE by [employer] staff   PT Referral Recommendations SW   PT Discharge Recommendations Anticipate return to prior living arrangement;Intermittent supervision/assist   PT Discharge Equipment Recommended To be determined   PT Assessment/Recommendations Reviewed With: Patient;Nursing;Social Worker;Care coordinator   Next PT Visit Progress ambulation, trial stairs; confirm LUE WBing status   PT needs to see patient prior to DC  Yes    Time Calculation  PT Timed Codes 0   PT Untimed Codes 21   PT Unbilled Time 0   PT Total Treatment 21    Plan and Onset date  Plan of Care Date 04/03/24   Onset Date 04/01/24   Treatment Start Date 04/03/24    AMPAC Score Interpretation:Adapted from [employer]. Young et al [employer] learning prediction of hospital patient need for post-acute care using admission mobility measure is robust across patient diagnoses. Health Policy and Technology, volume 12, issue 2, June 2023, 100754 Patients 66 years or older with an AM-PAC standardized score of <31 have an increased likelihood of requiring post-acute care (76%)Patients that are less than 80 years old with an AM-PAC standardized score of <31 (41-59%) or, patients that are>49 years old with an AM-PAC standardized  score >/=31 (37-50%) have a moderate likelihood of requiring post-acute care Patients with an AM-PAC standardized score of >43 have an increased likelihood for discharge to home regardless of age. (5-12%)[name] [place], PT,DPTPlease secure chat to communicate if needed.

## 2024-04-03 NOTE — Progress Notes (Signed)
 Physical TherapyTreatment session completed.  PT not a barrier to discharge home.Is another physical therapy session necessary prior to hospital discharge:  NoDischarge recommendation:  Anticipate return to prior living arrangement, Intermittent supervision/assistEquipment recommendations upon discharge: None  Mobility recommendations for nursing while in hospital: Sup with RW PT Adult Assessment - 04/03/24 1413    PT Tracking  PT TRACKING PT Assigned   Type of Session follow up/treatment   SW Request? No    Treatment Day  Treatment Day (HH / URR) 1    Precautions/Observations  Precautions used Yes   LDA Observation None   Isolation Precautions Other (Comments)   COVID exposure quarantine  Was patient wearing a mask? No   PPE worn by clinical research associate Gloves;Gown;Goggles;Mask;N95   Fall Precautions General falls precautions;Patient educated to use call light for nursing assist prior to getting up   Other Covering provider states patient can use RUE for ambulation, advised not to lift anything heavier than a gallon of milk.    Current Pain Assessment  Pain Assessment / Reassessment Assessment   Pain Scale 0-10 (Numeric Scale for Pain Intensity)   0-10  Pain Scale --   does not rate  Pain Location/Orientation Generalized   Pain Descriptors Aching   (T) Pain Intervention(s) for current pain Repositioned    Vision   Current Vision Adequate for PT session    Cognition  Arousal/Alertness Appropriate responses to stimuli   Orientation Alert and oriented x4   Ability to Follow Instructions Follows all commands and directions without difficulty   Additional Comments Patient pleasant and willing to work with PT services. Patient greeted and ends session sitting in recliner with call bell within reach. Patient denies dizziness/lightheadedness/nausea during session.    Bed Mobility  Additional comments Pt denies any concerns with bed  mobility    Transfers  Transfers Tested   Sit to Stand Modified independent (device)   Stand to sit Modified independent (device)   Transfer Assistive Device rollator walker   Additional comments From recliner x1 rep, no LOBs noted.    Mobility  Mobility Tested   Gait Pattern Decreased cadence;Decreased R step length;Decreased R step height;Decreased L step length;Decreased L step height;Decreased stance time L   Ambulation Assist Modified independent (device)   Ambulation Distance (Feet) 60'   Ambulation Assistive Device rollator walker   Stair Management Technique No rail;Step to pattern;Forwards;With walker   Number of Stairs 1   Platform Step Stand by;1person assist   Additional comments Patient demonstrates safe ambulation with use of Rollator, no LOBs noted. Patient able to navigate up/down platform step, but PT lifts Rollator up and off step due to lifting restriction. Patient states her husband usually lifts Rollator on/off step anyway.    Family/Caregiver Training`  Patient/Family/Caregiver training Yes   Patient training Role of physical therapy in hospital and plan for evaluation and follow up;Discharge planning;Call don't fall, purpose of bed/chair alarm, and recommendation for nursing assistance during all oob mobility;Recommendation to increase oob activity and ambulation with nursing while in hospital;Benefits of oob activity and mobility during hospitalization, including frequency of ambulation and change of position    Balance  Balance Tested   Sitting - Static Independent;Unsupported   Sitting - Dynamic Independent;Unsupported   Standing - Static Independent;Supported   Standing - Dynamic Standby assist;Independent;Supported    PT AM-PAC Mobility  Turning over in bed? None: Modified Independence/independent   Moving from lying on back to sitting on the side of the bed? A Little: Minimum/Contact Guard Assist/Supervision  Moving to  and from a bed to a chair? None: Modified Independence/independent   Sitting down on and standing up from a chair with arms? None: Modified Independence/independent   Need to walk in hospital room? None: Modified Independence/independent   Climbing 3 - 5 steps with a railing? A Little: Minimum/Contact Guard Assist/Supervision   Total Raw Score 22   Standardized Score - Calculated 53.28   % Functional Impairment - Calculated 21%    Assessment  Brief Assessment Patient demonstrates adequate mobility skills to return home;Remains appropriate for skilled therapy   Problem List Impaired stair navigation   Patient / Family Goal Home today    Plan/Recommendation  PT Treatment Interventions Restorative PT;Assess functional mobility   PT Frequency 2-4x/wk   Patient clears PT  PT Mobility Recommendations Sup with RW   PT Referral Recommendations SW   PT Discharge Recommendations Anticipate return to prior living arrangement;Intermittent supervision/assist   PT Discharge Equipment Recommended None   PT Assessment/Recommendations Reviewed With: Patient;Nursing;Social Worker;Physician   Next PT Visit Progress mobility as tolerated   PT needs to see patient prior to DC  No    Time Calculation  PT Timed Codes 14   PT Untimed Codes 0   PT Unbilled Time 0   PT Total Treatment 14    Plan and Onset date  Plan of Care Date 04/03/24   Onset Date 04/01/24   Treatment Start Date 04/03/24    AMPAC Score Interpretation:Adapted from [employer]. Young et al [employer] learning prediction of hospital patient need for post-acute care using admission mobility measure is robust across patient diagnoses. Health Policy and Technology, volume 12, issue 2, June 2023, 100754 Patients 66 years or older with an AM-PAC standardized score of <31 have an increased likelihood of requiring post-acute care (76%)Patients that are less than 46 years old with an AM-PAC standardized  score of <31 (41-59%) or, patients that are>63 years old with an AM-PAC standardized score >/=31 (37-50%) have a moderate likelihood of requiring post-acute care Patients with an AM-PAC standardized score of >43 have an increased likelihood for discharge to home regardless of age. (5-12%)Espn Zeman [place], PT,DPTPlease secure chat to communicate if needed.

## 2024-04-03 NOTE — Plan of Care (Signed)
 Problem: SafetyGoal: Patient will remain free of fallsOutcome: Patient discharged Problem: Pain/ComfortGoal: Patient's pain or discomfort is manageableOutcome: Patient discharged Problem: MobilityGoal: Functional status is maintained or improved - GeriatricOutcome: Patient discharged Problem: PsychosocialGoal: Demonstrates ability to cope with illnessOutcome: Patient discharged Problem: Cognitive functionGoal: Cognitive function will be maintained or return to baselineDescription: Interventions:Delirium AssessmentLIVEBAR AssessmentOutcome: Patient dischargedGoal: Lines and tethers will be removed as appropriateOutcome: Patient dischargedGoal: Vital signs will be within normal limitsOutcome: Patient dischargedGoal: Evidence for potential causes of delirium will be managedOutcome: Patient dischargedGoal: Ambulation and mobility will be maintainedOutcome: Patient dischargedGoal: Retention of urine and constipation will be managedOutcome: Patient discharged Problem: Cardiac and Respiratory managementGoal: Evaluate cardiopulmonary functionOutcome: Patient dischargedGoal: Evaluate Cardiac RhythmOutcome: Patient dischargedGoal: No life threatening arrhythmiaOutcome: Patient dischargedGoal: Evaluate Risk factors for bleeding and implement measures toOutcome: Patient discharged Problem: Bleeding PrecautionsGoal: Excessive bleeding will be minimizedOutcome: Patient discharged

## 2024-04-03 NOTE — Progress Notes (Signed)
 [name] said she is doing well, might be discharged today.  [name] said she had an angiogram and other tests yesterday, that she's been told her chest pain was viral rather than heart failure.  [name] said she is very relieved.  [name] mentioned positive support from her husband, mentioned that they live in [facility] and were staying w their mom.  [name] said the hard part is that her husband has been recovering from Covid and so can't visit, understands the necessity but misses being w him.  [name] requested prayer, mentioned she is Catholic.  Chaplain prayed w pt. Chaplains are available upon request 04/03/24 1000 Clinical Encounter Type Visited With Patient Visit Type Referral Encounter Type Spiritual support Referral From Nurse Patient Spiritual Encounters Identified Religion [name] Catholic Spiritual Practices Prayer Patient's Concept of God/Higher Power Positive;Source of strength Spiritual Concern Isolation Patient Coping Grateful;Hopeful/ optimistic Care Provided Chaplain Interventions Reflective listening;Normalized feelings;Explored the meaning of a situation;Helped pt. identify support system;Helped pt. maintain/find hope;Prayer Time Calculation Start Time 1040 Stop Time 1050 Time Calculation 10 Chaplain Consult Complete Chaplain consult complete Yes [place] Late, M.Div, BCCManager of [facility]

## 2024-04-04 ENCOUNTER — Encounter: Payer: Self-pay | Admitting: Cardiology

## 2024-04-04 NOTE — Progress Notes (Signed)
 Pharmacist Discharge Counseling The After Visit Summary Discharge Medication List was reviewed by a Transitions of Care Clinical Pharmacist in an effort to reduce the likelihood of patient readmission due to medication related events. Additional Recommendations or Clarifications: aspirin and carvedilol started for myocardial infarction. MEDICATIONS: New, Changed, or Stopped: Medication List  START taking these medications  Aspirin Low Dose 81 MG chewable tabletGeneric drug: aspirinTake 1 tablet (81 mg total) by mouth daily. carvedilol 3.125 mg tabletCommonly known as: COREGTake 1 tablet (3.125 mg total) by mouth 2 times daily (with meals). folic acid 1 mg tabletCommonly known as: FOLVITETake 1 tablet (1 mg total) by mouth daily.   Where to Get Your Medications  These medications were sent to [facility]  [address], [place] [address]  Hours: Mon-Fri 9A-5:30P, Sat-Sun 10A-2P Phone: [phone] Aspirin Low Dose 81 MG chewable tabletcarvedilol 3.125 mg tabletfolic acid 1 mg tablet Bernardino Forehand, PharmD, BCGP Transitions of Care Pharmacy [facility] Phone: [phone]

## 2024-04-05 ENCOUNTER — Ambulatory Visit: Payer: Medicare Other | Admitting: Surgery

## 2024-04-06 ENCOUNTER — Telehealth: Payer: Self-pay

## 2024-04-06 NOTE — Telephone Encounter (Signed)
 LMOM to confirm that pt wants to cancel FOV on 04/10/24 per Tele[employer] response.

## 2024-04-10 ENCOUNTER — Ambulatory Visit: Admitting: Cardiology
